# Patient Record
Sex: Male | Born: 1975 | Race: White | Hispanic: No | Marital: Married | Smoking: Never smoker
Health system: Southern US, Community
[De-identification: ages and names within clinical notes are randomized; demographics above are authoritative.]

## PROBLEM LIST (undated history)

## (undated) DIAGNOSIS — K219 Gastro-esophageal reflux disease without esophagitis: Secondary | ICD-10-CM

## (undated) DIAGNOSIS — E669 Obesity, unspecified: Secondary | ICD-10-CM

## (undated) DIAGNOSIS — J45909 Unspecified asthma, uncomplicated: Secondary | ICD-10-CM

## (undated) DIAGNOSIS — Z9109 Other allergy status, other than to drugs and biological substances: Secondary | ICD-10-CM

## (undated) DIAGNOSIS — K649 Unspecified hemorrhoids: Secondary | ICD-10-CM

## (undated) HISTORY — PX: WISDOM TOOTH EXTRACTION: SHX21

## (undated) HISTORY — DX: Unspecified hemorrhoids: K64.9

## (undated) HISTORY — DX: Obesity, unspecified: E66.9

## (undated) HISTORY — DX: Gastro-esophageal reflux disease without esophagitis: K21.9

## (undated) HISTORY — DX: Other allergy status, other than to drugs and biological substances: Z91.09

## (undated) HISTORY — DX: Unspecified asthma, uncomplicated: J45.909

---

## 2015-08-17 DIAGNOSIS — B351 Tinea unguium: Secondary | ICD-10-CM | POA: Diagnosis not present

## 2015-08-17 DIAGNOSIS — L308 Other specified dermatitis: Secondary | ICD-10-CM | POA: Diagnosis not present

## 2016-02-20 DIAGNOSIS — Z3189 Encounter for other procreative management: Secondary | ICD-10-CM | POA: Diagnosis not present

## 2016-05-21 DIAGNOSIS — J209 Acute bronchitis, unspecified: Secondary | ICD-10-CM | POA: Diagnosis not present

## 2016-05-25 DIAGNOSIS — Z20828 Contact with and (suspected) exposure to other viral communicable diseases: Secondary | ICD-10-CM | POA: Diagnosis not present

## 2016-05-25 DIAGNOSIS — R05 Cough: Secondary | ICD-10-CM | POA: Diagnosis not present

## 2016-05-25 DIAGNOSIS — J209 Acute bronchitis, unspecified: Secondary | ICD-10-CM | POA: Diagnosis not present

## 2016-08-15 DIAGNOSIS — R062 Wheezing: Secondary | ICD-10-CM | POA: Diagnosis not present

## 2016-08-15 DIAGNOSIS — J45990 Exercise induced bronchospasm: Secondary | ICD-10-CM | POA: Diagnosis not present

## 2016-08-15 DIAGNOSIS — J3089 Other allergic rhinitis: Secondary | ICD-10-CM | POA: Diagnosis not present

## 2016-08-15 DIAGNOSIS — J301 Allergic rhinitis due to pollen: Secondary | ICD-10-CM | POA: Diagnosis not present

## 2016-12-06 DIAGNOSIS — J3081 Allergic rhinitis due to animal (cat) (dog) hair and dander: Secondary | ICD-10-CM | POA: Diagnosis not present

## 2016-12-06 DIAGNOSIS — J45990 Exercise induced bronchospasm: Secondary | ICD-10-CM | POA: Diagnosis not present

## 2016-12-06 DIAGNOSIS — J3089 Other allergic rhinitis: Secondary | ICD-10-CM | POA: Diagnosis not present

## 2016-12-06 DIAGNOSIS — J301 Allergic rhinitis due to pollen: Secondary | ICD-10-CM | POA: Diagnosis not present

## 2017-01-10 DIAGNOSIS — Z3189 Encounter for other procreative management: Secondary | ICD-10-CM | POA: Diagnosis not present

## 2017-01-22 ENCOUNTER — Telehealth: Payer: Self-pay

## 2017-01-22 NOTE — Telephone Encounter (Signed)
Unable to reach patient at time of Pre Visit Call.   

## 2017-01-23 ENCOUNTER — Encounter: Payer: Self-pay | Admitting: Family Medicine

## 2017-01-23 ENCOUNTER — Ambulatory Visit (INDEPENDENT_AMBULATORY_CARE_PROVIDER_SITE_OTHER): Payer: BLUE CROSS/BLUE SHIELD | Admitting: Family Medicine

## 2017-01-23 VITALS — BP 204/126 | HR 95 | Temp 97.9°F | Resp 20 | Ht 69.75 in | Wt 396.2 lb

## 2017-01-23 DIAGNOSIS — R600 Localized edema: Secondary | ICD-10-CM | POA: Diagnosis not present

## 2017-01-23 DIAGNOSIS — R9431 Abnormal electrocardiogram [ECG] [EKG]: Secondary | ICD-10-CM

## 2017-01-23 DIAGNOSIS — Z6841 Body Mass Index (BMI) 40.0 and over, adult: Secondary | ICD-10-CM | POA: Insufficient documentation

## 2017-01-23 DIAGNOSIS — R0600 Dyspnea, unspecified: Secondary | ICD-10-CM | POA: Insufficient documentation

## 2017-01-23 DIAGNOSIS — Z7689 Persons encountering health services in other specified circumstances: Secondary | ICD-10-CM

## 2017-01-23 DIAGNOSIS — R0609 Other forms of dyspnea: Secondary | ICD-10-CM

## 2017-01-23 DIAGNOSIS — J45909 Unspecified asthma, uncomplicated: Secondary | ICD-10-CM | POA: Diagnosis not present

## 2017-01-23 DIAGNOSIS — I160 Hypertensive urgency: Secondary | ICD-10-CM

## 2017-01-23 DIAGNOSIS — I10 Essential (primary) hypertension: Secondary | ICD-10-CM | POA: Insufficient documentation

## 2017-01-23 DIAGNOSIS — Z800 Family history of malignant neoplasm of digestive organs: Secondary | ICD-10-CM | POA: Diagnosis not present

## 2017-01-23 HISTORY — DX: Family history of malignant neoplasm of digestive organs: Z80.0

## 2017-01-23 HISTORY — DX: Morbid (severe) obesity due to excess calories: E66.01

## 2017-01-23 HISTORY — DX: Essential (primary) hypertension: I10

## 2017-01-23 MED ORDER — ALBUTEROL SULFATE HFA 108 (90 BASE) MCG/ACT IN AERS: 2.0000 | INHALATION_SPRAY | Freq: Four times a day (QID) | RESPIRATORY_TRACT | 0 refills | Status: DC | PRN

## 2017-01-23 MED ORDER — IPRATROPIUM-ALBUTEROL 0.5-2.5 (3) MG/3ML IN SOLN
3.0000 mL | Freq: Once | RESPIRATORY_TRACT | Status: AC
Start: 2017-01-23 — End: 2017-01-23
  Administered 2017-01-23: 3 mL via RESPIRATORY_TRACT

## 2017-01-23 MED ORDER — AMLODIPINE BESYLATE 5 MG PO TABS: 5.0000 mg | ORAL_TABLET | Freq: Every day | ORAL | 0 refills | Status: DC

## 2017-01-23 MED ORDER — FUROSEMIDE 20 MG PO TABS: 20.0000 mg | ORAL_TABLET | Freq: Every day | ORAL | 0 refills | Status: DC

## 2017-01-23 NOTE — Patient Instructions (Addendum)
Baby aspirin (81 m) every day.  Start amlodipine 5 mg a day, this is not going to get you at goal... We need to decrease your pressure slowly (ideally in an ER). Please go to ER if any of the symptoms we discussed develop.  They will call you to schedule Echo of your heart.  Start lasix tomorrow morning.  [facility] x[facility].  Since you do not desire to go to emergency room, this will take multiple close visits and medication adjustments.     Hypertension Hypertension is another name for high blood pressure. High blood pressure forces your heart to work harder to pump blood. This can cause problems over time. There are two numbers in a blood pressure reading. There is a top number (systolic) over a bottom number (diastolic). It is best to have a blood pressure below 120/80. Healthy choices can help lower your blood pressure. You may need medicine to help lower your blood pressure if:  Your blood pressure cannot be lowered with healthy choices.  Your blood pressure is higher than 130/80.  Follow these instructions at home: Eating and drinking  If directed, follow the DASH eating plan. This diet includes: ? Filling half of your plate at each meal with fruits and vegetables. ? Filling one quarter of your plate at each meal with whole grains. Whole grains include whole wheat pasta, brown rice, and whole grain bread. ? Eating or drinking low-fat dairy products, such as skim milk or low-fat yogurt. ? Filling one quarter of your plate at each meal with low-fat (lean) proteins. Low-fat proteins include fish, skinless chicken, eggs, beans, and tofu. ? Avoiding fatty meat, cured and processed meat, or chicken with skin. ? Avoiding premade or processed food.  Eat less than 1,500 mg of salt (sodium) a day.  Limit alcohol use to no more than 1 drink a day for nonpregnant women and 2 drinks a day for men. One drink equals 12 oz of beer, 5 oz of wine, or 1 oz of hard  liquor. Lifestyle  Work with your doctor to stay at a healthy weight or to lose weight. Ask your doctor what the best weight is for you.  Get at least 30 minutes of exercise that causes your heart to beat faster (aerobic exercise) most days of the week. This may include walking, swimming, or biking.  Get at least 30 minutes of exercise that strengthens your muscles (resistance exercise) at least 3 days a week. This may include lifting weights or pilates.  Do not use any products that contain nicotine or tobacco. This includes cigarettes and e-cigarettes. If you need help quitting, ask your doctor.  Check your blood pressure at home as told by your doctor.  Keep all follow-up visits as told by your doctor. This is important. Medicines  Take over-the-counter and prescription medicines only as told by your doctor. Follow directions carefully.  Do not skip doses of blood pressure medicine. The medicine does not work as well if you skip doses. Skipping doses also puts you at risk for problems.  Ask your doctor about side effects or reactions to medicines that you should watch for. Contact a doctor if:  You think you are having a reaction to the medicine you are taking.  You have headaches that keep coming back (recurring).  You feel dizzy.  You have swelling in your ankles.  You have trouble with your vision. Get help right away if:  You get a very  bad headache.  You start to feel confused.  You feel weak or numb.  You feel faint.  You get very bad pain in your: ? Chest. ? Belly (abdomen).  You throw up (vomit) more than once.  You have trouble breathing. Summary  Hypertension is another name for high blood pressure.  Making healthy choices can help lower blood pressure. If your blood pressure cannot be controlled with healthy choices, you may need to take medicine. This information is not intended to replace advice given to you by your health care provider. Make sure  you discuss any questions you have with your health care provider. Document Released: 09/12/2007 Document Revised: 02/22/2016 Document Reviewed: 02/22/2016 Elsevier Interactive Patient Education  2018 [employer].   Ischemic Stroke An ischemic stroke (cerebrovascular accident, or CVA) is the sudden death of brain tissue that occurs when an area of the brain does not get enough oxygen. It is a medical emergency that must be treated right away. An ischemic stroke can cause permanent loss of brain function. This can cause problems with how different parts of your body function. What are the causes? This condition is caused by a decrease of oxygen supply to an area of the brain, which may be the result of:  A small blood clot (embolus) or a buildup of plaque in the blood vessels (atherosclerosis) that blocks blood flow in the brain.  An abnormal heart rhythm (atrial fibrillation).  A blocked or damaged artery in the head or neck.  What increases the risk? Certain factors may make you more likely to develop this condition. Some of these factors are things that you can change, such as:  Obesity.  Smoking cigarettes.  Taking oral birth control, especially if you also use tobacco.  Physical inactivity.  Excessive alcohol use.  Use of illegal drugs, especially cocaine and methamphetamine.  Other risk factors include:  High blood pressure (hypertension).  High cholesterol.  Diabetes mellitus.  Heart disease.  Being [place] American, Native [address], Hispanic, or [place] Native.  Being over age 34.  Family history of stroke.  Previous history of blood clots, stroke, or transient ischemic attack (TIA).  Sickle cell disease.  Being a woman with a history of preeclampsia.  Migraine headache.  Sleep apnea.  Irregular heartbeats, such as atrial fibrillation.  Chronic inflammatory diseases, such as rheumatoid arthritis or lupus.  Blood clotting disorders (hypercoagulable  state).  What are the signs or symptoms? Symptoms of this condition usually develop suddenly, or you may notice them after waking up from sleep. Symptoms may include sudden:  Weakness or numbness in your face, arm, or leg, especially on one side of your body.  Trouble walking or difficulty moving your arms or legs.  Loss of balance or coordination.  Confusion.  Slurred speech (dysarthria).  Trouble speaking, understanding speech, or both (aphasia).  Vision changes-such as double vision, blurred vision, or loss of vision-inone or both eyes.  Dizziness.  Nausea and vomiting.  Severe headache with no known cause. The headache is often described as the worst headache ever experienced.  If possible, make note of the exact time that you last felt like your normal self and what time your symptoms started. Tell your health care provider. If symptoms come and go, this could be a sign of a warning stroke, or TIA. Get help right away, even if you feel better. How is this diagnosed? This condition may be diagnosed based on:  Your symptoms, your medical history, and a physical exam.  CT scan of the brain.  MRI.  CT angiogram. This test uses a computer to take X-rays of your arteries. A dye may be injected into your blood to show the inside of your blood vessels more clearly.  MRI angiogram. This is a type of MRI that is used to evaluate the blood vessels.  Cerebral angiogram. This test uses X-rays and a dye to show the blood vessels in the brain and neck.  You may need to see a health care provider who specializes in stroke care. A stroke specialist can be seen in person or through communication using telephone or television technology (telemedicine). Other tests may also be done to find the cause of the stroke, such as:  Electrocardiogram (ECG).  Continuous heart monitoring.  Echocardiogram.  Carotid ultrasound.  A scan of the brain circulation.  Blood tests.  Sleep  study to check for sleep apnea.  How is this treated? Treatment for this condition will depend on the duration, severity, and cause of your symptoms and on the area of the brain affected. It is very important to get treatment at the first sign of stroke symptoms. Some treatments work better if they are done within 3-6 hours of the onset of stroke symptoms. These initial treatments may include:  Aspirin.  Medicines to control blood pressure.  Medicine given by injection to dissolve the blood clot (thrombolytic).  Treatments given directly to the affected artery to remove or dissolve the blood clot.  Other treatment options may include:  Oxygen.  IV fluids.  Medicines to thin the blood (anticoagulants or antiplatelets).  Procedures to increase blood flow.  Medicines and changes to your diet may be used to help treat and manage risk factors for stroke, such as diabetes, high cholesterol, and high blood pressure. After a stroke, you may work with physical, speech, mental health, or occupational therapists to help you recover. Follow these instructions at home: Medicines  Take over-the-counter and prescription medicines only as told by your health care provider.  If you were told to take a medicine to thin your blood, such as aspirin or an anticoagulant, take it exactly as told by your health care provider. ? Taking too much blood-thinning medicine can cause bleeding. ? If you do not take enough blood-thinning medicine, you will not have the protection that you need against another stroke and other problems.  Understand the side effects of taking anticoagulant medicine. When taking this type of medicine, make sure you: ? Hold pressure over any cuts for longer than usual. ? Tell your dentist and other health care providers that you are taking anticoagulants before you have any procedures that may cause bleeding. ? Avoid activities that may cause trauma or injury. Eating and  drinking  Follow instructions from your health care provider about diet.  Eat healthy foods.  If your ability to swallow was affected by the stroke, you may need to take steps to avoid choking, such as: ? Taking small bites when eating. ? Eating foods that are soft or pureed. Safety  Follow instructions from your health care team about physical activity.  Use a walker or cane as told by your health care provider.  Take steps to create a safe home environment in order to reduce the risk of falls. This may include: ? Having your home looked at by specialists. ? Installing grab bars in the bedroom and bathroom. ? Using safety equipment, such as raised toilets and a seat in the shower. General instructions  Do  not use any tobacco products, such as cigarettes, chewing tobacco, and e-cigarettes. If you need help quitting, ask your health care provider.  Limit alcohol intake to no more than 1 drink a day for nonpregnant women and 2 drinks a day for men. One drink equals 12 oz of beer, 5 oz of wine, or 1 oz of hard liquor.  If you need help to stop using drugs or alcohol, ask your health care provider about a referral to a program or specialist.  Maintain an active and healthy lifestyle. Get regular exercise as told by your health care provider.  Keep all follow-up visits as told by your health care provider, including visits with all specialists on your health care team. This is important. How is this prevented? Your risk of another stroke can be decreased by managing high blood pressure, high cholesterol, diabetes, heart disease, sleep apnea, and obesity. It can also be decreased by quitting smoking, limiting alcohol, and staying physically active. Your health care provider will continue to work with you on measures to prevent short-term and long-term complications of stroke. Get help right away if: You have:  Sudden weakness or numbness in your face, arm, or leg, especially on one  side of your body.  Sudden confusion.  Sudden trouble speaking, understanding, or both (aphasia).  Sudden trouble seeing with one or both eyes.  Sudden trouble walking or difficulty moving your arms or legs.  Sudden dizziness.  Sudden loss of balance or coordination.  Sudden, severe headache with no known cause.  A partial or total loss of consciousness.  A seizure. Any of these symptoms may represent a serious problem that is an emergency. Do not wait to see if the symptoms will go away. Get medical help right away. Call your local emergency services (911 in [place] Do not drive yourself to the hospital. This information is not intended to replace advice given to you by your health care provider. Make sure you discuss any questions you have with your health care provider. Document Released: 03/26/2005 Document Revised: 09/06/2015 Document Reviewed: 06/22/2015 Elsevier Interactive Patient Education  2017 [employer]    Acute Coronary Syndrome Acute coronary syndrome (ACS) is a serious problem in which there is suddenly not enough blood and oxygen supplied to the heart. ACS may mean that one or more of the blood vessels in your heart (coronary arteries) may be blocked. ACS can result in chest pain or a heart attack (myocardial infarction or MI). What are the causes? This condition is caused by atherosclerosis, which is the buildup of fat and cholesterol (plaque) on the inside of the arteries. Over time, the plaque may narrow or block the artery, and this will lessen blood flow to the heart. Plaque can also become weak and break off within a coronary artery to form a clot and cause a sudden blockage. What increases the risk? The risk factors of this condition include:  High cholesterol levels.  High blood pressure (hypertension).  Smoking.  Diabetes.  Age.  Family history of chest pain, heart disease, or stroke.  Lack of exercise.  What are the signs or symptoms? The  most common signs of this condition include:  Chest pain, which can be: ? A crushing or squeezing in the chest. ? A tightness, pressure, fullness, or heaviness in the chest. ? Present for more than a few minutes, or it can stop and recur.  Pain in the arms, neck, jaw, or back.  Unexplained heartburn or indigestion.  Shortness of  breath.  Nausea.  Sudden cold sweats.  Feeling light-headed or dizzy.  Sometimes, this condition has no symptoms. How is this diagnosed? ACS may be diagnosed through the following tests:  Electrocardiogram (ECG).  Blood tests.  Coronary angiogram. This is a procedure to look at the coronary arteries to see if there is any blockage.  How is this treated? Treatment for ACS may include:  Healthy behavioral changes to reduce or control risk factors.  Medicine.  Coronary stenting.A stent helps to keep an artery open.  Coronary angioplasty. This procedure widens a narrowed or blocked artery.  Coronary artery bypass surgery. This will allow your blood to pass the blockage (bypass) to reach your heart.  Follow these instructions at home: Eating and drinking  Follow a heart-healthy diet. A dietitian can you help to educate you about healthy food options and changes.  Use healthy cooking methods such as roasting, grilling, broiling, baking, poaching, steaming, or stir-frying. Talk to a dietitian to learn more about healthy cooking methods. Medicines  Take medicines only as directed by your health care provider.  Do not take the following medicines unless your health care provider approves: ? Nonsteroidal anti-inflammatory drugs (NSAIDs), such as ibuprofen, naproxen, or celecoxib. ? Vitamin supplements that contain vitamin A, vitamin E, or both. ? Hormone replacement therapy that contains estrogen with or without progestin.  Stop illegal drug use. Activity  Follow an exercise program that is approved by your health care provider.  Plan rest  periods when you are fatigued. Lifestyle  Do not use any tobacco products, including cigarettes, chewing tobacco, or electronic cigarettes. If you need help quitting, ask your health care provider.  If you drink alcohol, and your health care provider approves, limit your alcohol intake to no more than 1 drink per day. One drink equals 12 ounces of beer, 5 ounces of wine, or 1 ounces of hard liquor.  Learn to manage stress.  Maintain a healthy weight. Lose weight as approved by your health care provider. General instructions  Manage other health conditions, such as hypertension and diabetes, as directed by your health care provider.  Keep all follow-up visits as directed by your health care provider. This is important.  Your health care provider may ask you to monitor your blood pressure. A blood pressure reading consists of a higher number over a lower number, such as 110 over 72, written as 110/72. Ideally, your blood pressure should be: ? Below 140/90 if you have no other medical conditions. ? Below 130/80 if you have diabetes or kidney disease. Get help right away if:  You have pain in your chest, neck, arm, jaw, stomach, or back that lasts more than a few minutes, is recurring, or is not relieved by taking medicine under your tongue (sublingual nitroglycerin).  You have profuse sweating without cause.  You have unexplained: ? Heartburn or indigestion. ? Shortness of breath or difficulty breathing. ? Nausea or vomiting. ? Fatigue. ? Feelings of nervousness or anxiety. ? Weakness. ? Diarrhea.  You have sudden light-headedness or dizziness.  You faint. These symptoms may represent a serious problem that is an emergency. Do not wait to see if the symptoms will go away. Get medical help right away. Call your local emergency services (911 in the [place] Do not drive yourself to the clinic or hospital. This information is not intended to replace advice given to you by your health  care provider. Make sure you discuss any questions you have with your health care provider. Document  Released: 03/26/2005 Document Revised: 09/07/2015 Document Reviewed: 07/28/2013 Elsevier Interactive Patient Education  2017 [employer].

## 2017-01-23 NOTE — Progress Notes (Signed)
Patient ID: Darren Carlson, male  DOB: Aug 30, 1975, 41 y.o.   MRN: [id number] Patient Care Team    Relationship Specialty Notifications Start End  Ma Hillock, DO PCP - General Family Medicine  01/23/17     Chief Complaint  Patient presents with  . Establish Care  . Asthma    Subjective:  Maico Mulvehill is a 41 y.o.  male present for new patient establishment. All past medical history, surgical history, allergies, family history, immunizations, medications and social history were obtained and entered in the electronic medical record today. All recent labs, ED visits and hospitalizations within the last year were reviewed. Patient moved from [place] over the summer with his family.  Patient presents today with new patient establishment with reports of dyspnea on exertion that has increased since summer. He and his family have moved into a new home in the area, and they'll started to become ill. They had to have their duct work cleaned, carpets removed and house inspected. After these were completed he felt that he was improving, but still with symptoms. He was also treated for severe bronchitis at that time. No chest x-ray obtained. He has never had blood pressure issues in the past. He states it has been borderline at times but not consistently high. He has been exercising pretty routinely without chest pain, dizziness, syncope or headache. He admits to lower extremity edema and dyspnea on exertion. He contributed to his allergies and asthma. He has been seeing an asthma specialist and started on Qvar 80 1 puff twice a day. He was also started on Prilosec, which he states he is not taking. He was investigated chest x-ray, but admits he has not done so yet. He is using dymista and Flonase, he is not taking a oral allergy regimen. He is aware he is extremely overweight, and he has been working out attempting to lose weight. He reports he has steadily lost inches over the last few months. He  brings no records with him today, and his had no recent work to report.  Family history colon cancer : Patient has a rather significant family history of colon cancer in his grandmother in her early 35s, and his aunt in her 44s.   Depression screen PHQ 2/9 01/23/2017  Decreased Interest 0  Down, Depressed, Hopeless 0  PHQ - 2 Score 0   No flowsheet data found.    Current Exercise Habits: Structured exercise class, Type of exercise: strength training/weights, Time (Minutes): 40, Frequency (Times/Week): 2, Weekly Exercise (Minutes/Week): 80, Intensity: Mild   No flowsheet data found.    There is no immunization history on file for this patient.  No exam data present  Past Medical History:  Diagnosis Date  . Asthma   . Environmental allergies   . GERD (gastroesophageal reflux disease)   . Obesity    Allergies  Allergen Reactions  . Amoxicillin Rash  . Penicillins Rash   Past Surgical History:  Procedure Laterality Date  . WISDOM TOOTH EXTRACTION     Family History  Problem Relation Age of Onset  . Colon cancer Maternal Grandmother 37       early death  . Diabetes Paternal Grandmother   . Kidney disease Paternal Grandmother   . Diabetes Paternal Grandfather   . Kidney disease Paternal Grandfather   . Colon cancer Maternal Aunt 61   Social History   Social History  . Marital status: Married    Spouse name: [name]  . Number of  children: 2  . Years of education: 37   Occupational History  . restaurant owner    Social History Main Topics  . Smoking status: Never Smoker  . Smokeless tobacco: Never Used  . Alcohol use Yes     Comment: occasional  . Drug use: No  . Sexual activity: Yes    Partners: Female    Birth control/ protection: None   Other Topics Concern  . Not on file   Social History Narrative   Married to Hughson, 2 children.    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 Drinks caffeine.    Smoke alarm in  the home. Wears seatbelt.    Feels safe in his relationships.       Allergies as of 01/23/2017      Reactions   Amoxicillin Rash   Penicillins Rash      Medication List       Accurate as of 01/23/17  5:49 PM. Always use your most recent med list.          albuterol 108 (90 Base) MCG/ACT inhaler Commonly known as:  PROVENTIL HFA;VENTOLIN HFA Inhale 2 puffs into the lungs every 6 (six) hours as needed for wheezing or shortness of breath.   amLODipine 5 MG tablet Commonly known as:  NORVASC Take 1 tablet (5 mg total) by mouth daily.   DYMISTA 137-50 MCG/ACT Susp Generic drug:  Azelastine-Fluticasone USE 1 SPRAY INTO EACH NOSTRIL TWICE A DAY   furosemide 20 MG tablet Commonly known as:  LASIX Take 1 tablet (20 mg total) by mouth daily.   omeprazole 20 MG capsule Commonly known as:  PRILOSEC TAKE 1 CAPSULE ONCE A DAY 1/2 HOUR PRIOR TO BREAKFAST ORALLY 30 DAY(S)   QVAR REDIHALER 80 MCG/ACT inhaler Generic drug:  beclomethasone TAKE 1 PUFF BY MOUTH TWICE A DAY       All past medical history, surgical history, allergies, family history, immunizations andmedications were updated in the EMR today and reviewed under the history and medication portions of their EMR.    No results found for this or any previous visit (from the past 2160 hour(s)).  Patient was never admitted.   ROS: 14 pt review of systems performed and negative (unless mentioned in an HPI)  Objective: BP (!) 204/126 (BP Location: Right Arm, Patient Position: Sitting, Cuff Size: Large)   Pulse 95   Temp 97.9 F (36.6 C)   Resp 20   Ht 5' 9.75" (1.772 m)   Wt (!) 396 lb 4 oz (179.7 kg)   SpO2 96%   BMI 57.26 kg/m  Gen: Afebrile. No acute distress. Nontoxic in appearance, well-developed, well-nourished,  Morbidly obese, pleasant Caucasian male. HENT: AT. [place]. MMM, no oral lesions, adequate dentition. Bilateral nares within normal limits. Throat without erythema, ulcerations or exudates. Mild Cough on  exam, no hoarseness on exam. Eyes:Pupils Equal Round Reactive to light, Extraocular movements intact,  Conjunctiva without redness, discharge or icterus. Neck/lymp/endocrine: Supple, no lymphadenopathy, no thyromegaly CV: RRR, No murmur, +2 edema. no carotid bruits, JVD. Chest: CTAB, no wheeze, rhonchi or crackles. Normal Respiratory effort. Diminished Air movement. Abd: Soft. Obese. NTND. BS present.  Skin: Warm and well-perfused. Skin intact. Hyperpigmentation bilateral ankles/lower extremity. Neuro/Msk:  Normal gait. PERLA. EOMi. Alert. Oriented x3.   Psych: Normal affect, dress and demeanor. Normal speech. Normal thought content and judgment. EKG: SR.  Heart rate 100. PR 154, QT 386. Nonspecific T-wave out normality. No ST elevations. Atrial enlargement. Right-sided  conduction defect, right axis, possible right ventricular hypertrophy or posterior fascicular block  Assessment/plan: Orlando Devereux is a 41 y.o. male present for establishment visit with  Dyspnea on exertion/Hypertensive urgency/Abnormal EKG/Morbid obesity with BMI of 50.0-59.9, adult [facility]) Patient presents with complaints of dyspnea on exertion with an new onset elevated blood pressure ranging in hypertensive urgency criteria. He was asymptomatic, with the exception of his shortness of breath with exertion. He actually has been exercising a few times a week and has tolerated. Patient is fluid overloaded on exam today. Strongly encouraged him to be treated in the emergency room for hypertension urgency and dyspnea on exertion, and he declined. Patient was educated blood pressure needs to be slowly decreased and this is difficult to do in outpatient setting, with someone who is never been on blood pressure medications. He will need very close follow-up, labs, echocardiogram and chest x-ray. Will refer to cardiology for abnormal EKG after echocardiogram is completed. - low sodium diet, avoid caffeine.  - exercise routinely, after BP  better controlled. Consider Contrave if interested in weight loss assistance. - Start amlodipine 5 mg daily today. Patient to be seen in 2 days provider appointment and will increase at that time. - Start Lasix 20 mg daily tomorrow morning. - Will eventually need sleep study after blood pressure and fluid overload better controlled. - CBC w/Diff - HgB A1c - Comp Met (CMET) - TSH - DG Chest 2 View; Future - ECHOCARDIOGRAM COMPLETE; Future - Direct LDL - EKG 12-Lead--> abnormal. Echo ordered. Referral to cardio for full DOE evaluation and likely stress test. Given he has declined to be seen in ED, will attempt to obtain studies outpt quickly and have BP better controlled before cardio eval.  - Patient was educated on his abnormalities of his EKG, blood pressure and fluid retention with dyspnea on exertion can cause a strain to the heart causing heart attack or stroke. Patient is aware of the signs and symptoms and will report to the emergency room if needed. Strongly encouraged him to take his condition seriously, as it can be life threatening.  Moderate asthma without complication, unspecified whether persistent - Patient's dyspnea on exertion is felt to be due to uncontrolled hypertension, fluid overload and likely heart disease. However he does have some component of asthma which responded to an albuterol treatment in the office today. - Continue Flonase - Continue Qvar 1 puff twice a day, may need to increase to 2 puffs twice a day. Patient's blood pressure and fluid status needs to be corrected, then can revisit his response to Qvar. - May need to consider Xyzal and Singulair addition. - Albuterol inhaler prescribed patient is to use before exercise and when necessary for wheezing or shortness of breath. - ipratropium-albuterol (DUONEB) 0.5-2.5 (3) MG/3ML nebulizer solution 3 mL; Take 3 mLs by nebulization once.  Family history colon cancer: Patient will need early screening should be  completed as soon as possible. Discussed referral with him today and we will place after cardiac condition is better controlled.   Return in about 2 days (around 01/25/2017) for HTN'.  Greater than 60 minutes was spent with patient, greater than 50% of that time was spent face-to-face with patient counseling and/or coordinating care.    Note is dictated utilizing voice recognition software. Although note has been proof read prior to signing, occasional typographical errors still can be missed. If any questions arise, please do not hesitate to call for verification.  Electronically signed by: Felix Pacini, DO [place]  [facility] [place]

## 2017-01-24 ENCOUNTER — Telehealth: Payer: Self-pay | Admitting: Family Medicine

## 2017-01-24 ENCOUNTER — Ambulatory Visit (HOSPITAL_BASED_OUTPATIENT_CLINIC_OR_DEPARTMENT_OTHER)
Admission: RE | Admit: 2017-01-24 | Discharge: 2017-01-24 | Disposition: A | Discharge status: Home / Self Care | Payer: BLUE CROSS/BLUE SHIELD | Source: Ambulatory Visit | Attending: Family Medicine | Admitting: Family Medicine

## 2017-01-24 DIAGNOSIS — I517 Cardiomegaly: Secondary | ICD-10-CM | POA: Insufficient documentation

## 2017-01-24 DIAGNOSIS — R600 Localized edema: Secondary | ICD-10-CM | POA: Insufficient documentation

## 2017-01-24 DIAGNOSIS — I160 Hypertensive urgency: Secondary | ICD-10-CM | POA: Insufficient documentation

## 2017-01-24 DIAGNOSIS — R0609 Other forms of dyspnea: Secondary | ICD-10-CM | POA: Insufficient documentation

## 2017-01-24 DIAGNOSIS — R0602 Shortness of breath: Secondary | ICD-10-CM | POA: Diagnosis not present

## 2017-01-24 DIAGNOSIS — R0600 Dyspnea, unspecified: Secondary | ICD-10-CM

## 2017-01-24 LAB — HEMOGLOBIN A1C
HEMOGLOBIN A1C: 6.4 %{Hb} — AB (ref <5.7)
Mean Plasma Glucose: 137 (calc)
eAG (mmol/L): 7.6 (calc)

## 2017-01-24 LAB — COMPREHENSIVE METABOLIC PANEL
AG RATIO: 1.7 (calc) (ref 1.0–2.5)
ALT: 20 U/L (ref 9–46)
AST: 18 U/L (ref 10–40)
Albumin: 4.4 g/dL (ref 3.6–5.1)
Alkaline phosphatase (APISO): 45 U/L (ref 40–115)
BUN: 19 mg/dL (ref 7–25)
CALCIUM: 9.6 mg/dL (ref 8.6–10.3)
CHLORIDE: 99 mmol/L (ref 98–110)
CO2: 32 mmol/L (ref 20–32)
Creat: 1.34 mg/dL (ref 0.60–1.35)
GLUCOSE: 127 mg/dL — AB (ref 65–99)
Globulin: 2.6 g/dL (calc) (ref 1.9–3.7)
Potassium: 3.3 mmol/L — ABNORMAL LOW (ref 3.5–5.3)
Sodium: 139 mmol/L (ref 135–146)
TOTAL PROTEIN: 7 g/dL (ref 6.1–8.1)
Total Bilirubin: 1.3 mg/dL — ABNORMAL HIGH (ref 0.2–1.2)

## 2017-01-24 LAB — CBC WITH DIFFERENTIAL/PLATELET
BASOS PCT: 0.4 %
Basophils Absolute: 45 cells/uL (ref 0–200)
EOS ABS: 147 {cells}/uL (ref 15–500)
Eosinophils Relative: 1.3 %
HCT: 37 % — ABNORMAL LOW (ref 38.5–50.0)
HEMOGLOBIN: 12.4 g/dL — AB (ref 13.2–17.1)
LYMPHS ABS: 1458 {cells}/uL (ref 850–3900)
MCH: 26.8 pg — AB (ref 27.0–33.0)
MCHC: 33.5 g/dL (ref 32.0–36.0)
MCV: 80.1 fL (ref 80.0–100.0)
MONOS PCT: 5 %
MPV: 12.2 fL (ref 7.5–12.5)
NEUTROS ABS: 9085 {cells}/uL — AB (ref 1500–7800)
Neutrophils Relative %: 80.4 %
Platelets: 199 10*3/uL (ref 140–400)
RBC: 4.62 10*6/uL (ref 4.20–5.80)
RDW: 14.9 % (ref 11.0–15.0)
Total Lymphocyte: 12.9 %
WBC: 11.3 10*3/uL — ABNORMAL HIGH (ref 3.8–10.8)
WBCMIX: 565 {cells}/uL (ref 200–950)

## 2017-01-24 LAB — TSH: TSH: 2.13 m[IU]/L (ref 0.40–4.50)

## 2017-01-24 LAB — LDL CHOLESTEROL, DIRECT: LDL DIRECT: 157 mg/dL — AB (ref <100)

## 2017-01-24 MED ORDER — POTASSIUM CHLORIDE ER 20 MEQ PO TBCR: EXTENDED_RELEASE_TABLET | ORAL | 0 refills | Status: DC

## 2017-01-24 NOTE — Telephone Encounter (Signed)
Please call pt: - I have started to receive his labs and cxr results. We will discucss everything in detail tomorrow. However his potassium is low, and the lasix will make it lower, so we need to start a potassium supplement today. I have called this medication in for him.

## 2017-01-24 NOTE — Telephone Encounter (Signed)
Left message with information and instructions on patient voice mail. 

## 2017-01-25 ENCOUNTER — Encounter: Payer: Self-pay | Admitting: Family Medicine

## 2017-01-25 ENCOUNTER — Ambulatory Visit (INDEPENDENT_AMBULATORY_CARE_PROVIDER_SITE_OTHER): Payer: BLUE CROSS/BLUE SHIELD | Admitting: Family Medicine

## 2017-01-25 ENCOUNTER — Emergency Department (HOSPITAL_BASED_OUTPATIENT_CLINIC_OR_DEPARTMENT_OTHER)
Admission: EM | Admit: 2017-01-25 | Discharge: 2017-01-25 | Disposition: A | Discharge status: Home / Self Care | Payer: BLUE CROSS/BLUE SHIELD | Attending: Emergency Medicine | Admitting: Emergency Medicine

## 2017-01-25 VITALS — BP 209/122 | HR 93 | Temp 98.3°F | Resp 20 | Ht 70.0 in | Wt 392.0 lb

## 2017-01-25 DIAGNOSIS — R0600 Dyspnea, unspecified: Secondary | ICD-10-CM

## 2017-01-25 DIAGNOSIS — I517 Cardiomegaly: Secondary | ICD-10-CM

## 2017-01-25 DIAGNOSIS — E7800 Pure hypercholesterolemia, unspecified: Secondary | ICD-10-CM | POA: Diagnosis not present

## 2017-01-25 DIAGNOSIS — I160 Hypertensive urgency: Secondary | ICD-10-CM | POA: Diagnosis not present

## 2017-01-25 DIAGNOSIS — R9431 Abnormal electrocardiogram [ECG] [EKG]: Secondary | ICD-10-CM

## 2017-01-25 DIAGNOSIS — J45909 Unspecified asthma, uncomplicated: Secondary | ICD-10-CM | POA: Insufficient documentation

## 2017-01-25 DIAGNOSIS — I10 Essential (primary) hypertension: Secondary | ICD-10-CM

## 2017-01-25 DIAGNOSIS — E876 Hypokalemia: Secondary | ICD-10-CM | POA: Insufficient documentation

## 2017-01-25 DIAGNOSIS — Z7984 Long term (current) use of oral hypoglycemic drugs: Secondary | ICD-10-CM | POA: Insufficient documentation

## 2017-01-25 DIAGNOSIS — Z7982 Long term (current) use of aspirin: Secondary | ICD-10-CM | POA: Insufficient documentation

## 2017-01-25 DIAGNOSIS — Z79899 Other long term (current) drug therapy: Secondary | ICD-10-CM | POA: Insufficient documentation

## 2017-01-25 DIAGNOSIS — R7303 Prediabetes: Secondary | ICD-10-CM

## 2017-01-25 DIAGNOSIS — Z6841 Body Mass Index (BMI) 40.0 and over, adult: Secondary | ICD-10-CM

## 2017-01-25 DIAGNOSIS — R0609 Other forms of dyspnea: Secondary | ICD-10-CM

## 2017-01-25 DIAGNOSIS — R600 Localized edema: Secondary | ICD-10-CM | POA: Diagnosis not present

## 2017-01-25 LAB — MICROALBUMIN / CREATININE URINE RATIO
Creatinine,U: 247.1 mg/dL
MICROALB/CREAT RATIO: 43.3 mg/g — AB (ref 0.0–30.0)
Microalb, Ur: 106.9 mg/dL — ABNORMAL HIGH (ref 0.0–1.9)

## 2017-01-25 LAB — BRAIN NATRIURETIC PEPTIDE: Pro B Natriuretic peptide (BNP): 302 pg/mL — ABNORMAL HIGH (ref 0.0–100.0)

## 2017-01-25 MED ORDER — METFORMIN HCL 500 MG PO TABS: 500.0000 mg | ORAL_TABLET | Freq: Two times a day (BID) | ORAL | 1 refills | Status: DC

## 2017-01-25 MED ORDER — AZITHROMYCIN 250 MG PO TABS: ORAL_TABLET | ORAL | 0 refills | Status: DC

## 2017-01-25 MED ORDER — CLONIDINE HCL 0.1 MG PO TABS
0.2000 mg | ORAL_TABLET | Freq: Once | ORAL | Status: AC
  Administered 2017-01-25: 0.2 mg via ORAL
  Filled 2017-01-25: qty 2

## 2017-01-25 MED ORDER — AMLODIPINE BESYLATE 10 MG PO TABS: 10.0000 mg | ORAL_TABLET | Freq: Every day | ORAL | 1 refills | Status: DC

## 2017-01-25 MED ORDER — LISINOPRIL 20 MG PO TABS: 20.0000 mg | ORAL_TABLET | Freq: Every day | ORAL | 3 refills | Status: DC

## 2017-01-25 NOTE — ED Provider Notes (Signed)
[facility] EMERGENCY DEPARTMENT Provider Note   CSN: [id number] date & time: 01/25/17  1613     History   Chief Complaint Chief Complaint  Patient presents with  . Hypertension    HPI Darren Carlson is a 41 y.o. male.  HPI 41 year old male who presents with elevated blood pressure. He has a history of asthma, GERD and obesity. He has been receiving outpatient management for his high blood pressure this week through his primary care doctor's office. They started him on Lasix 40 mg by mouth and 5 mg amlodipine 2 days ago for elevated blood pressures in the 180s to 200s systolic.He returned to the office today and his blood pressure was 200 systolic over 120s and he was sent to the ED for evaluation. He was also is increased on the amlodipine to 10 mg and started on lisinopril 10 mg. States that he has had a mild headache but denies any nausea vomiting, focal numbness weakness, difficulty breathing, chest pain, difficulty with urination, lower extremity edema.  Had normal CXR and blood work 2 days ago per patient.   Past Medical History:  Diagnosis Date  . Asthma   . Environmental allergies   . GERD (gastroesophageal reflux disease)   . Obesity     Patient Active Problem List   Diagnosis Date Noted  . Cardiomegaly 01/25/2017  . Prediabetes 01/25/2017  . Hypokalemia 01/25/2017  . Morbid obesity with BMI of 50.0-59.9, adult (HCC) 01/23/2017  . Dyspnea on exertion 01/23/2017  . Hypertensive urgency 01/23/2017  . Abnormal EKG 01/23/2017  . Bilateral lower extremity edema 01/23/2017  . Family history of colon cancer 01/23/2017  . Moderate asthma without complication     Past Surgical History:  Procedure Laterality Date  . WISDOM TOOTH EXTRACTION         Home Medications    Prior to Admission medications   Medication Sig Start Date End Date Taking? Authorizing Provider  albuterol (PROVENTIL HFA;VENTOLIN HFA) 108 (90 Base) MCG/ACT inhaler Inhale 2  puffs into the lungs every 6 (six) hours as needed for wheezing or shortness of breath. 01/23/17   Kuneff, Renee A, DO  amLODipine (NORVASC) 10 MG tablet Take 1 tablet (10 mg total) by mouth daily. 01/25/17   Kuneff, Renee A, DO  aspirin EC 81 MG tablet Take 81 mg by mouth daily.    [provider]  azithromycin (ZITHROMAX) 250 MG tablet 500 mg , 250 mg QD 01/25/17   Kuneff, Renee A, DO  DYMISTA 137-50 MCG/ACT SUSP USE 1 SPRAY INTO EACH NOSTRIL TWICE A DAY 12/07/16   [provider]  furosemide (LASIX) 20 MG tablet Take 1 tablet (20 mg total) by mouth daily. 01/23/17   Kuneff, Renee A, DO  lisinopril (PRINIVIL,ZESTRIL) 20 MG tablet Take 1 tablet (20 mg total) by mouth daily. 01/25/17   Kuneff, Renee A, DO  metFORMIN (GLUCOPHAGE) 500 MG tablet Take 1 tablet (500 mg total) by mouth 2 (two) times daily with a meal. 01/25/17   Kuneff, Renee A, DO  omeprazole (PRILOSEC) 20 MG capsule TAKE 1 CAPSULE ONCE A DAY 1/2 HOUR PRIOR TO BREAKFAST ORALLY 30 DAY(S) 12/06/16   [provider]  potassium chloride 20 MEQ TBCR 2 tabs daily for 4 days, then one tab daily. 01/24/17   Kuneff, Renee A, DO  QVAR REDIHALER 80 MCG/ACT inhaler TAKE 1 PUFF BY MOUTH TWICE A DAY 12/18/16   [provider]    Family History Family History  Problem Relation Age  of Onset  . Colon cancer Maternal Grandmother 60       early death  . Diabetes Paternal Grandmother   . Kidney disease Paternal Grandmother   . Diabetes Paternal Grandfather   . Kidney disease Paternal Grandfather   . Colon cancer Maternal Aunt 83    Social History Social History  Substance Use Topics  . Smoking status: Never Smoker  . Smokeless tobacco: Never Used  . Alcohol use Yes     Comment: occasional     Allergies   Amoxicillin and Penicillins   Review of Systems Review of Systems  Constitutional: Negative for fever.  Respiratory: Negative for shortness of breath.   Cardiovascular: Negative for chest pain.    Gastrointestinal: Negative for abdominal pain.  All other systems reviewed and are negative.    Physical Exam Updated Vital Signs BP (!) 155/87   Pulse 79   Temp 98.4 F (36.9 C) (Oral)   Resp (!) 23   Ht 5\' 10"  (1.778 m)   Wt (!) 177.8 kg (392 lb)   SpO2 97%   BMI 56.25 kg/m   Physical Exam Physical Exam  Nursing note and vitals reviewed. Constitutional: Well developed, well nourished, non-toxic, and in no acute distress Head: Normocephalic and atraumatic.  Mouth/Throat: Oropharynx is clear and moist.  Neck: Normal range of motion. Neck supple.  Cardiovascular: Normal rate and regular rhythm.   Pulmonary/Chest: Effort normal and breath sounds normal.  Abdominal: Soft. Obese There is no tenderness. There is no rebound and no guarding.  Musculoskeletal: Normal range of motion.  Neurological: Alert, no facial droop, fluent speech, moves all extremities symmetrically Skin: Skin is warm and dry.  Psychiatric: Cooperative   ED Treatments / Results  Labs (all labs ordered are listed, but only abnormal results are displayed) Labs Reviewed - No data to display  EKG  EKG Interpretation  Date/Time:  Friday January 25 2017 16:26:36 EDT Ventricular Rate:  85 PR Interval:  152 QRS Duration: 88 QT Interval:  412 QTC Calculation: 490 R Axis:   -61 Text Interpretation:  Normal sinus rhythm Possible Left atrial enlargement Left axis deviation Abnormal ECG Confirmed by Crista Curb [phone]) on 01/25/2017 7:45:49 PM       Radiology Dg Chest 2 View  Result Date: 01/24/2017 CLINICAL DATA:  Shortness of breath. EXAM: CHEST  2 VIEW COMPARISON:  None. FINDINGS: Mild cardiomegaly is noted. No pneumothorax or pleural effusion is noted. Both lungs are clear. The visualized skeletal structures are unremarkable. IMPRESSION: Mild cardiomegaly.  No acute cardiopulmonary abnormality seen. Electronically Signed   By: Lupita Raider, M.D.   On: 01/24/2017 10:21    Procedures Procedures  (including critical care time)  Medications Ordered in ED Medications  cloNIDine (CATAPRES) tablet 0.2 mg (0.2 mg Oral Given 01/25/17 1828)     Initial Impression / Assessment and Plan / ED Course  I have reviewed the triage vital signs and the nursing notes.  Pertinent labs & imaging results that were available during my care of the patient were reviewed by me and considered in my medical decision making (see chart for details).     Presenting with elevated blood pressures. He has been getting management by the PCP just increased on his blood pressure medications earlier today. I suspect that he needs longer period of time until his blood pressure medications kick in. This time he is overall asymptomatic. He has a follow-up appointment on Tuesday with the cardiologist and primary care doctor for recheck. They can  continue to manage his blood pressure. Given single dose clonidine on arrival, improved BP. Will discharge home Strict return and follow-up instructions reviewed. He expressed understanding of all discharge instructions and felt comfortable with the plan of care.   Final Clinical Impressions(s) / ED Diagnoses   Final diagnoses:  Essential hypertension    New Prescriptions New Prescriptions   No medications on file     Lavera Guise, MD 01/25/17 2011

## 2017-01-25 NOTE — ED Triage Notes (Signed)
Saw his MD today for new onset HTN diagnosed last week. His BP was elevated in the office again today and he was told to come to the ED for further evaluation. More new medications were added today. He is very anxious on arrival to triage. Slight headache is his only symptom. 1/10.

## 2017-01-25 NOTE — Telephone Encounter (Signed)
Note opened in error.

## 2017-01-25 NOTE — Patient Instructions (Addendum)
Increase amlodipine to a total of 10 mg (together) a day today. Start lisinopril 1 tab daily tomorrow.  Lasix every 12 hours for 3 days, then once a day only.  Potassium supplement every 12 hours for 4 days then once a day. Start metformin every 12 hours.   Fasting labs and followup recommended Monday or Tuesday next week.    Of course any worsening symptoms, shortness of breath, chest pain, headache dizziness etc go straight to ED.   Recommendations are you go to ED now to be treated appropriately for your hypertensive urgency.    Acute Coronary Syndrome Acute coronary syndrome (ACS) is a serious problem in which there is suddenly not enough blood and oxygen supplied to the heart. ACS may mean that one or more of the blood vessels in your heart (coronary arteries) may be blocked. ACS can result in chest pain or a heart attack (myocardial infarction or MI). What are the causes? This condition is caused by atherosclerosis, which is the buildup of fat and cholesterol (plaque) on the inside of the arteries. Over time, the plaque may narrow or block the artery, and this will lessen blood flow to the heart. Plaque can also become weak and break off within a coronary artery to form a clot and cause a sudden blockage. What increases the risk? The risk factors of this condition include:  High cholesterol levels.  High blood pressure (hypertension).  Smoking.  Diabetes.  Age.  Family history of chest pain, heart disease, or stroke.  Lack of exercise.  What are the signs or symptoms? The most common signs of this condition include:  Chest pain, which can be: ? A crushing or squeezing in the chest. ? A tightness, pressure, fullness, or heaviness in the chest. ? Present for more than a few minutes, or it can stop and recur.  Pain in the arms, neck, jaw, or back.  Unexplained heartburn or indigestion.  Shortness of breath.  Nausea.  Sudden cold sweats.  Feeling light-headed or  dizzy.  Sometimes, this condition has no symptoms. How is this diagnosed? ACS may be diagnosed through the following tests:  Electrocardiogram (ECG).  Blood tests.  Coronary angiogram. This is a procedure to look at the coronary arteries to see if there is any blockage.  How is this treated? Treatment for ACS may include:  Healthy behavioral changes to reduce or control risk factors.  Medicine.  Coronary stenting.A stent helps to keep an artery open.  Coronary angioplasty. This procedure widens a narrowed or blocked artery.  Coronary artery bypass surgery. This will allow your blood to pass the blockage (bypass) to reach your heart.  Follow these instructions at home: Eating and drinking  Follow a heart-healthy diet. A dietitian can you help to educate you about healthy food options and changes.  Use healthy cooking methods such as roasting, grilling, broiling, baking, poaching, steaming, or stir-frying. Talk to a dietitian to learn more about healthy cooking methods. Medicines  Take medicines only as directed by your health care provider.  Do not take the following medicines unless your health care provider approves: ? Nonsteroidal anti-inflammatory drugs (NSAIDs), such as ibuprofen, naproxen, or celecoxib. ? Vitamin supplements that contain vitamin A, vitamin E, or both. ? Hormone replacement therapy that contains estrogen with or without progestin.  Stop illegal drug use. Activity  Follow an exercise program that is approved by your health care provider.  Plan rest periods when you are fatigued. Lifestyle  Do not use any tobacco  products, including cigarettes, chewing tobacco, or electronic cigarettes. If you need help quitting, ask your health care provider.  If you drink alcohol, and your health care provider approves, limit your alcohol intake to no more than 1 drink per day. One drink equals 12 ounces of beer, 5 ounces of wine, or 1 ounces of hard  liquor.  Learn to manage stress.  Maintain a healthy weight. Lose weight as approved by your health care provider. General instructions  Manage other health conditions, such as hypertension and diabetes, as directed by your health care provider.  Keep all follow-up visits as directed by your health care provider. This is important.  Your health care provider may ask you to monitor your blood pressure. A blood pressure reading consists of a higher number over a lower number, such as 110 over 72, written as 110/72. Ideally, your blood pressure should be: ? Below 140/90 if you have no other medical conditions. ? Below 130/80 if you have diabetes or kidney disease. Get help right away if:  You have pain in your chest, neck, arm, jaw, stomach, or back that lasts more than a few minutes, is recurring, or is not relieved by taking medicine under your tongue (sublingual nitroglycerin).  You have profuse sweating without cause.  You have unexplained: ? Heartburn or indigestion. ? Shortness of breath or difficulty breathing. ? Nausea or vomiting. ? Fatigue. ? Feelings of nervousness or anxiety. ? Weakness. ? Diarrhea.  You have sudden light-headedness or dizziness.  You faint. These symptoms may represent a serious problem that is an emergency. Do not wait to see if the symptoms will go away. Get medical help right away. Call your local emergency services (911 in the [place] Do not drive yourself to the clinic or hospital. This information is not intended to replace advice given to you by your health care provider. Make sure you discuss any questions you have with your health care provider. Document Released: 03/26/2005 Document Revised: 09/07/2015 Document Reviewed: 07/28/2013 Elsevier Interactive Patient Education  2017 [employer].   Ischemic Stroke An ischemic stroke (cerebrovascular accident, or CVA) is the sudden death of brain tissue that occurs when an area of the brain does not  get enough oxygen. It is a medical emergency that must be treated right away. An ischemic stroke can cause permanent loss of brain function. This can cause problems with how different parts of your body function. What are the causes? This condition is caused by a decrease of oxygen supply to an area of the brain, which may be the result of:  A small blood clot (embolus) or a buildup of plaque in the blood vessels (atherosclerosis) that blocks blood flow in the brain.  An abnormal heart rhythm (atrial fibrillation).  A blocked or damaged artery in the head or neck.  What increases the risk? Certain factors may make you more likely to develop this condition. Some of these factors are things that you can change, such as:  Obesity.  Smoking cigarettes.  Taking oral birth control, especially if you also use tobacco.  Physical inactivity.  Excessive alcohol use.  Use of illegal drugs, especially cocaine and methamphetamine.  Other risk factors include:  High blood pressure (hypertension).  High cholesterol.  Diabetes mellitus.  Heart disease.  Being [place] American, Native [address], Hispanic, or [place] Native.  Being over age 60.  Family history of stroke.  Previous history of blood clots, stroke, or transient ischemic attack (TIA).  Sickle cell disease.  Being a  woman with a history of preeclampsia.  Migraine headache.  Sleep apnea.  Irregular heartbeats, such as atrial fibrillation.  Chronic inflammatory diseases, such as rheumatoid arthritis or lupus.  Blood clotting disorders (hypercoagulable state).  What are the signs or symptoms? Symptoms of this condition usually develop suddenly, or you may notice them after waking up from sleep. Symptoms may include sudden:  Weakness or numbness in your face, arm, or leg, especially on one side of your body.  Trouble walking or difficulty moving your arms or legs.  Loss of balance or  coordination.  Confusion.  Slurred speech (dysarthria).  Trouble speaking, understanding speech, or both (aphasia).  Vision changes-such as double vision, blurred vision, or loss of vision-inone or both eyes.  Dizziness.  Nausea and vomiting.  Severe headache with no known cause. The headache is often described as the worst headache ever experienced.  If possible, make note of the exact time that you last felt like your normal self and what time your symptoms started. Tell your health care provider. If symptoms come and go, this could be a sign of a warning stroke, or TIA. Get help right away, even if you feel better. How is this diagnosed? This condition may be diagnosed based on:  Your symptoms, your medical history, and a physical exam.  CT scan of the brain.  MRI.  CT angiogram. This test uses a computer to take X-rays of your arteries. A dye may be injected into your blood to show the inside of your blood vessels more clearly.  MRI angiogram. This is a type of MRI that is used to evaluate the blood vessels.  Cerebral angiogram. This test uses X-rays and a dye to show the blood vessels in the brain and neck.  You may need to see a health care provider who specializes in stroke care. A stroke specialist can be seen in person or through communication using telephone or television technology (telemedicine). Other tests may also be done to find the cause of the stroke, such as:  Electrocardiogram (ECG).  Continuous heart monitoring.  Echocardiogram.  Carotid ultrasound.  A scan of the brain circulation.  Blood tests.  Sleep study to check for sleep apnea.  How is this treated? Treatment for this condition will depend on the duration, severity, and cause of your symptoms and on the area of the brain affected. It is very important to get treatment at the first sign of stroke symptoms. Some treatments work better if they are done within 3-6 hours of the onset of  stroke symptoms. These initial treatments may include:  Aspirin.  Medicines to control blood pressure.  Medicine given by injection to dissolve the blood clot (thrombolytic).  Treatments given directly to the affected artery to remove or dissolve the blood clot.  Other treatment options may include:  Oxygen.  IV fluids.  Medicines to thin the blood (anticoagulants or antiplatelets).  Procedures to increase blood flow.  Medicines and changes to your diet may be used to help treat and manage risk factors for stroke, such as diabetes, high cholesterol, and high blood pressure. After a stroke, you may work with physical, speech, mental health, or occupational therapists to help you recover. Follow these instructions at home: Medicines  Take over-the-counter and prescription medicines only as told by your health care provider.  If you were told to take a medicine to thin your blood, such as aspirin or an anticoagulant, take it exactly as told by your health care provider. ? Taking  too much blood-thinning medicine can cause bleeding. ? If you do not take enough blood-thinning medicine, you will not have the protection that you need against another stroke and other problems.  Understand the side effects of taking anticoagulant medicine. When taking this type of medicine, make sure you: ? Hold pressure over any cuts for longer than usual. ? Tell your dentist and other health care providers that you are taking anticoagulants before you have any procedures that may cause bleeding. ? Avoid activities that may cause trauma or injury. Eating and drinking  Follow instructions from your health care provider about diet.  Eat healthy foods.  If your ability to swallow was affected by the stroke, you may need to take steps to avoid choking, such as: ? Taking small bites when eating. ? Eating foods that are soft or pureed. Safety  Follow instructions from your health care team about  physical activity.  Use a walker or cane as told by your health care provider.  Take steps to create a safe home environment in order to reduce the risk of falls. This may include: ? Having your home looked at by specialists. ? Installing grab bars in the bedroom and bathroom. ? Using safety equipment, such as raised toilets and a seat in the shower. General instructions  Do not use any tobacco products, such as cigarettes, chewing tobacco, and e-cigarettes. If you need help quitting, ask your health care provider.  Limit alcohol intake to no more than 1 drink a day for nonpregnant women and 2 drinks a day for men. One drink equals 12 oz of beer, 5 oz of wine, or 1 oz of hard liquor.  If you need help to stop using drugs or alcohol, ask your health care provider about a referral to a program or specialist.  Maintain an active and healthy lifestyle. Get regular exercise as told by your health care provider.  Keep all follow-up visits as told by your health care provider, including visits with all specialists on your health care team. This is important. How is this prevented? Your risk of another stroke can be decreased by managing high blood pressure, high cholesterol, diabetes, heart disease, sleep apnea, and obesity. It can also be decreased by quitting smoking, limiting alcohol, and staying physically active. Your health care provider will continue to work with you on measures to prevent short-term and long-term complications of stroke. Get help right away if: You have:  Sudden weakness or numbness in your face, arm, or leg, especially on one side of your body.  Sudden confusion.  Sudden trouble speaking, understanding, or both (aphasia).  Sudden trouble seeing with one or both eyes.  Sudden trouble walking or difficulty moving your arms or legs.  Sudden dizziness.  Sudden loss of balance or coordination.  Sudden, severe headache with no known cause.  A partial or total  loss of consciousness.  A seizure. Any of these symptoms may represent a serious problem that is an emergency. Do not wait to see if the symptoms will go away. Get medical help right away. Call your local emergency services (911 in [place] Do not drive yourself to the hospital. This information is not intended to replace advice given to you by your health care provider. Make sure you discuss any questions you have with your health care provider. Document Released: 03/26/2005 Document Revised: 09/06/2015 Document Reviewed: 06/22/2015 Elsevier Interactive Patient Education  2017 [employer].

## 2017-01-25 NOTE — Progress Notes (Addendum)
Patient ID: Darren Carlson, male  DOB: [birth year], 41 y.o.   MRN: [id number] Patient Care Team    Relationship Specialty Notifications Start End  Ma Hillock, DO PCP - General Family Medicine  01/23/17     Chief Complaint  Patient presents with  . Hypertension    follow up    Subjective:  Darren Carlson is a 41 y.o.  male present for new patient establishment 2 days ago with HTN urgency, fluid overload and DOE, refusing ED transfer. He did not go to the ED. He did start the lasix and amlodipine. He did have the CXR completed (the next day) which showed evidence of cardiomegaly. He has noticed an improvement in his edema and 4 lbs of fluid loss. He denies reaction to either medication.  He had the echo scheduled by our office, however it was schedule for this week. Wanted sooner, but he reports he is going to [place] this week.   Prior note 01/23/2017: Patient presents today with new patient establishment with reports of dyspnea on exertion that has increased since summer. He and his family have moved into a new home in the area, and they'll started to become ill. They had to have their duct work cleaned, carpets removed and house inspected. After these were completed he felt that he was improving, but still with symptoms. He was also treated for severe bronchitis at that time. No chest x-ray obtained. He has never had blood pressure issues in the past. He states it has been borderline at times but not consistently high. He has been exercising pretty routinely without chest pain, dizziness, syncope or headache. He admits to lower extremity edema and dyspnea on exertion. He contributed to his allergies and asthma. He has been seeing an asthma specialist and started on Qvar 80 1 puff twice a day. He was also started on Prilosec, which he states he is not taking. He was investigated chest x-ray, but admits he has not done so yet. He is using dymista and Flonase, he is not taking a oral allergy  regimen. He is aware he is extremely overweight, and he has been working out attempting to lose weight. He reports he has steadily lost inches over the last few months. He brings no records with him today, and his had no recent work to report.   Depression screen PHQ 2/9 01/23/2017  Decreased Interest 0  Down, Depressed, Hopeless 0  PHQ - 2 Score 0   No flowsheet data found.        No flowsheet data found.    There is no immunization history on file for this patient.  No exam data present  Past Medical History:  Diagnosis Date  . Asthma   . Environmental allergies   . GERD (gastroesophageal reflux disease)   . Obesity    Allergies  Allergen Reactions  . Amoxicillin Rash  . Penicillins Rash   Past Surgical History:  Procedure Laterality Date  . WISDOM TOOTH EXTRACTION     Family History  Problem Relation Age of Onset  . Colon cancer Maternal Grandmother 62       early death  . Diabetes Paternal Grandmother   . Kidney disease Paternal Grandmother   . Diabetes Paternal Grandfather   . Kidney disease Paternal Grandfather   . Colon cancer Maternal Aunt 6   Social History   Social History  . Marital status: Married    Spouse name: [name]  . Number of children: 2  .  Years of education: 61   Occupational History  . restaurant owner    Social History Main Topics  . Smoking status: Never Smoker  . Smokeless tobacco: Never Used  . Alcohol use Yes     Comment: occasional  . Drug use: No  . Sexual activity: Yes    Partners: Female    Birth control/ protection: None   Other Topics Concern  . Not on file   Social History Narrative   Married to Buras, 2 children.    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 Drinks caffeine.    Smoke alarm in the home. Wears seatbelt.    Feels safe in his relationships.       Allergies as of 01/25/2017      Reactions   Amoxicillin Rash   Penicillins Rash      Medication List         Accurate as of 01/25/17 12:47 PM. Always use your most recent med list.          albuterol 108 (90 Base) MCG/ACT inhaler Commonly known as:  PROVENTIL HFA;VENTOLIN HFA Inhale 2 puffs into the lungs every 6 (six) hours as needed for wheezing or shortness of breath.   amLODipine 10 MG tablet Commonly known as:  NORVASC Take 1 tablet (10 mg total) by mouth daily.   azithromycin 250 MG tablet Commonly known as:  ZITHROMAX 500 mg , 250 mg QD   DYMISTA 137-50 MCG/ACT Susp Generic drug:  Azelastine-Fluticasone USE 1 SPRAY INTO EACH NOSTRIL TWICE A DAY   furosemide 20 MG tablet Commonly known as:  LASIX Take 1 tablet (20 mg total) by mouth daily.   lisinopril 20 MG tablet Commonly known as:  PRINIVIL,ZESTRIL Take 1 tablet (20 mg total) by mouth daily.   metFORMIN 500 MG tablet Commonly known as:  GLUCOPHAGE Take 1 tablet (500 mg total) by mouth 2 (two) times daily with a meal.   omeprazole 20 MG capsule Commonly known as:  PRILOSEC TAKE 1 CAPSULE ONCE A DAY 1/2 HOUR PRIOR TO BREAKFAST ORALLY 30 DAY(S)   Potassium Chloride ER 20 MEQ Tbcr 2 tabs daily for 4 days, then one tab daily.   QVAR REDIHALER 80 MCG/ACT inhaler Generic drug:  beclomethasone TAKE 1 PUFF BY MOUTH TWICE A DAY       All past medical history, surgical history, allergies, family history, immunizations andmedications were updated in the EMR today and reviewed under the history and medication portions of their EMR.    Recent Results (from the past 2160 hour(s))  CBC w/Diff     Status: Abnormal   Collection Time: 01/23/17  4:28 PM  Result Value Ref Range   WBC 11.3 (H) 3.8 - 10.8 Thousand/uL   RBC 4.62 4.20 - 5.80 Million/uL   Hemoglobin 12.4 (L) 13.2 - 17.1 g/dL   HCT 37.0 (L) 38.5 - 50.0 %   MCV 80.1 80.0 - 100.0 fL   MCH 26.8 (L) 27.0 - 33.0 pg   MCHC 33.5 32.0 - 36.0 g/dL   RDW 14.9 11.0 - 15.0 %   Platelets 199 140 - 400 Thousand/uL   MPV 12.2 7.5 - 12.5 fL   Neutro Abs 9,085 (H) 1,500 -  7,800 cells/uL   Lymphs Abs 1,458 850 - 3,900 cells/uL   WBC mixed population 565 200 - 950 cells/uL   Eosinophils Absolute 147 15 - 500 cells/uL   Basophils Absolute 45 0 - 200 cells/uL   Neutrophils Relative %  80.4 %   Total Lymphocyte 12.9 %   Monocytes Relative 5.0 %   Eosinophils Relative 1.3 %   Basophils Relative 0.4 %  HgB A1c     Status: Abnormal   Collection Time: 01/23/17  4:28 PM  Result Value Ref Range   Hgb A1c MFr Bld 6.4 (H) <5.7 % of total Hgb    Comment: For someone without known diabetes, a hemoglobin  A1c value between 5.7% and 6.4% is consistent with prediabetes and should be confirmed with a  follow-up test. . For someone with known diabetes, a value <7% indicates that their diabetes is well controlled. A1c targets should be individualized based on duration of diabetes, age, comorbid conditions, and other considerations. . This assay result is consistent with an increased risk of diabetes. . Currently, no consensus exists regarding use of hemoglobin A1c for diagnosis of diabetes for children. .    Mean Plasma Glucose 137 (calc)   eAG (mmol/L) 7.6 (calc)  Comp Met (CMET)     Status: Abnormal   Collection Time: 01/23/17  4:28 PM  Result Value Ref Range   Glucose, Bld 127 (H) 65 - 99 mg/dL    Comment: .            Fasting reference interval . For someone without known diabetes, a glucose value >125 mg/dL indicates that they may have diabetes and this should be confirmed with a follow-up test. .    BUN 19 7 - 25 mg/dL   Creat 1.34 0.60 - 1.35 mg/dL   BUN/Creatinine Ratio NOT APPLICABLE 6 - 22 (calc)   Sodium 139 135 - 146 mmol/L   Potassium 3.3 (L) 3.5 - 5.3 mmol/L   Chloride 99 98 - 110 mmol/L   CO2 32 20 - 32 mmol/L   Calcium 9.6 8.6 - 10.3 mg/dL   Total Protein 7.0 6.1 - 8.1 g/dL   Albumin 4.4 3.6 - 5.1 g/dL   Globulin 2.6 1.9 - 3.7 g/dL (calc)   AG Ratio 1.7 1.0 - 2.5 (calc)   Total Bilirubin 1.3 (H) 0.2 - 1.2 mg/dL   Alkaline  phosphatase (APISO) 45 40 - 115 U/L   AST 18 10 - 40 U/L   ALT 20 9 - 46 U/L  TSH     Status: None   Collection Time: 01/23/17  4:28 PM  Result Value Ref Range   TSH 2.13 0.40 - 4.50 mIU/L  Direct LDL     Status: Abnormal   Collection Time: 01/23/17  4:28 PM  Result Value Ref Range   Direct LDL 157 (H) <100 mg/dL    Comment: Greatly elevated Triglycerides values (>1200 mg/dL) interfere with the dLDL assay. As no Triglycerides  testing was ordered, interpret results with caution. . Desirable range <100 mg/dL for primary prevention;   <70 mg/dL for patients with CHD or diabetic patients  with > or = 2 CHD risk factors. .     Patient was never admitted.   ROS: 14 pt review of systems performed and negative (unless mentioned in an HPI)  Objective: BP (!) 209/122   Pulse 93   Temp 98.3 F (36.8 C)   Resp 20   Ht '5\' 10"'  (1.778 m)   Wt (!) 392 lb (177.8 kg)   SpO2 94%   BMI 56.25 kg/m   Gen: Afebrile. No acute distress. Nontoxic in appearance, morbidly obese. pleasant caucasian male.  HENT: AT. [place].  MMM.  Eyes:Pupils Equal Round Reactive to light, Extraocular movements intact,  Conjunctiva without redness, discharge or icterus. CV: RRR no murmur, +1 edema, Chest: CTAB, no wheeze or crackles Skin: hyperpigmentation bilateral LE, no ulcerations. Neuro: Normal gait. PERLA. EOMi. Alert. Oriented x3  Psych: Normal affect, dress and demeanor. Normal speech. Normal thought content and judgment. EKG 01/23/2017: SR.  Heart rate 100. PR 154, QT 386. Nonspecific T-wave out normality. No ST elevations. Atrial enlargement. Right-sided conduction defect, right axis, possible right ventricular hypertrophy or posterior fascicular block  Assessment/plan: Dheeraj Hail is a 41 y.o. male present for establishment visit with  Dyspnea on exertion/Hypertensive urgency/Abnormal EKG/Morbid obesity with BMI of 50.0-59.9, adult (HCC)/cardiomegaly/hypokalemia/prediabetes Patient presented yesterday  for establishment with complaints of DOE last 3-4 months, in hypertensive urgency, abnl EKG and fluid overloaded -->refusing to go to ED.  He has never been prescribed medications for BP in the past.  Patient was educated about blood pressure needs to be slowly decreased and this is difficult to do in outpatient setting, with someone who is never been on blood pressure medications.educated on morbidity/mortality with potential complications including but limited to MI, Stroke or death. He still declined to go to ED. AVS has been provided on both for continued education.  He was started on Amlodipine and lasix. Today on follow up no improvement with BP, 4 lbs weight/fluid loss and edema has improved some. Pt was again very strongly encouraged to go to ED and declined. He states he has a trip to [place] planned this week. He was strongly urged not to go on that trip. Again discussed MI and stroke risk.  - all labs and xray results discussed in detail with pt today.  - Xray with evidence of significant cardiomegaly.  - Echocardiogram has been ordered and scheduled.  - Discussed the need for urgent cardiology referral, and he does agree to this today. Referral placed.  - Will eventually need sleep study after blood pressure and fluid overload better controlled. - CBC w/Diff--> mild anemic--> iron panel collected today. Mild WBC increase--> Z- pack (mild wheezing/bronchitis/asthma symptoms) - HgB A1c--> 6.4--> started metformin BID today - Comp Met (CMET)--> WNL with the exception of mild hypokalemia --> KDUR 20 BID for 4 days, then QD with lasix use. --> rpt BMP 4 days.  - TSH--> WNL - DG Chest 2 View; Future--> cardiomegaly--> echo and cardio referral - Direct LDL--> elevated 157--> fasting  Labs ordered 4 days.  - EKG 12-Lead--> abnormal. Echo ordered. Referral urgently to cardio for full cardiac evaluation  - iron panel, BNP and microalbumin collected today.  - Increase Amlodipine 10 mg QD, start  lisinopril 20 mg QD, increase lasix 20 BID for 3 days then QD. Start Kdur 40 daily for 4 days then QD. Continue ASA 81 mg QD.  - f/u 4 days  Moderate asthma without complication, unspecified whether persistent - Continue Flonase - Continue Qvar 1 puff twice a day, may need to increase to 2 puffs twice a day. Patient's blood pressure and fluid status needs to be corrected, then can revisit his response to Qvar. - continue Claritin (plain) - May need to consider Xyzal and Singulair addition. - Albuterol inhaler prescribed yesterday patient is to use before exercise and when necessary for wheezing or shortness of breath.    Return in about 4 days (around 01/29/2017) for HTN'.  Note is dictated utilizing voice recognition software. Although note has been proof read prior to signing, occasional typographical errors still can be missed. If any questions arise, please do not hesitate to call for  verification.  Electronically signed by: Howard Pouch, DO [place]

## 2017-01-25 NOTE — Discharge Instructions (Signed)
Please continue used to take your blood pressure medications as prescribed. Follow-up with the cardiologist in your primary care doctor on Tuesday as scheduled. Return without fail for worsening symptoms, including confusion, intractable vomiting, severe chest pain or difficulty breathing, or any other symptoms concerning to you.

## 2017-01-26 LAB — IRON,TIBC AND FERRITIN PANEL
%SAT: 17 % (calc) (ref 15–60)
FERRITIN: 125 ng/mL (ref 20–380)
IRON: 65 ug/dL (ref 50–180)
TIBC: 390 ug/dL (ref 250–425)

## 2017-01-29 ENCOUNTER — Other Ambulatory Visit (HOSPITAL_COMMUNITY): Payer: Self-pay

## 2017-01-29 ENCOUNTER — Ambulatory Visit (INDEPENDENT_AMBULATORY_CARE_PROVIDER_SITE_OTHER): Payer: BLUE CROSS/BLUE SHIELD | Admitting: Family Medicine

## 2017-01-29 ENCOUNTER — Ambulatory Visit: Payer: BLUE CROSS/BLUE SHIELD | Admitting: Family Medicine

## 2017-01-29 ENCOUNTER — Ambulatory Visit (HOSPITAL_COMMUNITY)
Admission: RE | Admit: 2017-01-29 | Discharge: 2017-01-29 | Disposition: A | Discharge status: Home / Self Care | Payer: BLUE CROSS/BLUE SHIELD | Source: Ambulatory Visit | Attending: Family Medicine | Admitting: Family Medicine

## 2017-01-29 ENCOUNTER — Encounter: Payer: Self-pay | Admitting: Family Medicine

## 2017-01-29 VITALS — BP 156/97 | HR 86 | Resp 16 | Wt 382.0 lb

## 2017-01-29 DIAGNOSIS — R0600 Dyspnea, unspecified: Secondary | ICD-10-CM

## 2017-01-29 DIAGNOSIS — I160 Hypertensive urgency: Secondary | ICD-10-CM

## 2017-01-29 DIAGNOSIS — I5041 Acute combined systolic (congestive) and diastolic (congestive) heart failure: Secondary | ICD-10-CM | POA: Diagnosis not present

## 2017-01-29 DIAGNOSIS — I517 Cardiomegaly: Secondary | ICD-10-CM | POA: Diagnosis not present

## 2017-01-29 DIAGNOSIS — I420 Dilated cardiomyopathy: Secondary | ICD-10-CM | POA: Diagnosis not present

## 2017-01-29 DIAGNOSIS — R0609 Other forms of dyspnea: Secondary | ICD-10-CM | POA: Diagnosis not present

## 2017-01-29 DIAGNOSIS — E876 Hypokalemia: Secondary | ICD-10-CM | POA: Diagnosis not present

## 2017-01-29 DIAGNOSIS — R600 Localized edema: Secondary | ICD-10-CM

## 2017-01-29 DIAGNOSIS — Z6841 Body Mass Index (BMI) 40.0 and over, adult: Secondary | ICD-10-CM | POA: Diagnosis not present

## 2017-01-29 DIAGNOSIS — I10 Essential (primary) hypertension: Secondary | ICD-10-CM

## 2017-01-29 DIAGNOSIS — I5030 Unspecified diastolic (congestive) heart failure: Secondary | ICD-10-CM | POA: Insufficient documentation

## 2017-01-29 DIAGNOSIS — I5032 Chronic diastolic (congestive) heart failure: Secondary | ICD-10-CM

## 2017-01-29 HISTORY — DX: Chronic diastolic (congestive) heart failure: I50.32

## 2017-01-29 LAB — CBC WITH DIFFERENTIAL/PLATELET
BASOS PCT: 0.6 % (ref 0.0–3.0)
Basophils Absolute: 0.1 10*3/uL (ref 0.0–0.1)
EOS ABS: 0.1 10*3/uL (ref 0.0–0.7)
Eosinophils Relative: 0.8 % (ref 0.0–5.0)
HEMATOCRIT: 43.2 % (ref 39.0–52.0)
Hemoglobin: 14.1 g/dL (ref 13.0–17.0)
LYMPHS ABS: 0.9 10*3/uL (ref 0.7–4.0)
Lymphocytes Relative: 9.3 % — ABNORMAL LOW (ref 12.0–46.0)
MCHC: 32.6 g/dL (ref 30.0–36.0)
MCV: 83.3 fl (ref 78.0–100.0)
Monocytes Absolute: 0.7 10*3/uL (ref 0.1–1.0)
Monocytes Relative: 7.8 % (ref 3.0–12.0)
NEUTROS ABS: 7.7 10*3/uL (ref 1.4–7.7)
NEUTROS PCT: 81.5 % — AB (ref 43.0–77.0)
PLATELETS: 245 10*3/uL (ref 150.0–400.0)
RBC: 5.19 Mil/uL (ref 4.22–5.81)
RDW: 16.5 % — AB (ref 11.5–15.5)
WBC: 9.5 10*3/uL (ref 4.0–10.5)

## 2017-01-29 LAB — COMPREHENSIVE METABOLIC PANEL
ALT: 29 U/L (ref 0–53)
AST: 25 U/L (ref 0–37)
Albumin: 4.3 g/dL (ref 3.5–5.2)
Alkaline Phosphatase: 36 U/L — ABNORMAL LOW (ref 39–117)
BILIRUBIN TOTAL: 1 mg/dL (ref 0.2–1.2)
BUN: 18 mg/dL (ref 6–23)
CALCIUM: 9.5 mg/dL (ref 8.4–10.5)
CHLORIDE: 102 meq/L (ref 96–112)
CO2: 29 meq/L (ref 19–32)
CREATININE: 1.17 mg/dL (ref 0.40–1.50)
GFR: 73.32 mL/min (ref >60.00)
GLUCOSE: 148 mg/dL — AB (ref 70–99)
Potassium: 4.1 mEq/L (ref 3.5–5.1)
SODIUM: 138 meq/L (ref 135–145)
Total Protein: 6.8 g/dL (ref 6.0–8.3)

## 2017-01-29 MED ORDER — PERFLUTREN LIPID MICROSPHERE
1.0000 mL | INTRAVENOUS | Status: AC | PRN
  Administered 2017-01-29: 2 mL via INTRAVENOUS
  Filled 2017-01-29: qty 10

## 2017-01-29 MED ORDER — LISINOPRIL 40 MG PO TABS: 40.0000 mg | ORAL_TABLET | Freq: Every day | ORAL | 0 refills | Status: DC

## 2017-01-29 NOTE — Patient Instructions (Signed)
Lasix decrease to 20 mg QD starting tomorrow.  Continue potassium supplement daily.  Start lisinopril 40 mg today (take total of 2- 20 mg tab until run out-new script will ne 40 mg tab--> then take 1) Continue amlodipine 10 mg a day.  Follow up here in 2 weeks.   Make appt for fasting labs this week (just labs- no provider appt)  Check your cuff size, you should have an XL cuff. Continue to monitor BP and record.   Low sodium diet. Heart healthy diet.   Follow with cardiology tomorrow at 8:40, make sure to be at least 15 minutes early.      Heart Failure Heart failure means your heart has trouble pumping blood. This makes it hard for your body to work well. Heart failure is usually a long-term (chronic) condition. You must take good care of yourself and follow your doctor's treatment plan. Follow these instructions at home:  Take your heart medicine as told by your doctor. ? Do not stop taking medicine unless your doctor tells you to. ? Do not skip any dose of medicine. ? Refill your medicines before they run out. ? Take other medicines only as told by your doctor or pharmacist.  Stay active if told by your doctor. The elderly and people with severe heart failure should talk with a doctor about physical activity.  Eat heart-healthy foods. Choose foods that are without trans fat and are low in saturated fat, cholesterol, and salt (sodium). This includes fresh or frozen fruits and vegetables, fish, lean meats, fat-free or low-fat dairy foods, whole grains, and high-fiber foods. Lentils and dried peas and beans (legumes) are also good choices.  Limit salt if told by your doctor.  Cook in a healthy way. Roast, grill, broil, bake, poach, steam, or stir-fry foods.  Limit fluids as told by your doctor.  Weigh yourself every morning. Do this after you pee (urinate) and before you eat breakfast. Write down your weight to give to your doctor.  Take your blood pressure and write it down  if your doctor tells you to.  Ask your doctor how to check your pulse. Check your pulse as told.  Lose weight if told by your doctor.  Stop smoking or chewing tobacco. Do not use gum or patches that help you quit without your doctor's approval.  Schedule and go to doctor visits as told.  Nonpregnant women should have no more than 1 drink a day. Men should have no more than 2 drinks a day. Talk to your doctor about drinking alcohol.  Stop illegal drug use.  Stay current with shots (immunizations).  Manage your health conditions as told by your doctor.  Learn to manage your stress.  Rest when you are tired.  If it is really hot outside: ? Avoid intense activities. ? Use air conditioning or fans, or get in a cooler place. ? Avoid caffeine and alcohol. ? Wear loose-fitting, lightweight, and light-colored clothing.  If it is really cold outside: ? Avoid intense activities. ? Layer your clothing. ? Wear mittens or gloves, a hat, and a scarf when going outside. ? Avoid alcohol.  Learn about heart failure and get support as needed.  Get help to maintain or improve your quality of life and your ability to care for yourself as needed. Contact a doctor if:  You gain weight quickly.  You are more short of breath than usual.  You cannot do your normal activities.  You tire easily.  You cough more  than normal, especially with activity.  You have any or more puffiness (swelling) in areas such as your hands, feet, ankles, or belly (abdomen).  You cannot sleep because it is hard to breathe.  You feel like your heart is beating fast (palpitations).  You get dizzy or light-headed when you stand up. Get help right away if:  You have trouble breathing.  There is a change in mental status, such as becoming less alert or not being able to focus.  You have chest pain or discomfort.  You faint. This information is not intended to replace advice given to you by your health care  provider. Make sure you discuss any questions you have with your health care provider. Document Released: 01/03/2008 Document Revised: 09/01/2015 Document Reviewed: 05/12/2012 Elsevier Interactive Patient Education  2017 [employer]  DASH Eating Plan DASH stands for "Dietary Approaches to Stop Hypertension." The DASH eating plan is a healthy eating plan that has been shown to reduce high blood pressure (hypertension). It may also reduce your risk for type 2 diabetes, heart disease, and stroke. The DASH eating plan may also help with weight loss. What are tips for following this plan? General guidelines  Avoid eating more than 2,300 mg (milligrams) of salt (sodium) a day. If you have hypertension, you may need to reduce your sodium intake to 1,500 mg a day.  Limit alcohol intake to no more than 1 drink a day for nonpregnant women and 2 drinks a day for men. One drink equals 12 oz of beer, 5 oz of wine, or 1 oz of hard liquor.  Work with your health care provider to maintain a healthy body weight or to lose weight. Ask what an ideal weight is for you.  Get at least 30 minutes of exercise that causes your heart to beat faster (aerobic exercise) most days of the week. Activities may include walking, swimming, or biking.  Work with your health care provider or diet and nutrition specialist (dietitian) to adjust your eating plan to your individual calorie needs. Reading food labels  Check food labels for the amount of sodium per serving. Choose foods with less than 5 percent of the Daily Value of sodium. Generally, foods with less than 300 mg of sodium per serving fit into this eating plan.  To find whole grains, look for the word "whole" as the first word in the ingredient list. Shopping  Buy products labeled as "low-sodium" or "no salt added."  Buy fresh foods. Avoid canned foods and premade or frozen meals. Cooking  Avoid adding salt when cooking. Use salt-free seasonings or herbs  instead of table salt or sea salt. Check with your health care provider or pharmacist before using salt substitutes.  Do not fry foods. Cook foods using healthy methods such as baking, boiling, grilling, and broiling instead.  Cook with heart-healthy oils, such as olive, canola, soybean, or sunflower oil. Meal planning   Eat a balanced diet that includes: ? 5 or more servings of fruits and vegetables each day. At each meal, try to fill half of your plate with fruits and vegetables. ? Up to 6-8 servings of whole grains each day. ? Less than 6 oz of lean meat, poultry, or fish each day. A 3-oz serving of meat is about the same size as a deck of cards. One egg equals 1 oz. ? 2 servings of low-fat dairy each day. ? A serving of nuts, seeds, or beans 5 times each week. ? Heart-healthy fats. Healthy fats  called Omega-3 fatty acids are found in foods such as flaxseeds and coldwater fish, like sardines, salmon, and mackerel.  Limit how much you eat of the following: ? Canned or prepackaged foods. ? Food that is high in trans fat, such as fried foods. ? Food that is high in saturated fat, such as fatty meat. ? Sweets, desserts, sugary drinks, and other foods with added sugar. ? Full-fat dairy products.  Do not salt foods before eating.  Try to eat at least 2 vegetarian meals each week.  Eat more home-cooked food and less restaurant, buffet, and fast food.  When eating at a restaurant, ask that your food be prepared with less salt or no salt, if possible. What foods are recommended? The items listed may not be a complete list. Talk with your dietitian about what dietary choices are best for you. Grains Whole-grain or whole-wheat bread. Whole-grain or whole-wheat pasta. Brown rice. Orpah Cobb. Bulgur. Whole-grain and low-sodium cereals. Pita bread. Low-fat, low-sodium crackers. Whole-wheat flour tortillas. Vegetables Fresh or frozen vegetables (raw, steamed, roasted, or grilled).  Low-sodium or reduced-sodium tomato and vegetable juice. Low-sodium or reduced-sodium tomato sauce and tomato paste. Low-sodium or reduced-sodium canned vegetables. Fruits All fresh, dried, or frozen fruit. Canned fruit in natural juice (without added sugar). Meat and other protein foods Skinless chicken or [place]. Ground chicken or [place]. Pork with fat trimmed off. Fish and seafood. Egg whites. Dried beans, peas, or lentils. Unsalted nuts, nut butters, and seeds. Unsalted canned beans. Lean cuts of beef with fat trimmed off. Low-sodium, lean deli meat. Dairy Low-fat (1%) or fat-free (skim) milk. Fat-free, low-fat, or reduced-fat cheeses. Nonfat, low-sodium ricotta or cottage cheese. Low-fat or nonfat yogurt. Low-fat, low-sodium cheese. Fats and oils Soft margarine without trans fats. Vegetable oil. Low-fat, reduced-fat, or light mayonnaise and salad dressings (reduced-sodium). Canola, safflower, olive, soybean, and sunflower oils. Avocado. Seasoning and other foods Herbs. Spices. Seasoning mixes without salt. Unsalted popcorn and pretzels. Fat-free sweets. What foods are not recommended? The items listed may not be a complete list. Talk with your dietitian about what dietary choices are best for you. Grains Baked goods made with fat, such as croissants, muffins, or some breads. Dry pasta or rice meal packs. Vegetables Creamed or fried vegetables. Vegetables in a cheese sauce. Regular canned vegetables (not low-sodium or reduced-sodium). Regular canned tomato sauce and paste (not low-sodium or reduced-sodium). Regular tomato and vegetable juice (not low-sodium or reduced-sodium). Rosita Fire. Olives. Fruits Canned fruit in a light or heavy syrup. Fried fruit. Fruit in cream or butter sauce. Meat and other protein foods Fatty cuts of meat. Ribs. Fried meat. Tomasa Blase. Sausage. Bologna and other processed lunch meats. Salami. Fatback. Hotdogs. Bratwurst. Salted nuts and seeds. Canned beans with added  salt. Canned or smoked fish. Whole eggs or egg yolks. Chicken or [place] with skin. Dairy Whole or 2% milk, cream, and half-and-half. Whole or full-fat cream cheese. Whole-fat or sweetened yogurt. Full-fat cheese. Nondairy creamers. Whipped toppings. Processed cheese and cheese spreads. Fats and oils Butter. Stick margarine. Lard. Shortening. Ghee. Bacon fat. Tropical oils, such as coconut, palm kernel, or palm oil. Seasoning and other foods Salted popcorn and pretzels. Onion salt, garlic salt, seasoned salt, table salt, and sea salt. Worcestershire sauce. Tartar sauce. Barbecue sauce. Teriyaki sauce. Soy sauce, including reduced-sodium. Steak sauce. Canned and packaged gravies. Fish sauce. Oyster sauce. Cocktail sauce. Horseradish that you find on the shelf. Ketchup. Mustard. Meat flavorings and tenderizers. Bouillon cubes. Hot sauce and Tabasco sauce. Premade or packaged marinades.  Premade or packaged taco seasonings. Relishes. Regular salad dressings. Where to find more information:  National Heart, Lung, and Blood Institute: PopSteam.is  American Heart Association: www.heart.org Summary  The DASH eating plan is a healthy eating plan that has been shown to reduce high blood pressure (hypertension). It may also reduce your risk for type 2 diabetes, heart disease, and stroke.  With the DASH eating plan, you should limit salt (sodium) intake to 2,300 mg a day. If you have hypertension, you may need to reduce your sodium intake to 1,500 mg a day.  When on the DASH eating plan, aim to eat more fresh fruits and vegetables, whole grains, lean proteins, low-fat dairy, and heart-healthy fats.  Work with your health care provider or diet and nutrition specialist (dietitian) to adjust your eating plan to your individual calorie needs. This information is not intended to replace advice given to you by your health care provider. Make sure you discuss any questions you have with your health care  provider. Document Released: 03/15/2011 Document Revised: 03/19/2016 Document Reviewed: 03/19/2016 Elsevier Interactive Patient Education  2017 [employer].

## 2017-01-29 NOTE — Progress Notes (Signed)
Patient ID: Darren Carlson, male  DOB: [birth year], 41 y.o.   MRN: [id number] Patient Care Team    Relationship Specialty Notifications Start End  Ma Hillock, DO PCP - General Family Medicine  01/23/17     Chief Complaint  Patient presents with  . Hypertension    follow up    Subjective:  Darren Carlson is a 41 y.o.  male present for follow-up on hypertension and results of echocardiogram. Patient did go to the emergency room after our last visit and received clonidine, which brought down his blood pressure. He then followed instructions to increase his blood pressure regimen improving then blood pressure readings today he's collected at home. Average home blood pressure readings 160s/90s. He is tolerating the amlodipine 10 mg daily, lisinopril 20 mg daily. He is finishing the twice a day dosing of Lasix, and we'll start with Lasix 20 mg tomorrow.Patient has his appointment with cardiology tomorrow morning. His echocardiogram resulted with significant mild LVH pattern,  Moderately decreased systolic  Function 75-83 percent ejection fracture. Grade 2 diastolic dysfunction.Left atrium moderately to severely  Dilated.  Labs collected last visit BNP 302, this was after 2 doses of Lasix. elevated microalbumin 43.3. He has lost 14 pounds in 5 days. He endorses improvement in his breathing. He denies chest pain, dizziness or syncope. He reports the edema in his legs has greatly improved. He and his family have all started eating a low sodium healthier  diet.  Prior note: new patient establishment 2 days ago with HTN urgency, fluid overload and DOE, refusing ED transfer. He did not go to the ED. He did start the lasix and amlodipine. He did have the CXR completed (the next day) which showed evidence of cardiomegaly. He has noticed an improvement in his edema and 4 lbs of fluid loss. He denies reaction to either medication.  He had the echo scheduled by our office, however it was schedule for this week.  Wanted sooner, but he reports he is going to [place] this week.   Prior note 01/23/2017: Patient presents today with new patient establishment with reports of dyspnea on exertion that has increased since summer. He and his family have moved into a new home in the area, and they'll started to become ill. They had to have their duct work cleaned, carpets removed and house inspected. After these were completed he felt that he was improving, but still with symptoms. He was also treated for severe bronchitis at that time. No chest x-ray obtained. He has never had blood pressure issues in the past. He states it has been borderline at times but not consistently high. He has been exercising pretty routinely without chest pain, dizziness, syncope or headache. He admits to lower extremity edema and dyspnea on exertion. He contributed to his allergies and asthma. He has been seeing an asthma specialist and started on Qvar 80 1 puff twice a day. He was also started on Prilosec, which he states he is not taking. He was investigated chest x-ray, but admits he has not done so yet. He is using dymista and Flonase, he is not taking a oral allergy regimen. He is aware he is extremely overweight, and he has been working out attempting to lose weight. He reports he has steadily lost inches over the last few months. He brings no records with him today, and his had no recent work to report.  Echocardiogram 01/29/2017: cc:  ------------------------------------------------------------------- LV EF: 35% -   40%  -------------------------------------------------------------------  Indications:      Dyspnea 786.09.  ------------------------------------------------------------------- History:   Risk factors:  Abnormal EKG Prediabetes Cardiomegaly Hypertension.  ------------------------------------------------------------------- Study Conclusions  - Left ventricle: The cavity size was normal. Wall thickness was    increased in a pattern of mild LVH. There was mild concentric   hypertrophy. Systolic function was moderately reduced. The   estimated ejection fraction was in the range of 35% to 40%.   Diffuse hypokinesis. Features are consistent with a pseudonormal   left ventricular filling pattern, with concomitant abnormal   relaxation and increased filling pressure (grade 2 diastolic   dysfunction). Doppler parameters are consistent with high   ventricular filling pressure. - Left atrium: The atrium was moderately to severely dilated. - Cardiac MRI is recommended.  ------------------------------------------------------------------- Study data:  No prior study was available for comparison.  Study status:  Routine.  Procedure:  The patient reported no pain pre or post test. Transthoracic echocardiography. Image quality was suboptimal. The study was technically difficult, as a result of poor acoustic windows and body habitus. Intravenous contrast (Definity) was administered.  Study completion:  There were no complications.          Transthoracic echocardiography.  M-mode, complete 2D, spectral Doppler, and color Doppler.  Birthdate: Patient birthdate: 19-Jun-1975.  Age:  Patient is 41 yr old.  Sex: Gender: male.    BMI: 56.2 kg/m^2.  Blood pressure:     155/87 Patient status:  Outpatient.  Study date:  Study date: 01/29/2017. Study time: 08:10 AM.  Location:  Echo laboratory.  -------------------------------------------------------------------  ------------------------------------------------------------------- Left ventricle:  The cavity size was normal. Wall thickness was increased in a pattern of mild LVH. There was mild concentric hypertrophy. Systolic function was moderately reduced. The estimated ejection fraction was in the range of 35% to 40%. Diffuse hypokinesis. Features are consistent with a pseudonormal left ventricular filling pattern, with concomitant abnormal relaxation and  increased filling pressure (grade 2 diastolic dysfunction). Doppler parameters are consistent with high ventricular filling pressure.  ------------------------------------------------------------------- Aortic valve:   Structurally normal valve. Trileaflet; normal thickness leaflets. Cusp separation was normal. Mobility was not restricted.  Doppler:  Transvalvular velocity was within the normal range. There was no stenosis. There was no regurgitation.  ------------------------------------------------------------------- Aorta:  The aorta was normal, not dilated, and non-diseased. Aortic root: The aortic root was normal in size.  ------------------------------------------------------------------- Mitral valve:   Structurally normal valve.   Leaflet separation was normal. Mobility was not restricted.  Doppler:  Transvalvular velocity was within the normal range. There was no evidence for stenosis. There was no regurgitation.    Peak gradient (D): 6 mm Hg.  ------------------------------------------------------------------- Left atrium:  The atrium was moderately to severely dilated.  ------------------------------------------------------------------- Atrial septum:  The septum was normal.  ------------------------------------------------------------------- Pulmonary veins: Common pulmonary vein:  The Doppler velocity and flow profile were normal.  ------------------------------------------------------------------- Right ventricle:  The cavity size was normal. Wall thickness was normal. Systolic function was normal.  ------------------------------------------------------------------- Pulmonic valve:    Structurally normal valve.   Cusp separation was normal.  Doppler:  Transvalvular velocity was within the normal range. There was no evidence for stenosis. There was no regurgitation.  ------------------------------------------------------------------- Tricuspid valve:    Structurally normal valve.   Leaflet separation was normal.  Doppler:  Transvalvular velocity was within the normal range. There was no regurgitation.  ------------------------------------------------------------------- Pulmonary artery:   Poorly visualized. The main pulmonary artery was normal-sized. Systolic pressure was within the normal range.   ------------------------------------------------------------------- Right atrium:  The  atrium was normal in size.  ------------------------------------------------------------------- Pericardium:  The pericardium was normal in appearance. There was no pericardial effusion.  ------------------------------------------------------------------- Systemic veins: Inferior vena cava: The vessel was normal in size. The respirophasic diameter changes were in the normal range (= 50%), consistent with normal central venous pressure.  ------------------------------------------------------------------- Post procedure conclusions Ascending Aorta:  - The aorta was normal, not dilated, and non-diseased.  ------------------------------------------------------------------- Measurements   Left ventricle                         Value        Reference  LV ID, ED, PLAX chordal                47.4  mm     43 - 52  LV ID, ES, PLAX chordal                34.4  mm     23 - 38  LV fx shortening, PLAX chordal (L)     27    %      >=29  LV PW thickness, ED                    12.9  mm     ----------  IVS/LV PW ratio, ED                    1.06         <=1.3  Stroke volume, 2D                      95    ml     ----------  Stroke volume/bsa, 2D                  31    ml/m^2 ----------  LV e&', medial                          9.14  cm/s   ----------  LV E/e&', medial                        13.13        ----------    Ventricular septum                     Value        Reference  IVS thickness, ED                      13.7  mm     ----------    LVOT                                    Value        Reference  LVOT ID, S                             23    mm     ----------  LVOT area                              4.15  cm^2   ----------  LVOT peak velocity, S  132   cm/s   ----------  LVOT mean velocity, S                  95    cm/s   ----------  LVOT VTI, S                            22.9  cm     ----------  LVOT peak gradient, S                  7     mm Hg  ----------    Aorta                                  Value        Reference  Aortic root ID, ED                     35    mm     ----------    Left atrium                            Value        Reference  LA ID, A-P, ES                         54    mm     ----------  LA ID/bsa, A-P                         1.76  cm/m^2 <=2.2  LA volume, S                           129   ml     ----------  LA volume/bsa, S                       42    ml/m^2 ----------  LA volume, ES, 1-p A4C                 110   ml     ----------  LA volume/bsa, ES, 1-p A4C             35.9  ml/m^2 ----------  LA volume, ES, 1-p A2C                 138   ml     ----------  LA volume/bsa, ES, 1-p A2C             45    ml/m^2 ----------    Mitral valve                           Value        Reference  Mitral E-wave peak velocity            120   cm/s   ----------  Mitral A-wave peak velocity            68.7  cm/s   ----------  Mitral deceleration time               164   ms     150 - 230  Mitral peak gradient, D  6     mm Hg  ----------  Mitral E/A ratio, peak                 1.7          ----------    Right atrium                           Value        Reference  RA ID, S-I, ES, A4C            (H)     68.4  mm     34 - 49  RA area, ES, A4C               (H)     29.8  cm^2   8.3 - 19.5  RA volume, ES, A/L                     106   ml     ----------  RA volume/bsa, ES, A/L                 34.5  ml/m^2 ----------    Systemic veins                         Value        Reference  Estimated  CVP                          3     mm Hg  ----------    Right ventricle                        Value        Reference  TAPSE                                  18.9  mm     ----------  Legend: (L)  and  (H)  mark values outside specified reference range.  ------------------------------------------------------------------- Prepared and Electronically Authenticated by  Shirlee More, MD 2018-10-23T10:31:15  Depression screen [facility] 2/9 01/23/2017  Decreased Interest 0  Down, Depressed, Hopeless 0  PHQ - 2 Score 0    Past Medical History:  Diagnosis Date  . Asthma   . Environmental allergies   . GERD (gastroesophageal reflux disease)   . Obesity    Allergies  Allergen Reactions  . Amoxicillin Rash  . Penicillins Rash   Past Surgical History:  Procedure Laterality Date  . WISDOM TOOTH EXTRACTION     Family History  Problem Relation Age of Onset  . Colon cancer Maternal Grandmother 37       early death  . Diabetes Paternal Grandmother   . Kidney disease Paternal Grandmother   . Diabetes Paternal Grandfather   . Kidney disease Paternal Grandfather   . Colon cancer Maternal Aunt 71   Social History   Social History  . Marital status: Married    Spouse name: [name]  . Number of children: 2  . Years of education: 16   Occupational History  . restaurant owner    Social History Main Topics  . Smoking status: Never Smoker  . Smokeless tobacco: Never Used  . Alcohol use Yes     Comment: occasional  . Drug use: No  .  Sexual activity: Yes    Partners: Female    Birth control/ protection: None   Other Topics Concern  . Not on file   Social History Narrative   Married to [place], 2 children.    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 Drinks caffeine.    Smoke alarm in the home. Wears seatbelt.    Feels safe in his relationships.       Allergies as of 01/29/2017      Reactions   Amoxicillin Rash   Penicillins Rash        Medication List       Accurate as of 01/29/17 11:59 PM. Always use your most recent med list.          albuterol 108 (90 Base) MCG/ACT inhaler Commonly known as:  PROVENTIL HFA;VENTOLIN HFA Inhale 2 puffs into the lungs every 6 (six) hours as needed for wheezing or shortness of breath.   amLODipine 10 MG tablet Commonly known as:  NORVASC Take 1 tablet (10 mg total) by mouth daily.   aspirin EC 81 MG tablet Take 81 mg by mouth daily.   azithromycin 250 MG tablet Commonly known as:  ZITHROMAX 500 mg , 250 mg QD   DYMISTA 137-50 MCG/ACT Susp Generic drug:  Azelastine-Fluticasone USE 1 SPRAY INTO EACH NOSTRIL TWICE A DAY   furosemide 20 MG tablet Commonly known as:  LASIX Take 1 tablet (20 mg total) by mouth daily.   lisinopril 40 MG tablet Commonly known as:  PRINIVIL,ZESTRIL Take 1 tablet (40 mg total) by mouth daily.   metFORMIN 500 MG tablet Commonly known as:  GLUCOPHAGE Take 1 tablet (500 mg total) by mouth 2 (two) times daily with a meal.   omeprazole 20 MG capsule Commonly known as:  PRILOSEC TAKE 1 CAPSULE ONCE A DAY 1/2 HOUR PRIOR TO BREAKFAST ORALLY 30 DAY(S)   Potassium Chloride ER 20 MEQ Tbcr 2 tabs daily for 4 days, then one tab daily.   QVAR REDIHALER 80 MCG/ACT inhaler Generic drug:  beclomethasone TAKE 1 PUFF BY MOUTH TWICE A DAY       All past medical history, surgical history, allergies, family history, immunizations andmedications were updated in the EMR today and reviewed under the history and medication portions of their EMR.    Recent Results (from the past 2160 hour(s))  CBC w/Diff     Status: Abnormal   Collection Time: 01/23/17  4:28 PM  Result Value Ref Range   WBC 11.3 (H) 3.8 - 10.8 Thousand/uL   RBC 4.62 4.20 - 5.80 Million/uL   Hemoglobin 12.4 (L) 13.2 - 17.1 g/dL   HCT 37.0 (L) 38.5 - 50.0 %   MCV 80.1 80.0 - 100.0 fL   MCH 26.8 (L) 27.0 - 33.0 pg   MCHC 33.5 32.0 - 36.0 g/dL   RDW 14.9 11.0 - 15.0 %   Platelets 199 140  - 400 Thousand/uL   MPV 12.2 7.5 - 12.5 fL   Neutro Abs 9,085 (H) 1,500 - 7,800 cells/uL   Lymphs Abs 1,458 850 - 3,900 cells/uL   WBC mixed population 565 200 - 950 cells/uL   Eosinophils Absolute 147 15 - 500 cells/uL   Basophils Absolute 45 0 - 200 cells/uL   Neutrophils Relative % 80.4 %   Total Lymphocyte 12.9 %   Monocytes Relative 5.0 %   Eosinophils Relative 1.3 %   Basophils Relative 0.4 %  HgB A1c     Status: Abnormal  Collection Time: 01/23/17  4:28 PM  Result Value Ref Range   Hgb A1c MFr Bld 6.4 (H) <5.7 % of total Hgb    Comment: For someone without known diabetes, a hemoglobin  A1c value between 5.7% and 6.4% is consistent with prediabetes and should be confirmed with a  follow-up test. . For someone with known diabetes, a value <7% indicates that their diabetes is well controlled. A1c targets should be individualized based on duration of diabetes, age, comorbid conditions, and other considerations. . This assay result is consistent with an increased risk of diabetes. . Currently, no consensus exists regarding use of hemoglobin A1c for diagnosis of diabetes for children. .    Mean Plasma Glucose 137 (calc)   eAG (mmol/L) 7.6 (calc)  Comp Met (CMET)     Status: Abnormal   Collection Time: 01/23/17  4:28 PM  Result Value Ref Range   Glucose, Bld 127 (H) 65 - 99 mg/dL    Comment: .            Fasting reference interval . For someone without known diabetes, a glucose value >125 mg/dL indicates that they may have diabetes and this should be confirmed with a follow-up test. .    BUN 19 7 - 25 mg/dL   Creat 1.34 0.60 - 1.35 mg/dL   BUN/Creatinine Ratio NOT APPLICABLE 6 - 22 (calc)   Sodium 139 135 - 146 mmol/L   Potassium 3.3 (L) 3.5 - 5.3 mmol/L   Chloride 99 98 - 110 mmol/L   CO2 32 20 - 32 mmol/L   Calcium 9.6 8.6 - 10.3 mg/dL   Total Protein 7.0 6.1 - 8.1 g/dL   Albumin 4.4 3.6 - 5.1 g/dL   Globulin 2.6 1.9 - 3.7 g/dL (calc)   AG Ratio 1.7 1.0  - 2.5 (calc)   Total Bilirubin 1.3 (H) 0.2 - 1.2 mg/dL   Alkaline phosphatase (APISO) 45 40 - 115 U/L   AST 18 10 - 40 U/L   ALT 20 9 - 46 U/L  TSH     Status: None   Collection Time: 01/23/17  4:28 PM  Result Value Ref Range   TSH 2.13 0.40 - 4.50 mIU/L  Direct LDL     Status: Abnormal   Collection Time: 01/23/17  4:28 PM  Result Value Ref Range   Direct LDL 157 (H) <100 mg/dL    Comment: Greatly elevated Triglycerides values (>1200 mg/dL) interfere with the dLDL assay. As no Triglycerides  testing was ordered, interpret results with caution. . Desirable range <100 mg/dL for primary prevention;   <70 mg/dL for patients with CHD or diabetic patients  with > or = 2 CHD risk factors. .   Iron, TIBC and Ferritin Panel     Status: None   Collection Time: 01/25/17 12:10 PM  Result Value Ref Range   Iron 65 50 - 180 mcg/dL   TIBC 390 250 - 425 mcg/dL (calc)   %SAT 17 15 - 60 % (calc)   Ferritin 125 20 - 380 ng/mL  B Nat Peptide     Status: Abnormal   Collection Time: 01/25/17 12:10 PM  Result Value Ref Range   Pro B Natriuretic peptide (BNP) 302.0 (H) 0.0 - 100.0 pg/mL  Urine Microalbumin w/creat. ratio     Status: Abnormal   Collection Time: 01/25/17 12:10 PM  Result Value Ref Range   Microalb, Ur 106.9 (H) 0.0 - 1.9 mg/dL   Creatinine,U 247.1 mg/dL   Microalb Creat Ratio  43.3 (H) 0.0 - 30.0 mg/g  Comp Met (CMET)     Status: Abnormal   Collection Time: 01/29/17  1:36 PM  Result Value Ref Range   Sodium 138 135 - 145 mEq/L   Potassium 4.1 3.5 - 5.1 mEq/L   Chloride 102 96 - 112 mEq/L   CO2 29 19 - 32 mEq/L   Glucose, Bld 148 (H) 70 - 99 mg/dL   BUN 18 6 - 23 mg/dL   Creatinine, Ser 1.17 0.40 - 1.50 mg/dL   Total Bilirubin 1.0 0.2 - 1.2 mg/dL   Alkaline Phosphatase 36 (L) 39 - 117 U/L   AST 25 0 - 37 U/L   ALT 29 0 - 53 U/L   Total Protein 6.8 6.0 - 8.3 g/dL   Albumin 4.3 3.5 - 5.2 g/dL   Calcium 9.5 8.4 - 10.5 mg/dL   GFR 73.32 >60.00 mL/min  CBC w/Diff     Status:  Abnormal   Collection Time: 01/29/17  1:36 PM  Result Value Ref Range   WBC 9.5 4.0 - 10.5 K/uL   RBC 5.19 4.22 - 5.81 Mil/uL   Hemoglobin 14.1 13.0 - 17.0 g/dL   HCT 43.2 39.0 - 52.0 %   MCV 83.3 78.0 - 100.0 fl   MCHC 32.6 30.0 - 36.0 g/dL   RDW 16.5 (H) 11.5 - 15.5 %   Platelets 245.0 150.0 - 400.0 K/uL   Neutrophils Relative % 81.5 (H) 43.0 - 77.0 %   Lymphocytes Relative 9.3 (L) 12.0 - 46.0 %   Monocytes Relative 7.8 3.0 - 12.0 %   Eosinophils Relative 0.8 0.0 - 5.0 %   Basophils Relative 0.6 0.0 - 3.0 %   Neutro Abs 7.7 1.4 - 7.7 K/uL   Lymphs Abs 0.9 0.7 - 4.0 K/uL   Monocytes Absolute 0.7 0.1 - 1.0 K/uL   Eosinophils Absolute 0.1 0.0 - 0.7 K/uL   Basophils Absolute 0.1 0.0 - 0.1 K/uL    Patient was never admitted.   ROS: 14 pt review of systems performed and negative (unless mentioned in an HPI)  Objective: BP (!) 156/97 (BP Location: Right Arm, Patient Position: Sitting, Cuff Size: Large)   Pulse 86   Resp 16   Wt (!) 382 lb (173.3 kg)   SpO2 99%   BMI 54.81 kg/m   Gen: Afebrile. No acute distress. Nontoxic in appearance, well developed, pleasant morbidly obese, Caucasian male HENT: AT. [place]. MMM.  Eyes:Pupils Equal Round Reactive to light, Extraocular movements intact,  Conjunctiva without redness, discharge or icterus. CV: RRR no murmur, noedema, +2/4 P posterior tibialis pulses Chest: CTAB, no wheeze or crackles Skin: no rashes, purpura or petechiae. Hyperpigmentation bilateral lower extremities Skin intact. Neuro:  Normal gait. PERLA. EOMi. Alert. Oriented.   EKG 01/23/2017: SR.  Heart rate 100. PR 154, QT 386. Nonspecific T-wave out normality. No ST elevations. Atrial enlargement. Right-sided conduction defect, right axis, possible right ventricular hypertrophy or posterior fascicular block  Assessment/plan: Darren Carlson is a 41 y.o. male present for establishment visit with  Essential hypertension/cardiomegaly/dyspnea on exertion/morbid obesity Acute  combined systolic and diastolic heart failure ([place]) - reviewed all the labs and echocardiogram with patient today. Blood pressure is improving, but not at goal. Goal blood pressure below 130/80. - fasting labs to be collected this week by lab appointment only to check cholesterol. Most likely would benefit from a statin. However attempting not to overwhelm patient with the amount of medications and changes that have been necessary for him  this past week. - Low-sodium diet. Exercise greater than 150 minutes a week. - continue amlodipine 10 mg daily. Increase lisinopril to 40 mg daily. Continue Lasix 20 mg daily. Continue to monitor blood pressures once daily, and record. - Continue baby aspirin daily. - eventually would like to refer to pulmonology for sleep study. - follow with cardiology tomorrow. Cardiology appointment discussed with patient today. Discussed the likelihood of starting a beta blocker, which is another medication given the results of his echocardiogram and likely they will schedule a stress test.  - follow-up here in 2 weeks.  Hypokalemia - continue potassium supplement 20 mEq daily WITH Lasix use. - Comp Met (CMET)  Follow-up 2 weeks  Note is dictated utilizing voice recognition software. Although note has been proof read prior to signing, occasional typographical errors still can be missed. If any questions arise, please do not hesitate to call for verification.  Electronically signed by: Howard Pouch, DO [place]

## 2017-01-29 NOTE — Progress Notes (Signed)
  Echocardiogram 2D Echocardiogram with definity has been performed.   Darren Carlson L Androw 01/29/2017, 9:58 AM

## 2017-01-29 NOTE — Progress Notes (Signed)
Called to echo room for IV start for definity echo.  Patient very clammy, pale, diaphoretic after 2 unsuccessful IV attempted by echo dept. Patient VSS, given crackers and gingerale on ice. After 20 minute rest, patient ok to resume iv start. PIV 24 g x 1 attempt inserted to patient's RPH. Patient presentation much better, not diaphoretic, color WNL, and appreciative of time. Echo tech to remove PIV after definity echo completed.  Ave FilterBradley, Megan Genevea, RN

## 2017-01-29 NOTE — Addendum Note (Signed)
Addended by: Chyrl CivatteBRADLEY, Delcie Ruppert G on: 01/29/2017 09:45 AM   Modules accepted: Orders

## 2017-01-30 ENCOUNTER — Encounter: Payer: Self-pay | Admitting: Cardiology

## 2017-01-30 ENCOUNTER — Ambulatory Visit (INDEPENDENT_AMBULATORY_CARE_PROVIDER_SITE_OTHER): Payer: BLUE CROSS/BLUE SHIELD | Admitting: Cardiology

## 2017-01-30 VITALS — BP 144/76 | HR 76 | Resp 16 | Ht 70.0 in | Wt 382.0 lb

## 2017-01-30 DIAGNOSIS — R7303 Prediabetes: Secondary | ICD-10-CM

## 2017-01-30 DIAGNOSIS — I420 Dilated cardiomyopathy: Secondary | ICD-10-CM | POA: Diagnosis not present

## 2017-01-30 DIAGNOSIS — R0600 Dyspnea, unspecified: Secondary | ICD-10-CM

## 2017-01-30 DIAGNOSIS — R0609 Other forms of dyspnea: Secondary | ICD-10-CM | POA: Diagnosis not present

## 2017-01-30 DIAGNOSIS — I429 Cardiomyopathy, unspecified: Secondary | ICD-10-CM | POA: Insufficient documentation

## 2017-01-30 DIAGNOSIS — I5041 Acute combined systolic (congestive) and diastolic (congestive) heart failure: Secondary | ICD-10-CM | POA: Diagnosis not present

## 2017-01-30 DIAGNOSIS — I10 Essential (primary) hypertension: Secondary | ICD-10-CM | POA: Diagnosis not present

## 2017-01-30 HISTORY — DX: Cardiomyopathy, unspecified: I42.9

## 2017-01-30 MED ORDER — CARVEDILOL 3.125 MG PO TABS: 3.1250 mg | ORAL_TABLET | Freq: Two times a day (BID) | ORAL | 6 refills | Status: DC

## 2017-01-30 NOTE — Progress Notes (Signed)
Lab order canceled as requested.

## 2017-01-30 NOTE — Progress Notes (Signed)
Cardiology Consultation:    Date:  01/30/2017   ID:  Darren PandaSean Thivierge, DOB 1976-02-19, MRN [id number]  PCP:  Natalia LeatherwoodKuneff, Renee A, DO  Cardiologist:  Gypsy Balsamobert Cowen Pesqueira, MD   Referring MD: Natalia LeatherwoodKuneff, Renee A, DO   Chief Complaint  Patient presents with  . Hypertension  . Shortness of Breath  . Edema  And having shortness of breath  History of Present Illness:    Darren Carlson is a 41 y.o. male who is being seen today for the evaluation of shortness of breath and cardiomyopathy at the request of Kuneff, Renee A, DO. He's been experiencing shortness of breath swelling of lower extremities as well as paroxysmal nocturnal dyspnea for the last few weeks. Initial thinking was that it's related to asthma however eventually chest x-ray was done which showed cardiomegaly and appropriate therapy was initiated. He is already on ACE inhibitor as well as diuretic. Improved significantly. He said swelling is improved shortness of breath improved to normal, he does not have any proximal nocturnal dyspnea. He described to have some wheezes but this was happening only at night as well as during the day when she tried to exert himself. Denies having chest pain tightness squeezing pressure burning chest. Recently he was diagnosed with borderline diabetes. He does have morbid obesity with history of multiple trials to weight loss. He snores a lot during the night and I suspect he does have sleep apnea  Past Medical History:  Diagnosis Date  . Asthma   . Environmental allergies   . GERD (gastroesophageal reflux disease)   . Obesity     Past Surgical History:  Procedure Laterality Date  . WISDOM TOOTH EXTRACTION      Current Medications: Current Meds  Medication Sig  . albuterol (PROVENTIL HFA;VENTOLIN HFA) 108 (90 Base) MCG/ACT inhaler Inhale 2 puffs into the lungs every 6 (six) hours as needed for wheezing or shortness of breath.  Marland Kitchen. amLODipine (NORVASC) 10 MG tablet Take 1 tablet (10 mg total) by mouth daily.    Marland Kitchen. aspirin EC 81 MG tablet Take 81 mg by mouth daily.  Marland Kitchen. azithromycin (ZITHROMAX) 250 MG tablet 500 mg , 250 mg QD  . DYMISTA 137-50 MCG/ACT SUSP USE 1 SPRAY INTO EACH NOSTRIL TWICE A DAY  . furosemide (LASIX) 20 MG tablet Take 1 tablet (20 mg total) by mouth daily.  Marland Kitchen. lisinopril (PRINIVIL,ZESTRIL) 40 MG tablet Take 1 tablet (40 mg total) by mouth daily.  . potassium chloride 20 MEQ TBCR 2 tabs daily for 4 days, then one tab daily.  Marland Kitchen. QVAR REDIHALER 80 MCG/ACT inhaler TAKE 1 PUFF BY MOUTH TWICE A DAY     Allergies:   Amoxicillin and Penicillins   Social History   Social History  . Marital status: Married    Spouse name: [name]  . Number of children: 2  . Years of education: 16   Occupational History  . restaurant owner    Social History Main Topics  . Smoking status: Never Smoker  . Smokeless tobacco: Never Used  . Alcohol use Yes     Comment: occasional  . Drug use: No  . Sexual activity: Yes    Partners: Female    Birth control/ protection: None   Other Topics Concern  . None   Social History Narrative   Married to [place], 2 children.    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 Drinks caffeine.    Smoke alarm in the home. Wears seatbelt.  Feels safe in his relationships.         Family History: The patient's family history includes Colon cancer (age of onset: 55) in his maternal aunt; Colon cancer (age of onset: 22) in his maternal grandmother; Diabetes in his paternal grandfather and paternal grandmother; Kidney disease in his paternal grandfather and paternal grandmother. ROS:   Please see the history of present illness.    All 14 point review of systems negative except as described per history of present illness.  EKGs/Labs/Other Studies Reviewed:    The following studies were reviewed today: Echocardiogram showing diminished ejection fraction 35%   Recent Labs: 01/23/2017: TSH 2.13 01/25/2017: Pro B Natriuretic  peptide (BNP) 302.0 01/29/2017: ALT 29; BUN 18; Creatinine, Ser 1.17; Hemoglobin 14.1; Platelets 245.0; Potassium 4.1; Sodium 138  Recent Lipid Panel    Component Value Date/Time   LDLDIRECT 157 (H) 01/23/2017 1628    Physical Exam:    VS:  BP (!) 144/76   Pulse 76   Resp 16   Ht 5\' 10"  (1.778 m)   Wt (!) 382 lb (173.3 kg)   BMI 54.81 kg/m     Wt Readings from Last 3 Encounters:  01/30/17 (!) 382 lb (173.3 kg)  01/29/17 (!) 382 lb (173.3 kg)  01/25/17 (!) 392 lb (177.8 kg)     GEN:  Well nourished, well developed in no acute distress HEENT: Normal NECK: No JVD; No carotid bruits LYMPHATICS: No lymphadenopathy CARDIAC: RRR, no murmurs, no rubs, no gallops RESPIRATORY:  Clear to auscultation without rales, wheezing or rhonchi  ABDOMEN: Soft, non-tender, non-distended MUSCULOSKELETAL:  1+ swelling; No deformity  SKIN: Warm and dry NEUROLOGIC:  Alert and oriented x 3 PSYCHIATRIC:  Normal affect   ASSESSMENT:    1. Acute combined systolic and diastolic heart failure (HCC)   2. Essential hypertension   3. Dyspnea on exertion   4. Prediabetes   5. Dilated cardiomyopathy (HCC)    PLAN:    In order of problems listed above:  1. Cardiomyopathy: The etiology of this phenomenon is unclear at the moment. He is overweight and very good dose of ACE inhibitor, also diuretic and potassium which I will continue for now I will check his Chem-7 as well as proBNP today. I will initiate therapy with beta blocker with carvedilol 3.125 mg twice daily. I told him if his wheezes will get worse he needs to stop that medication. I suspect however that significant portion of his wheezes were related to decompensated CHF. The future will add Aldactone. Will also consider workup for potential etiology of his cardio myopathy which will include ischemia workup. 2. Essential hypertension: His blood pressure is better but not perfectly controlled yet hopefully additional beta blocker will help with  that. 3. Dyspnea on exertion: Most likely related to his cardiomyopathy and congestive heart therapy. 4. Prediabetes: He is on metformin and. I will check his Chem-7 today. 5. Potentially sleep apnea: Will do a sleep study in the future. 6. Morbid obesity: Obviously a problem he understand he started to work on losing weight. I told him with weight loss his diabetes will improve, he sleep apnea if he truly have 1 with improved as well   Medication Adjustments/Labs and Tests Ordered: Current medicines are reviewed at length with the patient today.  Concerns regarding medicines are outlined above.  No orders of the defined types were placed in this encounter.  No orders of the defined types were placed in this encounter.   Signed, Marveen Reeks.  Bing Matter, MD, [facility]. 01/30/2017 9:28 AM    [facility]

## 2017-01-30 NOTE — Patient Instructions (Signed)
Medication Instructions:  Your physician has recommended you make the following change in your medication:  1.) START Carvedilol 3.152 mg twice daily.   1. Avoid all over-the-counter antihistamines except Claritin/Loratadine and Zyrtec/Cetrizine. 2. Avoid all combination including cold sinus allergies flu decongestant and sleep medications 3. You can use Robitussin DM Mucinex and Mucinex DM for cough. 4. can use Tylenol aspirin ibuprofen and naproxen but no combinations such as sleep or sinus.  Labwork: Your physician recommends that you have lab work today to check your fluid congestion level and Kidney function as well as sodium level and potassium.   Testing/Procedures: None   Follow-Up: Your physician recommends that you schedule a follow-up appointment in: 1 week   Any Other Special Instructions Will Be Listed Below (If Applicable).  Please note that any paperwork needing to be filled out by the provider will need to be addressed at the front desk prior to seeing the provider. Please note that any paperwork FMLA, Disability or other documents regarding health condition is subject to a $25.00 charge that must be received prior to completion of paperwork in the form of a money order or check.     If you need a refill on your cardiac medications before your next appointment, please call your pharmacy.

## 2017-01-31 ENCOUNTER — Other Ambulatory Visit (INDEPENDENT_AMBULATORY_CARE_PROVIDER_SITE_OTHER): Payer: BLUE CROSS/BLUE SHIELD

## 2017-01-31 DIAGNOSIS — E876 Hypokalemia: Secondary | ICD-10-CM | POA: Diagnosis not present

## 2017-01-31 DIAGNOSIS — I160 Hypertensive urgency: Secondary | ICD-10-CM

## 2017-01-31 DIAGNOSIS — E7800 Pure hypercholesterolemia, unspecified: Secondary | ICD-10-CM | POA: Diagnosis not present

## 2017-01-31 LAB — LIPID PANEL
CHOL/HDL RATIO: 5
Cholesterol: 194 mg/dL (ref 0–200)
HDL: 37.8 mg/dL — AB (ref >39.00)
LDL Cholesterol: 140 mg/dL — ABNORMAL HIGH (ref 0–99)
NonHDL: 156.12
Triglycerides: 80 mg/dL (ref 0.0–149.0)
VLDL: 16 mg/dL (ref 0.0–40.0)

## 2017-01-31 LAB — BASIC METABOLIC PANEL
BUN / CREAT RATIO: 14 (ref 9–20)
BUN: 15 mg/dL (ref 6–24)
CALCIUM: 9.1 mg/dL (ref 8.7–10.2)
CHLORIDE: 99 mmol/L (ref 96–106)
CO2: 27 mmol/L (ref 20–29)
Creatinine, Ser: 1.07 mg/dL (ref 0.76–1.27)
GFR, EST AFRICAN AMERICAN: 100 mL/min/{1.73_m2} (ref >59)
GFR, EST NON AFRICAN AMERICAN: 86 mL/min/{1.73_m2} (ref >59)
Glucose: 170 mg/dL — ABNORMAL HIGH (ref 65–99)
POTASSIUM: 4 mmol/L (ref 3.5–5.2)
SODIUM: 141 mmol/L (ref 134–144)

## 2017-01-31 LAB — PRO B NATRIURETIC PEPTIDE: NT-PRO BNP: 258 pg/mL — AB (ref 0–86)

## 2017-02-01 ENCOUNTER — Other Ambulatory Visit (HOSPITAL_COMMUNITY): Payer: BLUE CROSS/BLUE SHIELD

## 2017-02-07 ENCOUNTER — Ambulatory Visit (INDEPENDENT_AMBULATORY_CARE_PROVIDER_SITE_OTHER): Payer: BLUE CROSS/BLUE SHIELD | Admitting: Cardiology

## 2017-02-07 ENCOUNTER — Encounter: Payer: Self-pay | Admitting: Cardiology

## 2017-02-07 VITALS — BP 160/102 | HR 78 | Resp 19 | Ht 70.0 in | Wt 383.0 lb

## 2017-02-07 DIAGNOSIS — I420 Dilated cardiomyopathy: Secondary | ICD-10-CM | POA: Diagnosis not present

## 2017-02-07 DIAGNOSIS — Z6841 Body Mass Index (BMI) 40.0 and over, adult: Secondary | ICD-10-CM | POA: Diagnosis not present

## 2017-02-07 DIAGNOSIS — I5041 Acute combined systolic (congestive) and diastolic (congestive) heart failure: Secondary | ICD-10-CM | POA: Diagnosis not present

## 2017-02-07 DIAGNOSIS — R7303 Prediabetes: Secondary | ICD-10-CM | POA: Diagnosis not present

## 2017-02-07 MED ORDER — CARVEDILOL 6.25 MG PO TABS: 6.2500 mg | ORAL_TABLET | Freq: Two times a day (BID) | ORAL | 11 refills | Status: DC

## 2017-02-07 NOTE — Progress Notes (Signed)
Cardiology Office Note:    Date:  02/07/2017   ID:  Darren Carlson, DOB 1975/11/23, MRN [id number]  PCP:  Natalia Leatherwood, DO  Cardiologist:  Gypsy Balsam, MD    Referring MD: Natalia Leatherwood, DO   Chief Complaint  Patient presents with  . Follow-up    1 week follow up; no concerns   Doing better but disappointed about weight not decreasing  History of Present Illness:    Darren Carlson is a 41 y.o. male with cardiomyopathy of unclear origin.  We gradually slowly putting on the right medications.  He is feeling great he said he got more energy he can do more he goes to gym and try to exercise.  He disappointed because his weight actually went up by 1 pound.  Have any chest pain tightness squeezing pressure burning chest no palpitations no dizziness no passing out.  Swelling of lower extremities improved dramatically.  Past Medical History:  Diagnosis Date  . Asthma   . Environmental allergies   . GERD (gastroesophageal reflux disease)   . Obesity     Past Surgical History:  Procedure Laterality Date  . WISDOM TOOTH EXTRACTION      Current Medications: Current Meds  Medication Sig  . albuterol (PROVENTIL HFA;VENTOLIN HFA) 108 (90 Base) MCG/ACT inhaler Inhale 2 puffs into the lungs every 6 (six) hours as needed for wheezing or shortness of breath.  Marland Kitchen amLODipine (NORVASC) 10 MG tablet Take 1 tablet (10 mg total) by mouth daily.  Marland Kitchen aspirin EC 81 MG tablet Take 81 mg by mouth daily.  . carvedilol (COREG) 3.125 MG tablet Take 1 tablet (3.125 mg total) by mouth 2 (two) times daily with a meal.  . furosemide (LASIX) 20 MG tablet Take 1 tablet (20 mg total) by mouth daily.  Marland Kitchen lisinopril (PRINIVIL,ZESTRIL) 40 MG tablet Take 1 tablet (40 mg total) by mouth daily.  . potassium chloride 20 MEQ TBCR 2 tabs daily for 4 days, then one tab daily.  Marland Kitchen QVAR REDIHALER 80 MCG/ACT inhaler TAKE 1 PUFF BY MOUTH TWICE A DAY     Allergies:   Amoxicillin and Penicillins   Social History   Social  History  . Marital status: Married    Spouse name: Darren Carlson  . Number of children: 2  . Years of education: 16   Occupational History  . restaurant owner    Social History Main Topics  . Smoking status: Never Smoker  . Smokeless tobacco: Never Used  . Alcohol use Yes     Comment: occasional  . Drug use: No  . Sexual activity: Yes    Partners: Female    Birth control/ protection: None   Other Topics Concern  . None   Social History Narrative   Married to [place], 2 children.    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 Drinks caffeine.    Smoke alarm in the home. Wears seatbelt.    Feels safe in his relationships.         Family History: The patient's family history includes Colon cancer (age of onset: 19) in his maternal aunt; Colon cancer (age of onset: 37) in his maternal grandmother; Diabetes in his paternal grandfather and paternal grandmother; Kidney disease in his paternal grandfather and paternal grandmother. ROS:   Please see the history of present illness.    All 14 point review of systems negative except as described per history of present illness  EKGs/Labs/Other Studies Reviewed:  Recent Labs: 01/23/2017: TSH 2.13 01/29/2017: ALT 29; Hemoglobin 14.1; Platelets 245.0 01/30/2017: BUN 15; Creatinine, Ser 1.07; NT-Pro BNP 258; Potassium 4.0; Sodium 141  Recent Lipid Panel    Component Value Date/Time   CHOL 194 01/31/2017 0851   TRIG 80.0 01/31/2017 0851   HDL 37.80 (L) 01/31/2017 0851   CHOLHDL 5 01/31/2017 0851   VLDL 16.0 01/31/2017 0851   LDLCALC 140 (H) 01/31/2017 0851   LDLDIRECT 157 (H) 01/23/2017 1628    Physical Exam:    VS:  BP (!) 162/102 (BP Location: Left Arm, Patient Position: Sitting, Cuff Size: Large)   Pulse 78   Resp 19   Ht 5\' 10"  (1.778 m)   Wt (!) 383 lb 0.6 oz (173.7 kg)   BMI 54.96 kg/m     Wt Readings from Last 3 Encounters:  02/07/17 (!) 383 lb 0.6 oz (173.7 kg)  01/30/17 (!) 382  lb (173.3 kg)  01/29/17 (!) 382 lb (173.3 kg)     GEN:  Well nourished, well developed in no acute distress HEENT: Normal NECK: No JVD; No carotid bruits LYMPHATICS: No lymphadenopathy CARDIAC: RRR, no murmurs, no rubs, no gallops RESPIRATORY:  Clear to auscultation without rales, wheezing or rhonchi  ABDOMEN: Soft, non-tender, non-distended MUSCULOSKELETAL:  No edema; No deformity  SKIN: Warm and dry LOWER EXTREMITIES: no swelling NEUROLOGIC:  Alert and oriented x 3 PSYCHIATRIC:  Normal affect   ASSESSMENT:    1. Acute combined systolic and diastolic heart failure (HCC)   2. Dilated cardiomyopathy (HCC)   3. Morbid obesity with BMI of 50.0-59.9, adult (HCC)   4. Prediabetes    PLAN:    In order of problems listed above:  1. Congestive heart failure: Improving.  Will do EKG today to see if he can increase dose of carvedilol.  He is already on very good dose of ACE inhibitor which I will continue.  I will check his Chem-7 today as well as proBNP he gained 1 pound on the physical exam I do not see any worsening of the retention of fluid but that something that can be checked more objectively.  In the near future will start to investigate what the reason for his cardiomyopathy is. 2. Prediabetes: Followed by internal medicine team. 3. Morbid obesity: He work harder on reducing his weight.  He goes to gym on a regular basis as well as watch what he eats.  Again he is very disappointed about his weight not going down we will check proBNP make sure that there is no subclinical worsening of CHF.  See him back in my office in about 3-4 weeks or sooner if he get a problem  Medication Adjustments/Labs and Tests Ordered: Current medicines are reviewed at length with the patient today.  Concerns regarding medicines are outlined above.  No orders of the defined types were placed in this encounter.  Medication changes: No orders of the defined types were placed in this  encounter.   Signed, Georgeanna Leaobert J. Lanier Felty, MD, [id number] 02/07/2017 9:37 AM    [facility]

## 2017-02-07 NOTE — Patient Instructions (Addendum)
Medication Instructions:  Your physician has recommended you make the following change in your medication:  1.) INCREASE Carvedilol to 6.25 mg twice daily.   Labwork: Your physician recommends that you have lab work today to check your fluid congestion level and Kidney function as well as sodium level and potassium.   Testing/Procedures: EKG today in office.   Follow-Up: Your physician recommends that you schedule a follow-up appointment in: 3-4 weeks   Any Other Special Instructions Will Be Listed Below (If Applicable).  Please note that any paperwork needing to be filled out by the provider will need to be addressed at the front desk prior to seeing the provider. Please note that any paperwork FMLA, Disability or other documents regarding health condition is subject to a $25.00 charge that must be received prior to completion of paperwork in the form of a money order or check.     If you need a refill on your cardiac medications before your next appointment, please call your pharmacy.

## 2017-02-08 LAB — BASIC METABOLIC PANEL
BUN/Creatinine Ratio: 12 (ref 9–20)
BUN: 13 mg/dL (ref 6–24)
CHLORIDE: 101 mmol/L (ref 96–106)
CO2: 25 mmol/L (ref 20–29)
Calcium: 9.2 mg/dL (ref 8.7–10.2)
Creatinine, Ser: 1.13 mg/dL (ref 0.76–1.27)
GFR, EST AFRICAN AMERICAN: 93 mL/min/{1.73_m2} (ref >59)
GFR, EST NON AFRICAN AMERICAN: 81 mL/min/{1.73_m2} (ref >59)
Glucose: 146 mg/dL — ABNORMAL HIGH (ref 65–99)
POTASSIUM: 4.2 mmol/L (ref 3.5–5.2)
SODIUM: 141 mmol/L (ref 134–144)

## 2017-02-08 LAB — PRO B NATRIURETIC PEPTIDE: NT-Pro BNP: 218 pg/mL — ABNORMAL HIGH (ref 0–86)

## 2017-02-13 ENCOUNTER — Encounter: Payer: Self-pay | Admitting: Family Medicine

## 2017-02-13 ENCOUNTER — Ambulatory Visit: Payer: BLUE CROSS/BLUE SHIELD | Admitting: Family Medicine

## 2017-02-13 VITALS — BP 158/98 | HR 78 | Temp 98.0°F | Resp 20 | Ht 70.0 in | Wt 384.0 lb

## 2017-02-13 DIAGNOSIS — I10 Essential (primary) hypertension: Secondary | ICD-10-CM

## 2017-02-13 DIAGNOSIS — I5041 Acute combined systolic (congestive) and diastolic (congestive) heart failure: Secondary | ICD-10-CM | POA: Diagnosis not present

## 2017-02-13 DIAGNOSIS — Z6841 Body Mass Index (BMI) 40.0 and over, adult: Secondary | ICD-10-CM

## 2017-02-13 DIAGNOSIS — I517 Cardiomegaly: Secondary | ICD-10-CM

## 2017-02-13 DIAGNOSIS — R7303 Prediabetes: Secondary | ICD-10-CM

## 2017-02-13 DIAGNOSIS — R600 Localized edema: Secondary | ICD-10-CM | POA: Diagnosis not present

## 2017-02-13 DIAGNOSIS — I420 Dilated cardiomyopathy: Secondary | ICD-10-CM

## 2017-02-13 MED ORDER — ATORVASTATIN CALCIUM 40 MG PO TABS: 40.0000 mg | ORAL_TABLET | Freq: Every day | ORAL | 3 refills | Status: DC

## 2017-02-13 MED ORDER — FUROSEMIDE 20 MG PO TABS: 40.0000 mg | ORAL_TABLET | Freq: Every day | ORAL | 1 refills | Status: DC

## 2017-02-13 NOTE — Progress Notes (Signed)
Patient ID: Darren Carlson, male  DOB: 1975/06/05, 41 y.o.   MRN: [id number] Patient Care Team    Relationship Specialty Notifications Start End  Ma Hillock, DO PCP - General Family Medicine  01/23/17     Chief Complaint  Patient presents with  . Follow-up    hypokalemia    Subjective:  Darren Carlson is a 41 y.o.  male present for follow-up on hypertension/hyperlipidemia/cardiomegaly/acute combined systolic and diastolic heart failure/cardiomyopathy. He has seen cardiology and started workup. He reports compliance with amlodipine 10 mg daily, lisinopril 40 mg daily, Lasix 40 mg daily, Coreg 6.25 mg twice a day. Along with daily baby aspirin. Most recent labs with elevated cholesterol findings. Patient reports his blood pressures at home her mildly elevated between 140-150/90-100. However cardiology has just recently increased his Lasix to 40 mg today. He denies current chest pain, shortness of breath, orthopnea or dizziness. He reports his extremities are much smaller, but still holding some fluid.  Prior note: new patient establishment 2 days ago with HTN urgency, fluid overload and DOE, refusing ED transfer. He did not go to the ED. He did start the lasix and amlodipine. He did have the CXR completed (the next day) which showed evidence of cardiomegaly. He has noticed an improvement in his edema and 4 lbs of fluid loss. He denies reaction to either medication.  He had the echo scheduled by our office, however it was schedule for this week. Wanted sooner, but he reports he is going to [place] this week.   Prior note 01/23/2017: Patient presents today with new patient establishment with reports of dyspnea on exertion that has increased since summer. He and his family have moved into a new home in the area, and they'll started to become ill. They had to have their duct work cleaned, carpets removed and house inspected. After these were completed he felt that he was improving, but still with  symptoms. He was also treated for severe bronchitis at that time. No chest x-ray obtained. He has never had blood pressure issues in the past. He states it has been borderline at times but not consistently high. He has been exercising pretty routinely without chest pain, dizziness, syncope or headache. He admits to lower extremity edema and dyspnea on exertion. He contributed to his allergies and asthma. He has been seeing an asthma specialist and started on Qvar 80 1 puff twice a day. He was also started on Prilosec, which he states he is not taking. He was investigated chest x-ray, but admits he has not done so yet. He is using dymista and Flonase, he is not taking a oral allergy regimen. He is aware he is extremely overweight, and he has been working out attempting to lose weight. He reports he has steadily lost inches over the last few months. He brings no records with him today, and his had no recent work to report.  Echocardiogram 01/29/2017: cc:  ------------------------------------------------------------------- LV EF: 35% -   40%  ------------------------------------------------------------------- Indications:      Dyspnea 786.09.  ------------------------------------------------------------------- History:   Risk factors:  Abnormal EKG Prediabetes Cardiomegaly Hypertension.  ------------------------------------------------------------------- Study Conclusions  - Left ventricle: The cavity size was normal. Wall thickness was   increased in a pattern of mild LVH. There was mild concentric   hypertrophy. Systolic function was moderately reduced. The   estimated ejection fraction was in the range of 35% to 40%.   Diffuse hypokinesis. Features are consistent with a pseudonormal  left ventricular filling pattern, with concomitant abnormal   relaxation and increased filling pressure (grade 2 diastolic   dysfunction). Doppler parameters are consistent with high   ventricular  filling pressure. - Left atrium: The atrium was moderately to severely dilated. - Cardiac MRI is recommended.  ------------------------------------------------------------------- Study data:  No prior study was available for comparison.  Study status:  Routine.  Procedure:  The patient reported no pain pre or post test. Transthoracic echocardiography. Image quality was suboptimal. The study was technically difficult, as a result of poor acoustic windows and body habitus. Intravenous contrast (Definity) was administered.  Study completion:  There were no complications.          Transthoracic echocardiography.  M-mode, complete 2D, spectral Doppler, and color Doppler.  Birthdate: Patient birthdate: Jun 15, 1975.  Age:  Patient is 41 yr old.  Sex: Gender: male.    BMI: 56.2 kg/m^2.  Blood pressure:     155/87 Patient status:  Outpatient.  Study date:  Study date: 01/29/2017. Study time: 08:10 AM.  Location:  Echo laboratory.  -------------------------------------------------------------------  ------------------------------------------------------------------- Left ventricle:  The cavity size was normal. Wall thickness was increased in a pattern of mild LVH. There was mild concentric hypertrophy. Systolic function was moderately reduced. The estimated ejection fraction was in the range of 35% to 40%. Diffuse hypokinesis. Features are consistent with a pseudonormal left ventricular filling pattern, with concomitant abnormal relaxation and increased filling pressure (grade 2 diastolic dysfunction). Doppler parameters are consistent with high ventricular filling pressure.  ------------------------------------------------------------------- Aortic valve:   Structurally normal valve. Trileaflet; normal thickness leaflets. Cusp separation was normal. Mobility was not restricted.  Doppler:  Transvalvular velocity was within the normal range. There was no stenosis. There was no  regurgitation.  ------------------------------------------------------------------- Aorta:  The aorta was normal, not dilated, and non-diseased. Aortic root: The aortic root was normal in size.  ------------------------------------------------------------------- Mitral valve:   Structurally normal valve.   Leaflet separation was normal. Mobility was not restricted.  Doppler:  Transvalvular velocity was within the normal range. There was no evidence for stenosis. There was no regurgitation.    Peak gradient (D): 6 mm Hg.  ------------------------------------------------------------------- Left atrium:  The atrium was moderately to severely dilated.  ------------------------------------------------------------------- Atrial septum:  The septum was normal.  ------------------------------------------------------------------- Pulmonary veins: Common pulmonary vein:  The Doppler velocity and flow profile were normal.  ------------------------------------------------------------------- Right ventricle:  The cavity size was normal. Wall thickness was normal. Systolic function was normal.  ------------------------------------------------------------------- Pulmonic valve:    Structurally normal valve.   Cusp separation was normal.  Doppler:  Transvalvular velocity was within the normal range. There was no evidence for stenosis. There was no regurgitation.  ------------------------------------------------------------------- Tricuspid valve:   Structurally normal valve.   Leaflet separation was normal.  Doppler:  Transvalvular velocity was within the normal range. There was no regurgitation.  ------------------------------------------------------------------- Pulmonary artery:   Poorly visualized. The main pulmonary artery was normal-sized. Systolic pressure was within the normal range.   ------------------------------------------------------------------- Right atrium:  The  atrium was normal in size.  ------------------------------------------------------------------- Pericardium:  The pericardium was normal in appearance. There was no pericardial effusion.  ------------------------------------------------------------------- Systemic veins: Inferior vena cava: The vessel was normal in size. The respirophasic diameter changes were in the normal range (= 50%), consistent with normal central venous pressure.  ------------------------------------------------------------------- Post procedure conclusions Ascending Aorta:  - The aorta was normal, not dilated, and non-diseased.  ------------------------------------------------------------------- Measurements   Left ventricle  Value        Reference  LV ID, ED, PLAX chordal                47.4  mm     43 - 52  LV ID, ES, PLAX chordal                34.4  mm     23 - 38  LV fx shortening, PLAX chordal (L)     27    %      >=29  LV PW thickness, ED                    12.9  mm     ----------  IVS/LV PW ratio, ED                    1.06         <=1.3  Stroke volume, 2D                      95    ml     ----------  Stroke volume/bsa, 2D                  31    ml/m^2 ----------  LV e&', medial                          9.14  cm/s   ----------  LV E/e&', medial                        13.13        ----------    Ventricular septum                     Value        Reference  IVS thickness, ED                      13.7  mm     ----------    LVOT                                   Value        Reference  LVOT ID, S                             23    mm     ----------  LVOT area                              4.15  cm^2   ----------  LVOT peak velocity, S                  132   cm/s   ----------  LVOT mean velocity, S                  95    cm/s   ----------  LVOT VTI, S                            22.9  cm     ----------  LVOT peak gradient, S  7     mm Hg  ----------     Aorta                                  Value        Reference  Aortic root ID, ED                     35    mm     ----------    Left atrium                            Value        Reference  LA ID, A-P, ES                         54    mm     ----------  LA ID/bsa, A-P                         1.76  cm/m^2 <=2.2  LA volume, S                           129   ml     ----------  LA volume/bsa, S                       42    ml/m^2 ----------  LA volume, ES, 1-p A4C                 110   ml     ----------  LA volume/bsa, ES, 1-p A4C             35.9  ml/m^2 ----------  LA volume, ES, 1-p A2C                 138   ml     ----------  LA volume/bsa, ES, 1-p A2C             45    ml/m^2 ----------    Mitral valve                           Value        Reference  Mitral E-wave peak velocity            120   cm/s   ----------  Mitral A-wave peak velocity            68.7  cm/s   ----------  Mitral deceleration time               164   ms     150 - 230  Mitral peak gradient, D                6     mm Hg  ----------  Mitral E/A ratio, peak                 1.7          ----------    Right atrium                           Value        Reference  RA ID,  S-I, ES, A4C            (H)     68.4  mm     34 - 49  RA area, ES, A4C               (H)     29.8  cm^2   8.3 - 19.5  RA volume, ES, A/L                     106   ml     ----------  RA volume/bsa, ES, A/L                 34.5  ml/m^2 ----------    Systemic veins                         Value        Reference  Estimated CVP                          3     mm Hg  ----------    Right ventricle                        Value        Reference  TAPSE                                  18.9  mm     ----------  Legend: (L)  and  (H)  mark values outside specified reference range.  ------------------------------------------------------------------- Prepared and Electronically Authenticated by  Shirlee More, MD 2018-10-23T10:31:15  Depression screen [facility]  2/9 01/23/2017  Decreased Interest 0  Down, Depressed, Hopeless 0  PHQ - 2 Score 0    Past Medical History:  Diagnosis Date  . Asthma   . Environmental allergies   . GERD (gastroesophageal reflux disease)   . Obesity    Allergies  Allergen Reactions  . Amoxicillin Rash  . Penicillins Rash   Past Surgical History:  Procedure Laterality Date  . WISDOM TOOTH EXTRACTION     Family History  Problem Relation Age of Onset  . Colon cancer Maternal Grandmother 27       early death  . Diabetes Paternal Grandmother   . Kidney disease Paternal Grandmother   . Diabetes Paternal Grandfather   . Kidney disease Paternal Grandfather   . Colon cancer Maternal Aunt 57   Social History   Socioeconomic History  . Marital status: Married    Spouse name: [name]  . Number of children: 2  . Years of education: 74  . Highest education level: Not on file  Social Needs  . Financial resource strain: Not on file  . Food insecurity - worry: Not on file  . Food insecurity - inability: Not on file  . Transportation needs - medical: Not on file  . Transportation needs - non-medical: Not on file  Occupational History  . Occupation: Regulatory affairs officer  Tobacco Use  . Smoking status: Never Smoker  . Smokeless tobacco: Never Used  Substance and Sexual Activity  . Alcohol use: Yes    Comment: occasional  . Drug use: No  . Sexual activity: Yes    Partners: Female    Birth control/protection: None  Other Topics Concern  . Not on file  Social History Narrative  Married to [place], 2 children.    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 Drinks caffeine.    Smoke alarm in the home. Wears seatbelt.    Feels safe in his relationships.    Allergies as of 02/13/2017      Reactions   Amoxicillin Rash   Penicillins Rash      Medication List        Accurate as of 02/13/17 10:12 AM. Always use your most recent med list.          albuterol 108 (90 Base)  MCG/ACT inhaler Commonly known as:  PROVENTIL HFA;VENTOLIN HFA Inhale 2 puffs into the lungs every 6 (six) hours as needed for wheezing or shortness of breath.   amLODipine 10 MG tablet Commonly known as:  NORVASC Take 1 tablet (10 mg total) by mouth daily.   aspirin EC 81 MG tablet Take 81 mg by mouth daily.   carvedilol 6.25 MG tablet Commonly known as:  COREG Take 1 tablet (6.25 mg total) by mouth 2 (two) times daily with a meal.   DYMISTA 137-50 MCG/ACT Susp Generic drug:  Azelastine-Fluticasone USE 1 SPRAY INTO EACH NOSTRIL TWICE A DAY   furosemide 20 MG tablet Commonly known as:  LASIX Take 1 tablet (20 mg total) by mouth daily.   lisinopril 40 MG tablet Commonly known as:  PRINIVIL,ZESTRIL Take 1 tablet (40 mg total) by mouth daily.   metFORMIN 500 MG tablet Commonly known as:  GLUCOPHAGE TAKE 1 TABLET (500 MG TOTAL) BY MOUTH 2 (TWO) TIMES DAILY WITH A MEAL.   Potassium Chloride ER 20 MEQ Tbcr 2 tabs daily for 4 days, then one tab daily.   QVAR REDIHALER 80 MCG/ACT inhaler Generic drug:  beclomethasone TAKE 1 PUFF BY MOUTH TWICE A DAY       All past medical history, surgical history, allergies, family history, immunizations andmedications were updated in the EMR today and reviewed under the history and medication portions of their EMR.    Recent Results (from the past 2160 hour(s))  CBC w/Diff     Status: Abnormal   Collection Time: 01/23/17  4:28 PM  Result Value Ref Range   WBC 11.3 (H) 3.8 - 10.8 Thousand/uL   RBC 4.62 4.20 - 5.80 Million/uL   Hemoglobin 12.4 (L) 13.2 - 17.1 g/dL   HCT 37.0 (L) 38.5 - 50.0 %   MCV 80.1 80.0 - 100.0 fL   MCH 26.8 (L) 27.0 - 33.0 pg   MCHC 33.5 32.0 - 36.0 g/dL   RDW 14.9 11.0 - 15.0 %   Platelets 199 140 - 400 Thousand/uL   MPV 12.2 7.5 - 12.5 fL   Neutro Abs 9,085 (H) 1,500 - 7,800 cells/uL   Lymphs Abs 1,458 850 - 3,900 cells/uL   WBC mixed population 565 200 - 950 cells/uL   Eosinophils Absolute 147 15 - 500  cells/uL   Basophils Absolute 45 0 - 200 cells/uL   Neutrophils Relative % 80.4 %   Total Lymphocyte 12.9 %   Monocytes Relative 5.0 %   Eosinophils Relative 1.3 %   Basophils Relative 0.4 %  HgB A1c     Status: Abnormal   Collection Time: 01/23/17  4:28 PM  Result Value Ref Range   Hgb A1c MFr Bld 6.4 (H) <5.7 % of total Hgb    Comment: For someone without known diabetes, a hemoglobin  A1c value between 5.7% and 6.4% is consistent with prediabetes and should be confirmed  with a  follow-up test. . For someone with known diabetes, a value <7% indicates that their diabetes is well controlled. A1c targets should be individualized based on duration of diabetes, age, comorbid conditions, and other considerations. . This assay result is consistent with an increased risk of diabetes. . Currently, no consensus exists regarding use of hemoglobin A1c for diagnosis of diabetes for children. .    Mean Plasma Glucose 137 (calc)   eAG (mmol/L) 7.6 (calc)  Comp Met (CMET)     Status: Abnormal   Collection Time: 01/23/17  4:28 PM  Result Value Ref Range   Glucose, Bld 127 (H) 65 - 99 mg/dL    Comment: .            Fasting reference interval . For someone without known diabetes, a glucose value >125 mg/dL indicates that they may have diabetes and this should be confirmed with a follow-up test. .    BUN 19 7 - 25 mg/dL   Creat 1.34 0.60 - 1.35 mg/dL   BUN/Creatinine Ratio NOT APPLICABLE 6 - 22 (calc)   Sodium 139 135 - 146 mmol/L   Potassium 3.3 (L) 3.5 - 5.3 mmol/L   Chloride 99 98 - 110 mmol/L   CO2 32 20 - 32 mmol/L   Calcium 9.6 8.6 - 10.3 mg/dL   Total Protein 7.0 6.1 - 8.1 g/dL   Albumin 4.4 3.6 - 5.1 g/dL   Globulin 2.6 1.9 - 3.7 g/dL (calc)   AG Ratio 1.7 1.0 - 2.5 (calc)   Total Bilirubin 1.3 (H) 0.2 - 1.2 mg/dL   Alkaline phosphatase (APISO) 45 40 - 115 U/L   AST 18 10 - 40 U/L   ALT 20 9 - 46 U/L  TSH     Status: None   Collection Time: 01/23/17  4:28 PM  Result  Value Ref Range   TSH 2.13 0.40 - 4.50 mIU/L  Direct LDL     Status: Abnormal   Collection Time: 01/23/17  4:28 PM  Result Value Ref Range   Direct LDL 157 (H) <100 mg/dL    Comment: Greatly elevated Triglycerides values (>1200 mg/dL) interfere with the dLDL assay. As no Triglycerides  testing was ordered, interpret results with caution. . Desirable range <100 mg/dL for primary prevention;   <70 mg/dL for patients with CHD or diabetic patients  with > or = 2 CHD risk factors. .   Iron, TIBC and Ferritin Panel     Status: None   Collection Time: 01/25/17 12:10 PM  Result Value Ref Range   Iron 65 50 - 180 mcg/dL   TIBC 390 250 - 425 mcg/dL (calc)   %SAT 17 15 - 60 % (calc)   Ferritin 125 20 - 380 ng/mL  B Nat Peptide     Status: Abnormal   Collection Time: 01/25/17 12:10 PM  Result Value Ref Range   Pro B Natriuretic peptide (BNP) 302.0 (H) 0.0 - 100.0 pg/mL  Urine Microalbumin w/creat. ratio     Status: Abnormal   Collection Time: 01/25/17 12:10 PM  Result Value Ref Range   Microalb, Ur 106.9 (H) 0.0 - 1.9 mg/dL   Creatinine,U 247.1 mg/dL   Microalb Creat Ratio 43.3 (H) 0.0 - 30.0 mg/g  Comp Met (CMET)     Status: Abnormal   Collection Time: 01/29/17  1:36 PM  Result Value Ref Range   Sodium 138 135 - 145 mEq/L   Potassium 4.1 3.5 - 5.1 mEq/L   Chloride 102 96 -  112 mEq/L   CO2 29 19 - 32 mEq/L   Glucose, Bld 148 (H) 70 - 99 mg/dL   BUN 18 6 - 23 mg/dL   Creatinine, Ser 1.17 0.40 - 1.50 mg/dL   Total Bilirubin 1.0 0.2 - 1.2 mg/dL   Alkaline Phosphatase 36 (L) 39 - 117 U/L   AST 25 0 - 37 U/L   ALT 29 0 - 53 U/L   Total Protein 6.8 6.0 - 8.3 g/dL   Albumin 4.3 3.5 - 5.2 g/dL   Calcium 9.5 8.4 - 10.5 mg/dL   GFR 73.32 >60.00 mL/min  CBC w/Diff     Status: Abnormal   Collection Time: 01/29/17  1:36 PM  Result Value Ref Range   WBC 9.5 4.0 - 10.5 K/uL   RBC 5.19 4.22 - 5.81 Mil/uL   Hemoglobin 14.1 13.0 - 17.0 g/dL   HCT 43.2 39.0 - 52.0 %   MCV 83.3 78.0 - 100.0  fl   MCHC 32.6 30.0 - 36.0 g/dL   RDW 16.5 (H) 11.5 - 15.5 %   Platelets 245.0 150.0 - 400.0 K/uL   Neutrophils Relative % 81.5 (H) 43.0 - 77.0 %   Lymphocytes Relative 9.3 (L) 12.0 - 46.0 %   Monocytes Relative 7.8 3.0 - 12.0 %   Eosinophils Relative 0.8 0.0 - 5.0 %   Basophils Relative 0.6 0.0 - 3.0 %   Neutro Abs 7.7 1.4 - 7.7 K/uL   Lymphs Abs 0.9 0.7 - 4.0 K/uL   Monocytes Absolute 0.7 0.1 - 1.0 K/uL   Eosinophils Absolute 0.1 0.0 - 0.7 K/uL   Basophils Absolute 0.1 0.0 - 0.1 K/uL  Pro b natriuretic peptide (BNP)     Status: Abnormal   Collection Time: 01/30/17 10:00 AM  Result Value Ref Range   NT-Pro BNP 258 (H) 0 - 86 pg/mL    Comment: The following cut-points have been suggested for the use of proBNP for the diagnostic evaluation of heart failure (HF) in patients with acute dyspnea: Modality                     Age           Optimal Cut                            (years)            Point ------------------------------------------------------ Diagnosis (rule in HF)        <50            450 pg/mL                           50 - 75            900 pg/mL                               >75           1800 pg/mL Exclusion (rule out HF)  Age independent     300 pg/mL   Basic metabolic panel     Status: Abnormal   Collection Time: 01/30/17 10:00 AM  Result Value Ref Range   Glucose 170 (H) 65 - 99 mg/dL   BUN 15 6 - 24 mg/dL   Creatinine, Ser 1.07 0.76 - 1.27 mg/dL   GFR calc non Af Wyvonnia Lora  86 >59 mL/min/1.73   GFR calc Af Amer 100 >59 mL/min/1.73   BUN/Creatinine Ratio 14 9 - 20   Sodium 141 134 - 144 mmol/L   Potassium 4.0 3.5 - 5.2 mmol/L   Chloride 99 96 - 106 mmol/L   CO2 27 20 - 29 mmol/L   Calcium 9.1 8.7 - 10.2 mg/dL  Lipid panel     Status: Abnormal   Collection Time: 01/31/17  8:51 AM  Result Value Ref Range   Cholesterol 194 0 - 200 mg/dL    Comment: ATP III Classification       Desirable:  < 200 mg/dL               Borderline High:  200 - 239 mg/dL          High:   > = 240 mg/dL   Triglycerides 80.0 0.0 - 149.0 mg/dL    Comment: Normal:  <150 mg/dLBorderline High:  150 - 199 mg/dL   HDL 37.80 (L) >39.00 mg/dL   VLDL 16.0 0.0 - 40.0 mg/dL   LDL Cholesterol 140 (H) 0 - 99 mg/dL   Total CHOL/HDL Ratio 5     Comment:                Men          Women1/2 Average Risk     3.4          3.3Average Risk          5.0          4.42X Average Risk          9.6          7.13X Average Risk          15.0          11.0                       NonHDL 156.12     Comment: NOTE:  Non-HDL goal should be 30 mg/dL higher than patient's LDL goal (i.e. LDL goal of < 70 mg/dL, would have non-HDL goal of < 100 mg/dL)  Pro b natriuretic peptide (BNP)     Status: Abnormal   Collection Time: 02/07/17 10:37 AM  Result Value Ref Range   NT-Pro BNP 218 (H) 0 - 86 pg/mL    Comment: The following cut-points have been suggested for the use of proBNP for the diagnostic evaluation of heart failure (HF) in patients with acute dyspnea: Modality                     Age           Optimal Cut                            (years)            Point ------------------------------------------------------ Diagnosis (rule in HF)        <50            450 pg/mL                           50 - 75            900 pg/mL                               >  75           1800 pg/mL Exclusion (rule out HF)  Age independent     300 pg/mL   Basic metabolic panel     Status: Abnormal   Collection Time: 02/07/17 10:37 AM  Result Value Ref Range   Glucose 146 (H) 65 - 99 mg/dL   BUN 13 6 - 24 mg/dL   Creatinine, Ser 1.13 0.76 - 1.27 mg/dL   GFR calc non Af Amer 81 >59 mL/min/1.73   GFR calc Af Amer 93 >59 mL/min/1.73   BUN/Creatinine Ratio 12 9 - 20   Sodium 141 134 - 144 mmol/L   Potassium 4.2 3.5 - 5.2 mmol/L   Chloride 101 96 - 106 mmol/L   CO2 25 20 - 29 mmol/L   Calcium 9.2 8.7 - 10.2 mg/dL    Patient was never admitted.   ROS: 14 pt review of systems performed and negative (unless mentioned in an  HPI)  Objective: BP (!) 158/98 (BP Location: Right Arm, Patient Position: Sitting, Cuff Size: Large)   Pulse 78   Temp 98 F (36.7 C)   Resp 20   Ht 5' 10" (1.778 m)   Wt (!) 384 lb (174.2 kg)   SpO2 97%   BMI 55.10 kg/m   Gen: Afebrile. No acute distress. Nontoxic in appearance, well-developed, well-nourished, morbidly obese Caucasian male. HENT: AT. [place].  MMM.  Eyes:Pupils Equal Round Reactive to light, Extraocular movements intact,  Conjunctiva without redness, discharge or icterus. CV: RRR no murmur, trace edema right Chest: CTAB, no wheeze or crackles Abd: Soft. Obese. NTND. BS present.  Neuro:  Normal gait. PERLA. EOMi. Alert. Oriented x3   Assessment/plan: Coltrane Tugwell is a 41 y.o. male present for establishment visit with  Essential hypertension/cardiomegaly/dyspnea on exertion/morbid obesity Acute combined systolic and diastolic heart failure (HCC)/ - Blood pressure still above goal, however he just started taking the increased dose of Lasix again. He has an appointment with cardiology in the next couple of weeks, therefore will hold on changing regimen at this time. If heart rate able to tolerate, could increase Coreg versus starting spirolactone.  - Continue amlodipine 10 mg daily, continue lisinopril 40 mg daily, continue Lasix 40 mg daily, continue Coreg 6.25 twice a day. - Continue daily baby aspirin. - Start atorvastatin 40 mg daily. Lengthy discussion on not only lowering cholesterol but also CV risk. Patient agreeable to starting atorvastatin. - eventually would like to refer to pulmonology for sleep study--> however his breathing has improved a great deal, and he is sleeping has also improved. - Patient to continue monitoring blood pressure. Follow-up in 3 months as long as following with cardiology in 2 weeks.   Note is dictated utilizing voice recognition software. Although note has been proof read prior to signing, occasional typographical errors still can be  missed. If any questions arise, please do not hesitate to call for verification.  Electronically signed by: Howard Pouch, DO [place]

## 2017-02-13 NOTE — Patient Instructions (Signed)
Add the Lipitor (atrovastatin) daily. This helps with cholesterol and decreasing the CV risk we discussed. You are doing great!!! Keep it up!    Heart Failure Heart failure means your heart has trouble pumping blood. This makes it hard for your body to work well. Heart failure is usually a long-term (chronic) condition. You must take good care of yourself and follow your doctor's treatment plan. Follow these instructions at home:  Take your heart medicine as told by your doctor. ? Do not stop taking medicine unless your doctor tells you to. ? Do not skip any dose of medicine. ? Refill your medicines before they run out. ? Take other medicines only as told by your doctor or pharmacist.  Stay active if told by your doctor. The elderly and people with severe heart failure should talk with a doctor about physical activity.  Eat heart-healthy foods. Choose foods that are without trans fat and are low in saturated fat, cholesterol, and salt (sodium). This includes fresh or frozen fruits and vegetables, fish, lean meats, fat-free or low-fat dairy foods, whole grains, and high-fiber foods. Lentils and dried peas and beans (legumes) are also good choices.  Limit salt if told by your doctor.  Cook in a healthy way. Roast, grill, broil, bake, poach, steam, or stir-fry foods.  Limit fluids as told by your doctor.  Weigh yourself every morning. Do this after you pee (urinate) and before you eat breakfast. Write down your weight to give to your doctor.  Take your blood pressure and write it down if your doctor tells you to.  Ask your doctor how to check your pulse. Check your pulse as told.  Lose weight if told by your doctor.  Stop smoking or chewing tobacco. Do not use gum or patches that help you quit without your doctor's approval.  Schedule and go to doctor visits as told.  Nonpregnant women should have no more than 1 drink a day. Men should have no more than 2 drinks a day. Talk to your  doctor about drinking alcohol.  Stop illegal drug use.  Stay current with shots (immunizations).  Manage your health conditions as told by your doctor.  Learn to manage your stress.  Rest when you are tired.  If it is really hot outside: ? Avoid intense activities. ? Use air conditioning or fans, or get in a cooler place. ? Avoid caffeine and alcohol. ? Wear loose-fitting, lightweight, and light-colored clothing.  If it is really cold outside: ? Avoid intense activities. ? Layer your clothing. ? Wear mittens or gloves, a hat, and a scarf when going outside. ? Avoid alcohol.  Learn about heart failure and get support as needed.  Get help to maintain or improve your quality of life and your ability to care for yourself as needed. Contact a doctor if:  You gain weight quickly.  You are more short of breath than usual.  You cannot do your normal activities.  You tire easily.  You cough more than normal, especially with activity.  You have any or more puffiness (swelling) in areas such as your hands, feet, ankles, or belly (abdomen).  You cannot sleep because it is hard to breathe.  You feel like your heart is beating fast (palpitations).  You get dizzy or light-headed when you stand up. Get help right away if:  You have trouble breathing.  There is a change in mental status, such as becoming less alert or not being able to focus.  You have  chest pain or discomfort.  You faint. This information is not intended to replace advice given to you by your health care provider. Make sure you discuss any questions you have with your health care provider. Document Released: 01/03/2008 Document Revised: 09/01/2015 Document Reviewed: 05/12/2012 Elsevier Interactive Patient Education  2017 [employer]

## 2017-02-15 ENCOUNTER — Encounter: Payer: Self-pay | Admitting: Family Medicine

## 2017-02-19 ENCOUNTER — Telehealth: Payer: Self-pay | Admitting: *Deleted

## 2017-02-19 MED ORDER — POTASSIUM CHLORIDE ER 20 MEQ PO TBCR: EXTENDED_RELEASE_TABLET | ORAL | 3 refills | Status: DC

## 2017-02-19 NOTE — Telephone Encounter (Signed)
Received request for refill on Klor-Con M20 . Is this something patient is going to continue daily? Looks like we just gave him 30 tabs and last K+ was 4.2 Please advise

## 2017-02-19 NOTE — Telephone Encounter (Signed)
refilled potassium, pt to continue while taking lasix.

## 2017-03-07 ENCOUNTER — Other Ambulatory Visit: Payer: Self-pay | Admitting: *Deleted

## 2017-03-07 ENCOUNTER — Encounter: Payer: Self-pay | Admitting: Cardiology

## 2017-03-07 ENCOUNTER — Ambulatory Visit: Payer: BLUE CROSS/BLUE SHIELD | Admitting: Cardiology

## 2017-03-07 VITALS — BP 170/118 | HR 56 | Ht 70.0 in | Wt 382.0 lb

## 2017-03-07 DIAGNOSIS — I5041 Acute combined systolic (congestive) and diastolic (congestive) heart failure: Secondary | ICD-10-CM

## 2017-03-07 DIAGNOSIS — I10 Essential (primary) hypertension: Secondary | ICD-10-CM

## 2017-03-07 DIAGNOSIS — I420 Dilated cardiomyopathy: Secondary | ICD-10-CM | POA: Diagnosis not present

## 2017-03-07 DIAGNOSIS — M549 Dorsalgia, unspecified: Secondary | ICD-10-CM

## 2017-03-07 NOTE — Addendum Note (Signed)
Addended by: Arville CareHUNT, Keviana Guida N on: 03/07/2017 10:59 AM   Modules accepted: Orders

## 2017-03-07 NOTE — Patient Instructions (Addendum)
Medication Instructions:  Your physician has recommended you make the following change in your medication:  A medication change will be pending your lab results. We will contact you about stopping your Potassium and starting your Spironolactone.  Labwork: Your physician recommends that you have lab work today: BMP to check your electrolytes  Testing/Procedures: Non-Cardiac CT Angiography (CTA), is a special type of CT scan that uses a computer to produce multi-dimensional views of major blood vessels throughout the body. In CT angiography, a contrast material is injected through an IV to help visualize the blood vessels  This test will be pending your lab work. If your labs come back normal we will let you know if in fact Dr. Bing MatterKrasowski plans to proceed with imaging.  Follow-Up: Your physician recommends that you schedule a follow-up appointment in: 1 month with Dr. Bing MatterKrasowski  Any Other Special Instructions Will Be Listed Below (If Applicable).     If you need a refill on your cardiac medications before your next appointment, please call your pharmacy.

## 2017-03-07 NOTE — Progress Notes (Signed)
Cardiology Office Note:    Date:  03/07/2017   ID:  Darren Carlson, DOB 1975-11-10, MRN [id number]  PCP:  Darren Carlson  Cardiologist:  Darren Balsamobert Krasowski, MD    Referring MD: Darren Carlson   Chief Complaint  Patient presents with  . Follow-up  Doing well but frustrated with the lack of weight loss  History of Present Illness:    Darren Carlson is a 41 y.o. male with nonischemic cardiomyopathy.  On appropriate medications comes today for follow-up denies having any shortness of breath tightness squeezing pressure burning chest presented with interesting complaint he complains of having pain in his back going towards the left scapula.  It is not related to exercise and has been going on and off for the last 2 weeks.  It significant enough to the point that he asked his wife to give him some massage over there but that did not help much.  He came today with a quite high blood pressure when he checked his blood pressure at home usually 140-150/70-80.  Again the biggest frustration for him is the fact that he goes to gym on the regular basis he lifts some weights and still did not see significant weight loss.  Past Medical History:  Diagnosis Date  . Asthma   . Environmental allergies   . GERD (gastroesophageal reflux disease)   . Obesity     Past Surgical History:  Procedure Laterality Date  . WISDOM TOOTH EXTRACTION      Current Medications: Current Meds  Medication Sig  . amLODipine (NORVASC) 10 MG tablet Take 1 tablet (10 mg total) by mouth daily.  Marland Kitchen. aspirin EC 81 MG tablet Take 81 mg by mouth daily.  Marland Kitchen. atorvastatin (LIPITOR) 40 MG tablet Take 1 tablet (40 mg total) daily by mouth.  . carvedilol (COREG) 6.25 MG tablet Take 1 tablet (6.25 mg total) by mouth 2 (two) times daily with a meal.  . furosemide (LASIX) 20 MG tablet Take 2 tablets (40 mg total) daily by mouth.  Marland Kitchen. KLOR-CON M20 20 MEQ tablet TAKE 2 TABS DAILY FOR 4 DAYS, THEN ONE TAB DAILY.  Marland Kitchen. lisinopril  (PRINIVIL,ZESTRIL) 40 MG tablet Take 1 tablet (40 mg total) by mouth daily.  . metFORMIN (GLUCOPHAGE) 500 MG tablet TAKE 1 TABLET (500 MG TOTAL) BY MOUTH 2 (TWO) TIMES DAILY WITH A MEAL.     Allergies:   Amoxicillin and Penicillins   Social History   Socioeconomic History  . Marital status: Married    Spouse name: [name]  . Number of children: 2  . Years of education: 2916  . Highest education level: None  Social Needs  . Financial resource strain: None  . Food insecurity - worry: None  . Food insecurity - inability: None  . Transportation needs - medical: None  . Transportation needs - non-medical: None  Occupational History  . Occupation: Sports administratorrestaurant owner  Tobacco Use  . Smoking status: Never Smoker  . Smokeless tobacco: Never Used  Substance and Sexual Activity  . Alcohol use: Yes    Comment: occasional  . Drug use: No  . Sexual activity: Yes    Partners: Female    Birth control/protection: None  Other Topics Concern  . None  Social History Narrative   Married to [place], 2 children.    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 Drinks caffeine.    Smoke alarm in the home. Wears seatbelt.    Feels safe in  his relationships.      Family History: The patient's family history includes Colon cancer (age of onset: 3735) in his maternal aunt; Colon cancer (age of onset: 1441) in his maternal grandmother; Diabetes in his paternal grandfather and paternal grandmother; Kidney disease in his paternal grandfather and paternal grandmother. ROS:   Please see the history of present illness.    All 14 point review of systems negative except as described per history of present illness  EKGs/Labs/Other Studies Reviewed:      Recent Labs: 01/23/2017: TSH 2.13 01/29/2017: ALT 29; Hemoglobin 14.1; Platelets 245.0 02/07/2017: BUN 13; Creatinine, Ser 1.13; NT-Pro BNP 218; Potassium 4.2; Sodium 141  Recent Lipid Panel    Component Value Date/Time   CHOL  194 01/31/2017 0851   TRIG 80.0 01/31/2017 0851   HDL 37.80 (L) 01/31/2017 0851   CHOLHDL 5 01/31/2017 0851   VLDL 16.0 01/31/2017 0851   LDLCALC 140 (H) 01/31/2017 0851   LDLDIRECT 157 (H) 01/23/2017 1628    Physical Exam:    VS:  BP (!) 170/118 (BP Location: Right Arm, Patient Position: Sitting, Cuff Size: Large)   Pulse (!) 56   Ht 5\' 10"  (1.778 m)   Wt (!) 382 lb (173.3 kg)   SpO2 98%   BMI 54.81 kg/m     Wt Readings from Last 3 Encounters:  03/07/17 (!) 382 lb (173.3 kg)  02/13/17 (!) 384 lb (174.2 kg)  02/07/17 (!) 383 lb 0.6 oz (173.7 kg)     GEN:  Well nourished, well developed in no acute distress HEENT: Normal NECK: No JVD; No carotid bruits LYMPHATICS: No lymphadenopathy CARDIAC: RRR, no murmurs, no rubs, no gallops RESPIRATORY:  Clear to auscultation without rales, wheezing or rhonchi  ABDOMEN: Soft, non-tender, non-distended MUSCULOSKELETAL:  No edema; No deformity  SKIN: Warm and dry LOWER EXTREMITIES: no swelling NEUROLOGIC:  Alert and oriented x 3 PSYCHIATRIC:  Normal affect   ASSESSMENT:    1. Acute combined systolic and diastolic heart failure (HCC)   2. Dilated cardiomyopathy (HCC)   3. Essential hypertension    PLAN:    In order of problems listed above:  1. Congestive heart failure: Appears to be compensated.  We will check his Chem-7 today if Chem-7 is fine we will switch him from potassium to Aldactone 12.5 or 25. 2. Essential hypertension: Hopefully will be better controlled with addition of Aldactone. 3. Pain in the chest going towards the back within last 2 weeks make me worry about a problem with the aorta.  If Chem-7 is okay I will schedule him to have a CT angiogram of his aorta. 4. We did talk about potentially having sleep study for sleep apnea he is not interested in this for now.  He said he sleeps well since the time we started therapy.  This is an issue that we will continue to talk about. 5. We also talked about potentially  evaluating him for CAD but he is able to go to gym and exercise quite aggressively with no symptoms therefore I doubt very much that this potential explanation for his cardiomyopathy.  I think the key will be to put him on right medication then which he can check his echocardiogram a few months later.  If there is no improvement then definitely CAD workup need to be pursued.   Medication Adjustments/Labs and Tests Ordered: Current medicines are reviewed at length with the patient today.  Concerns regarding medicines are outlined above.  No orders of the defined types  were placed in this encounter.  Medication changes: No orders of the defined types were placed in this encounter.   Signed, Georgeanna Lea, MD, [facility] 03/07/2017 10:43 AM    [facility]

## 2017-03-08 ENCOUNTER — Telehealth: Payer: Self-pay

## 2017-03-08 LAB — BASIC METABOLIC PANEL
BUN/Creatinine Ratio: 10 (ref 9–20)
BUN: 11 mg/dL (ref 6–24)
CALCIUM: 9.3 mg/dL (ref 8.7–10.2)
CO2: 28 mmol/L (ref 20–29)
CREATININE: 1.07 mg/dL (ref 0.76–1.27)
Chloride: 98 mmol/L (ref 96–106)
GFR calc Af Amer: 100 mL/min/{1.73_m2} (ref >59)
GFR, EST NON AFRICAN AMERICAN: 86 mL/min/{1.73_m2} (ref >59)
GLUCOSE: 137 mg/dL — AB (ref 65–99)
Potassium: 4.1 mmol/L (ref 3.5–5.2)
Sodium: 141 mmol/L (ref 134–144)

## 2017-03-08 NOTE — Telephone Encounter (Signed)
The patient called to share his concerns regarding his back pain; educated patient as to when to go the ED and seek further help. The patient was agreeable to the recommendations and is aware to expect a call on Monday regarding his appointment for his CT as well as any med changes.

## 2017-03-11 ENCOUNTER — Other Ambulatory Visit: Payer: Self-pay

## 2017-03-11 DIAGNOSIS — I517 Cardiomegaly: Secondary | ICD-10-CM

## 2017-03-11 DIAGNOSIS — R0789 Other chest pain: Secondary | ICD-10-CM | POA: Insufficient documentation

## 2017-03-11 MED ORDER — SPIRONOLACTONE 25 MG PO TABS: 25.0000 mg | ORAL_TABLET | Freq: Every day | ORAL | 1 refills | Status: DC

## 2017-03-11 NOTE — Addendum Note (Signed)
Addended by: Craige CottaANDERSON, Capria Cartaya S on: 03/11/2017 11:04 AM   Modules accepted: Orders

## 2017-03-11 NOTE — Addendum Note (Signed)
Addended by: Craige CottaANDERSON, Tanija Germani S on: 03/11/2017 01:28 PM   Modules accepted: Orders

## 2017-03-11 NOTE — Telephone Encounter (Signed)
Informed patient that his CT had been approved through insurance and that he would be contacted by the imaging department for this. The patient was also informed of his med change from potassium to aldactone. Patient did not have any further questions or concerns.

## 2017-03-12 ENCOUNTER — Ambulatory Visit (HOSPITAL_BASED_OUTPATIENT_CLINIC_OR_DEPARTMENT_OTHER)
Admission: RE | Admit: 2017-03-12 | Discharge: 2017-03-12 | Disposition: A | Discharge status: Home / Self Care | Payer: BLUE CROSS/BLUE SHIELD | Source: Ambulatory Visit | Attending: Cardiology | Admitting: Cardiology

## 2017-03-12 DIAGNOSIS — I7789 Other specified disorders of arteries and arterioles: Secondary | ICD-10-CM | POA: Diagnosis not present

## 2017-03-12 DIAGNOSIS — M549 Dorsalgia, unspecified: Secondary | ICD-10-CM | POA: Insufficient documentation

## 2017-03-12 DIAGNOSIS — I289 Disease of pulmonary vessels, unspecified: Secondary | ICD-10-CM | POA: Diagnosis not present

## 2017-03-12 DIAGNOSIS — I712 Thoracic aortic aneurysm, without rupture: Secondary | ICD-10-CM | POA: Diagnosis not present

## 2017-03-12 DIAGNOSIS — R0602 Shortness of breath: Secondary | ICD-10-CM | POA: Diagnosis not present

## 2017-03-12 MED ORDER — IOPAMIDOL (ISOVUE-370) INJECTION 76%
100.0000 mL | Freq: Once | INTRAVENOUS | Status: AC | PRN
  Administered 2017-03-12: 100 mL via INTRAVENOUS

## 2017-03-20 ENCOUNTER — Other Ambulatory Visit: Payer: Self-pay

## 2017-03-20 ENCOUNTER — Encounter (INDEPENDENT_AMBULATORY_CARE_PROVIDER_SITE_OTHER): Payer: Self-pay

## 2017-03-20 DIAGNOSIS — I10 Essential (primary) hypertension: Secondary | ICD-10-CM | POA: Diagnosis not present

## 2017-03-21 LAB — BASIC METABOLIC PANEL
BUN/Creatinine Ratio: 14 (ref 9–20)
BUN: 15 mg/dL (ref 6–24)
CHLORIDE: 98 mmol/L (ref 96–106)
CO2: 27 mmol/L (ref 20–29)
Calcium: 9.2 mg/dL (ref 8.7–10.2)
Creatinine, Ser: 1.06 mg/dL (ref 0.76–1.27)
GFR, EST AFRICAN AMERICAN: 101 mL/min/{1.73_m2} (ref >59)
GFR, EST NON AFRICAN AMERICAN: 87 mL/min/{1.73_m2} (ref >59)
Glucose: 132 mg/dL — ABNORMAL HIGH (ref 65–99)
POTASSIUM: 4.7 mmol/L (ref 3.5–5.2)
SODIUM: 140 mmol/L (ref 134–144)

## 2017-03-22 ENCOUNTER — Ambulatory Visit: Payer: BLUE CROSS/BLUE SHIELD | Admitting: Family Medicine

## 2017-03-22 ENCOUNTER — Ambulatory Visit (HOSPITAL_BASED_OUTPATIENT_CLINIC_OR_DEPARTMENT_OTHER)
Admission: RE | Admit: 2017-03-22 | Discharge: 2017-03-22 | Disposition: A | Discharge status: Home / Self Care | Payer: BLUE CROSS/BLUE SHIELD | Source: Ambulatory Visit | Attending: Family Medicine | Admitting: Family Medicine

## 2017-03-22 ENCOUNTER — Encounter: Payer: Self-pay | Admitting: Family Medicine

## 2017-03-22 VITALS — BP 156/102 | HR 69 | Temp 97.5°F | Wt 378.0 lb

## 2017-03-22 DIAGNOSIS — I712 Thoracic aortic aneurysm, without rupture, unspecified: Secondary | ICD-10-CM

## 2017-03-22 DIAGNOSIS — M47894 Other spondylosis, thoracic region: Secondary | ICD-10-CM | POA: Diagnosis not present

## 2017-03-22 DIAGNOSIS — I517 Cardiomegaly: Secondary | ICD-10-CM | POA: Diagnosis not present

## 2017-03-22 DIAGNOSIS — M546 Pain in thoracic spine: Secondary | ICD-10-CM

## 2017-03-22 DIAGNOSIS — I5041 Acute combined systolic (congestive) and diastolic (congestive) heart failure: Secondary | ICD-10-CM

## 2017-03-22 DIAGNOSIS — Z6841 Body Mass Index (BMI) 40.0 and over, adult: Secondary | ICD-10-CM | POA: Diagnosis not present

## 2017-03-22 DIAGNOSIS — I420 Dilated cardiomyopathy: Secondary | ICD-10-CM

## 2017-03-22 DIAGNOSIS — I10 Essential (primary) hypertension: Secondary | ICD-10-CM

## 2017-03-22 DIAGNOSIS — M47814 Spondylosis without myelopathy or radiculopathy, thoracic region: Secondary | ICD-10-CM | POA: Diagnosis not present

## 2017-03-22 NOTE — Progress Notes (Signed)
Darren Carlson , 08/06/1975, 41 y.o., male MRN: [id number] Patient Care Team    Relationship Specialty Notifications Start End  Natalia LeatherwoodKuneff, Renee A, DO PCP - General Family Medicine  01/23/17     Chief Complaint  Patient presents with  . Back Pain    upper back by the shoulder blade,concern about possible aortic aneurym     Subjective: Pt presents for an OV with complaints of back pain of left sided mid-thoracic back pain ( under left scapula) of  ~4 weeks duration.  Patient reports initially his symptoms started as a dull throbbing pain that would radiate across his back. He reports occasional tingling in bilateral hands when this occurs, which is  brief. His back pain is intermittent in nature and responds some to extra strength Tylenol, although does not resolve completely. He has been working out over the last couple months a great deal, at his attempt to become a more healthy weight. He does not recall a specific injury, but has been increasing his weights and cardio training. He does endorse mechanical movement seems to bring on the discomfort when it does occur. He denies chest pain, cough, hoarseness or shortness of breath, concurrently with his back pain. His blood pressure has been closely monitored here in cardiology after start of medications. He does not notice associated high blood pressures with increase in back discomfort. He is very worried about is thoracic aortic aneurysm which was found by CT 2 weeks ago.  CTA chest completed 03/12/2017 resulted with evidence of ascending thoracic aortic aneurysm measuring 4.7 x 4.4 cm. Recommendations by radiology are semiannual CTA or MRA follow-up. Pt also with h/o cardiomyopathy of unknown cause at this time.  Patient reports his blood pressures at home are approximately 130/80. He has not seen blood pressures higher in the 140s/90s since starting spirolactone. He is awaiting his cardiologist to taper this by his spiro after obtaining CMP to check  on potassium levels first.  [facility]2017  Decreased Interest 0  Down, Depressed, Hopeless 0  PHQ - 2 Score 0    Allergies  Allergen Reactions  . Amoxicillin Rash  . Penicillins Rash   Social History   Tobacco Use  . Smoking status: Never Smoker  . Smokeless tobacco: Never Used  Substance Use Topics  . Alcohol use: Yes    Comment: occasional   Past Medical History:  Diagnosis Date  . Asthma   . Environmental allergies   . GERD (gastroesophageal reflux disease)   . Obesity    Past Surgical History:  Procedure Laterality Date  . WISDOM TOOTH EXTRACTION     Family History  Problem Relation Age of Onset  . Colon cancer Maternal Grandmother 3241       early death  . Diabetes Paternal Grandmother   . Kidney disease Paternal Grandmother   . Diabetes Paternal Grandfather   . Kidney disease Paternal Grandfather   . Colon cancer Maternal Aunt 35   Allergies as of 03/22/2017      Reactions   Amoxicillin Rash   Penicillins Rash      Medication List        Accurate as of 03/22/17 11:59 PM. Always use your most recent med list.          amLODipine 10 MG tablet Commonly known as:  NORVASC Take 1 tablet (10 mg total) by mouth daily.   aspirin EC 81 MG tablet Take 81 mg by mouth daily.   atorvastatin 40  MG tablet Commonly known as:  LIPITOR Take 1 tablet (40 mg total) daily by mouth.   carvedilol 6.25 MG tablet Commonly known as:  COREG Take 1 tablet (6.25 mg total) by mouth 2 (two) times daily with a meal.   furosemide 20 MG tablet Commonly known as:  LASIX Take 2 tablets (40 mg total) daily by mouth.   lisinopril 40 MG tablet Commonly known as:  PRINIVIL,ZESTRIL Take 1 tablet (40 mg total) by mouth daily.   metFORMIN 500 MG tablet Commonly known as:  GLUCOPHAGE TAKE 1 TABLET (500 MG TOTAL) BY MOUTH 2 (TWO) TIMES DAILY WITH A MEAL.   spironolactone 25 MG tablet Commonly known as:  ALDACTONE Take 1 tablet (25 mg total) by mouth  daily.       All past medical history, surgical history, allergies, family history, immunizations andmedications were updated in the EMR today and reviewed under the history and medication portions of their EMR.     ROS: Negative, with the exception of above mentioned in HPI   Objective:  BP (!) 156/102 (BP Location: Right Arm, Patient Position: Sitting, Cuff Size: Large)   Pulse 69   Temp (!) 97.5 F (36.4 C) (Oral)   Wt (!) 378 lb (171.5 kg)   SpO2 100%   BMI 54.24 kg/m  Body mass index is 54.24 kg/m. Gen: Afebrile. No acute distress. Nontoxic in appearance, well developed, well nourished. Obese Caucasian male. HENT: AT. Edie.  MMM Eyes:Pupils Equal Round Reactive to light, Extraocular movements intact,  Conjunctiva without redness, discharge or icterus. CV: RRR no murmur, no edema Chest: CTAB, no wheeze or crackles. Good air movement, normal resp effort.  Abd: Soft. Obese. NTND.  MSK: No erythema, no soft tissue swelling. No tenderness to palpation spine or paraspinal muscles. Normal range of motion bilateral arms. Normal range of motion cervical spine. Right Rotation with mild discomfort. Negative Hawkins, negative O'Brien's, negative empty can test, negative liftoff test. Muscle strength 5/5 bilateral upper extremity. Neurovascularly intact distally. Skin: no rashes, purpura or petechiae.  Neuro: Normal gait. PERLA. EOMi. Alert. Oriented x3 Psych: Nervous, otherwise Normal affect, dress and demeanor. Normal speech. Normal thought content and judgment.  No exam data present No results found. No results found for this or any previous visit (from the past 24 hour(s)).  Assessment/Plan: Darren Carlson is a 41 y.o. male present for OV for  Morbid obesity with BMI of 50.0-59.9, adult (HCC) Essential hypertension Cardiomegaly Dilated cardiomyopathy (HCC) Acute combined systolic and diastolic heart failure (HCC) - managed closely by cardiology. BP above goal in office, however he  is rather nervous today, reports much improved BP at home in normal ranges and is awaiting cardiology to taper spiro after labs resulted, therefore will defer to cardio to manage.   Acute left-sided thoracic back pain Thoracic aortic aneurysm - Exam is rather benign today. He endorses pain under left scapula which can radiate across his back, and occasionally causes bilateral hand tingling. Patient's location of discomfort and reports of tingling sensation to bilateral hands do not correlate neurologically.  - H/o Cardiomegaly with unknown etiology and recent discovery of thoracic aortic aneurysm of significant size ~ 4.7 cm has him rather concerned today.  - We'll start with thoracic spine x-ray given location of discomfort is approximately T3-T4 left side. - Will likely refer to CTS given cardiomegaly and thoracic aortic aneurysm is concerning given his symptoms. If CTS does not feel symptoms are related, then can consider neurology referral - DG Thoracic Spine  W/Swimmers; Future   Reviewed expectations re: course of current medical issues.  Discussed self-management of symptoms.  Outlined signs and symptoms indicating need for more acute intervention.  Patient verbalized understanding and all questions were answered.  Patient received an After-Visit Summary.    Orders Placed This Encounter  Procedures  . DG Thoracic Spine W/Swimmers     Note is dictated utilizing voice recognition software. Although note has been proof read prior to signing, occasional typographical errors still can be missed. If any questions arise, please do not hesitate to call for verification.   electronically signed by:  Felix Pacinienee Kuneff, DO  [facility] [place]

## 2017-03-22 NOTE — Patient Instructions (Signed)
I am uncertain the cause of your pain. There are multiple options and we will rule out what we can that would be concerning.   Start with xray of the thoracic spine.

## 2017-03-25 ENCOUNTER — Encounter: Payer: Self-pay | Admitting: Family Medicine

## 2017-03-25 ENCOUNTER — Telehealth: Payer: Self-pay | Admitting: Family Medicine

## 2017-03-25 DIAGNOSIS — I712 Thoracic aortic aneurysm, without rupture, unspecified: Secondary | ICD-10-CM | POA: Insufficient documentation

## 2017-03-25 NOTE — Telephone Encounter (Addendum)
Please call pt: - his thoracic spine xray did show some mild arthritic changes of the thoracic spine. I am uncertain if that is what is causing his pain in his back, It could be irritated from his increased in exercise/weight lifting but I do not feel that should create the tingling sensation in his hands given the place he reports pain in his back is lower than levels that extend to the arms (neurologically). -Given the "worse case scenario" would be his discomfort is related to his aortic aneurysm, I would like to refer him to cardiothoracic surgeon for further evaluation of his aneurysm and current symptoms to be complete. This does not mean he will definitely need surgery. They will evaluate and make recommendations.  - Given his cardiac dx I would recommend he try to avoid steroid or NSAIDS at this time, since it can cause fluid retention. Tylenol is ok for discomfort. Discontinue any  heavy lifting or over shoulder activities such as described by his cardiologist.

## 2017-03-25 NOTE — Telephone Encounter (Signed)
Left message for patient to return call to review results. 

## 2017-03-25 NOTE — Telephone Encounter (Signed)
Spoke with patient reviewed lab results and instructions. Patient verbalized understanding. Patient would like the referral.

## 2017-03-25 NOTE — Telephone Encounter (Signed)
Pt called in and was given his spine x-ray results.  He does want a referral to the cardiothoracic surgeon.  Results given to him:  Thoracic spine x-ray did  Show some mild arthritic changes of the thoracic spine.  I am uncertain if that is what is causing his pain in his back.   It could be irritated from his increased in exercise/weight lifting but I do not feel that should create the tingling sensation in his hands given the place he reports pain in his back is lower than levels that extend to the arms.  (neurologically). Given the "worse case scenario" would be his discomfort is related to his aortic aneurysm, I would like to refer him to cardiothoracic surgeon for further evaluation of his aneurysm and current symptoms to be complete.   This does not mean he will definitely need surgery.  They will evaluate and make recommendations.  Given his cardiac dx I would recommend he try to avoid steroid or NSAIDS at this time, since it can cause fluid retention.   Tylenol is ok for discomfort.   Discontinue any heavy lifting or over shoulder activities such as described by his cardiologist.

## 2017-03-30 ENCOUNTER — Encounter: Payer: Self-pay | Admitting: Family Medicine

## 2017-04-10 ENCOUNTER — Ambulatory Visit: Payer: BLUE CROSS/BLUE SHIELD | Admitting: Cardiology

## 2017-04-11 ENCOUNTER — Ambulatory Visit (INDEPENDENT_AMBULATORY_CARE_PROVIDER_SITE_OTHER): Payer: BLUE CROSS/BLUE SHIELD | Admitting: Cardiology

## 2017-04-11 ENCOUNTER — Encounter: Payer: Self-pay | Admitting: Cardiology

## 2017-04-11 VITALS — BP 140/66 | HR 87 | Ht 70.0 in | Wt 373.0 lb

## 2017-04-11 DIAGNOSIS — R9431 Abnormal electrocardiogram [ECG] [EKG]: Secondary | ICD-10-CM

## 2017-04-11 DIAGNOSIS — I712 Thoracic aortic aneurysm, without rupture, unspecified: Secondary | ICD-10-CM

## 2017-04-11 DIAGNOSIS — I10 Essential (primary) hypertension: Secondary | ICD-10-CM

## 2017-04-11 DIAGNOSIS — Z6841 Body Mass Index (BMI) 40.0 and over, adult: Secondary | ICD-10-CM | POA: Diagnosis not present

## 2017-04-11 DIAGNOSIS — I420 Dilated cardiomyopathy: Secondary | ICD-10-CM | POA: Diagnosis not present

## 2017-04-11 MED ORDER — METOPROLOL SUCCINATE ER 50 MG PO TB24: 50.0000 mg | ORAL_TABLET | Freq: Every day | ORAL | 3 refills | Status: DC

## 2017-04-11 NOTE — Patient Instructions (Signed)
Medication Instructions:  Your physician has recommended you make the following change in your medication:  1) Stop your Carvedilol 2) Start Metoprolol Succinate 50 mg once daily  Labwork: None ordered  Testing/Procedures: Your physician has requested that you have an echocardiogram. Echocardiography is a painless test that uses sound waves to create images of your heart. It provides your doctor with information about the size and shape of your heart and how well your heart's chambers and valves are working. This procedure takes approximately one hour. There are no restrictions for this procedure.  Follow-Up: Your physician recommends that you schedule a follow-up appointment in: 6 weeks with Dr. Bing MatterKrasowski   Any Other Special Instructions Will Be Listed Below (If Applicable).     If you need a refill on your cardiac medications before your next appointment, please call your pharmacy.

## 2017-04-11 NOTE — Progress Notes (Signed)
Cardiology Office Note:    Date:  04/11/2017   ID:  Darren PandaSean Carlson, DOB 05/27/1975, MRN [id number]  PCP:  Natalia LeatherwoodKuneff, Renee A, DO  Cardiologist:  Gypsy Balsamobert Malaya Cagley, MD    Referring MD: Natalia LeatherwoodKuneff, Renee A, DO   Chief Complaint  Patient presents with  . 1 month follow up  Doing well  History of Present Illness:    Darren Carlson is a 42 y.o. male with cardiomyopathy ejection fraction 35-40% diagnosed a few months ago.  He been progressing quite well with medical therapy and feeling great.  However, started complaining of having some pain in the chest radiating to the back.  I eventually ended up doing CT of his chest to rule out for potential aortic catastrophe however the only thing I was able to identified is dilatation of his ascending aorta measuring 4.7 cm.  There was no evidence of rupture there was no evidence of ulceration in his aorta.  Still complained of having some pain however now it looks more like a musculoskeletal pain.  He said he can position himself certain way to get the pain better he also can point in the spine and he feel the pain is coming from that area.  He is being already referred to cardiothoracic surgeon by his primary care physician.  It will be appropriate for him to be established with cardiothoracic surgery.  He will required periodic follow-up for his ascending aortic aneurysm.  Described to have some atypical sensation in the left side of his chest that he feels like something is grabbing his heart.  That happened typically at rest does not happen while he exercise.  He goes to gym on the regular basis does treadmill and elliptical also lifts weight and I told him that now when he was diagnosed him with aortic aneurysm it will not be advisable for him to do any cephalometric exercises.  In the meantime he stop carvedilol thinking that carvedilol gave him chest pain.  He said he feels better without it.  Because of his cardiomyopathy and blood pressure which is still a bit on the  higher side I think it would be beneficial to initiate different beta-blocker.  I will put him on Toprol-XL 50 mg daily.  Past Medical History:  Diagnosis Date  . Asthma   . Environmental allergies   . GERD (gastroesophageal reflux disease)   . Obesity     Past Surgical History:  Procedure Laterality Date  . WISDOM TOOTH EXTRACTION      Current Medications: Current Meds  Medication Sig  . amLODipine (NORVASC) 10 MG tablet Take 1 tablet (10 mg total) by mouth daily.  Marland Kitchen. aspirin EC 81 MG tablet Take 81 mg by mouth daily.  Marland Kitchen. atorvastatin (LIPITOR) 40 MG tablet Take 1 tablet (40 mg total) daily by mouth.  . furosemide (LASIX) 20 MG tablet Take 2 tablets (40 mg total) daily by mouth.  Marland Kitchen. lisinopril (PRINIVIL,ZESTRIL) 40 MG tablet Take 1 tablet (40 mg total) by mouth daily.  . metFORMIN (GLUCOPHAGE) 500 MG tablet TAKE 1 TABLET (500 MG TOTAL) BY MOUTH 2 (TWO) TIMES DAILY WITH A MEAL.  Marland Kitchen. spironolactone (ALDACTONE) 25 MG tablet Take 1 tablet (25 mg total) by mouth daily.     Allergies:   Amoxicillin and Penicillins   Social History   Socioeconomic History  . Marital status: Married    Spouse name: [name]  . Number of children: 2  . Years of education: 16  . Highest education level: None  Social Needs  . Financial resource strain: None  . Food insecurity - worry: None  . Food insecurity - inability: None  . Transportation needs - medical: None  . Transportation needs - non-medical: None  Occupational History  . Occupation: Sports administrator  Tobacco Use  . Smoking status: Never Smoker  . Smokeless tobacco: Never Used  Substance and Sexual Activity  . Alcohol use: Yes    Comment: occasional  . Drug use: No  . Sexual activity: Yes    Partners: Female    Birth control/protection: None  Other Topics Concern  . None  Social History Narrative   Married to [place], 2 children.    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 Drinks caffeine.     Smoke alarm in the home. Wears seatbelt.    Feels safe in his relationships.      Family History: The patient's family history includes Colon cancer (age of onset: 72) in his maternal aunt; Colon cancer (age of onset: 35) in his maternal grandmother; Diabetes in his paternal grandfather and paternal grandmother; Kidney disease in his paternal grandfather and paternal grandmother. ROS:   Please see the history of present illness.    All 14 point review of systems negative except as described per history of present illness  EKGs/Labs/Other Studies Reviewed:      Recent Labs: 01/23/2017: TSH 2.13 01/29/2017: ALT 29; Hemoglobin 14.1; Platelets 245.0 02/07/2017: NT-Pro BNP 218 03/20/2017: BUN 15; Creatinine, Ser 1.06; Potassium 4.7; Sodium 140  Recent Lipid Panel    Component Value Date/Time   CHOL 194 01/31/2017 0851   TRIG 80.0 01/31/2017 0851   HDL 37.80 (L) 01/31/2017 0851   CHOLHDL 5 01/31/2017 0851   VLDL 16.0 01/31/2017 0851   LDLCALC 140 (H) 01/31/2017 0851   LDLDIRECT 157 (H) 01/23/2017 1628    Physical Exam:    VS:  BP 140/66   Pulse 87   Ht 5\' 10"  (1.778 m)   Wt (!) 373 lb (169.2 kg)   SpO2 97%   BMI 53.52 kg/m     Wt Readings from Last 3 Encounters:  04/11/17 (!) 373 lb (169.2 kg)  03/22/17 (!) 378 lb (171.5 kg)  03/07/17 (!) 382 lb (173.3 kg)     GEN:  Well nourished, well developed in no acute distress HEENT: Normal NECK: No JVD; No carotid bruits LYMPHATICS: No lymphadenopathy CARDIAC: RRR, no murmurs, no rubs, no gallops RESPIRATORY:  Clear to auscultation without rales, wheezing or rhonchi  ABDOMEN: Soft, non-tender, non-distended MUSCULOSKELETAL:  No edema; No deformity  SKIN: Warm and dry LOWER EXTREMITIES: no swelling NEUROLOGIC:  Alert and oriented x 3 PSYCHIATRIC:  Normal affect   ASSESSMENT:    1. Dilated cardiomyopathy (HCC)   2. Thoracic aortic aneurysm without rupture (HCC)   3. Essential hypertension   4. Abnormal EKG   5. Morbid  obesity with BMI of 50.0-59.9, adult (HCC)    PLAN:    In order of problems listed above:  1. Relative cardiomyopathy: On ACE inhibitor which I will continue, we will try to put him on beta-blocker.  He stopped carvedilol thinking that it gives him chest pain.  We will try different beta-blocker will initiate Toprol XL 50 mg daily. 2. Ascending aortic aneurysm without rupture: He is scheduled to see cardiothoracic surgeon.  So far in my opinion conservative approach will be appropriate.  We will try to control his blood pressure better, I told him not to perform isometric exercises.  He will required another CT of his chest in about 6 months. 3. Essential hypertension: Better but still a little elevated.  Hopefully addition of beta-blocker will make it more reasonable.  I will follow-up with him shortly so we can adjust his medication appropriately. 4. Abnormal EKG: We will repeat this today. 5. Morbid obesity: He works hard to lose weight and so far he is succeeding.  He lost about 10 pounds before Christmas to today which I think is a great success and I congratulated him for it and encouraged him to continue doing it.  He modify his diet he also exercise on the regular basis but again I stressed the importance of not performing isovolumic exercises again concentrating more on aerobic exercises. 6. In terms of his cardiomyopathy I will repeat his echocardiogram in about a month from today if his ejection fraction is normal then will probably do stress test to rule out ischemia as potential cause for his cardiomyopathy.  If his ejection fraction is still significantly diminished then cardiac catheterization will be more appropriate.   Medication Adjustments/Labs and Tests Ordered: Current medicines are reviewed at length with the patient today.  Concerns regarding medicines are outlined above.  No orders of the defined types were placed in this encounter.  Medication changes: No orders of the  defined types were placed in this encounter.   Signed, Georgeanna Lea, MD, [facility] 04/11/2017 11:16 AM    [facility]

## 2017-04-11 NOTE — Addendum Note (Signed)
Addended by: Arville CareHUNT, AMANDA N on: 04/11/2017 11:29 AM   Modules accepted: Orders

## 2017-04-17 ENCOUNTER — Other Ambulatory Visit (HOSPITAL_COMMUNITY): Payer: BLUE CROSS/BLUE SHIELD

## 2017-04-22 DIAGNOSIS — J019 Acute sinusitis, unspecified: Secondary | ICD-10-CM | POA: Diagnosis not present

## 2017-04-22 DIAGNOSIS — J209 Acute bronchitis, unspecified: Secondary | ICD-10-CM | POA: Diagnosis not present

## 2017-04-26 ENCOUNTER — Ambulatory Visit (HOSPITAL_BASED_OUTPATIENT_CLINIC_OR_DEPARTMENT_OTHER)
Admission: RE | Admit: 2017-04-26 | Discharge: 2017-04-26 | Disposition: A | Discharge status: Home / Self Care | Payer: BLUE CROSS/BLUE SHIELD | Source: Ambulatory Visit | Attending: Cardiology | Admitting: Cardiology

## 2017-04-26 DIAGNOSIS — Z6841 Body Mass Index (BMI) 40.0 and over, adult: Secondary | ICD-10-CM | POA: Diagnosis not present

## 2017-04-26 DIAGNOSIS — I420 Dilated cardiomyopathy: Secondary | ICD-10-CM | POA: Insufficient documentation

## 2017-04-26 DIAGNOSIS — I10 Essential (primary) hypertension: Secondary | ICD-10-CM | POA: Diagnosis not present

## 2017-04-26 MED ORDER — PERFLUTREN LIPID MICROSPHERE
1.0000 mL | INTRAVENOUS | Status: AC | PRN
  Administered 2017-04-26: 8 mL via INTRAVENOUS
  Filled 2017-04-26: qty 10

## 2017-04-26 NOTE — Progress Notes (Signed)
Echocardiogram 2D Echocardiogram with contrast has been performed.  Dorothey BasemanReel, Tyshaun Vinzant M 04/26/2017, 9:44 AM

## 2017-05-01 ENCOUNTER — Institutional Professional Consult (permissible substitution): Payer: BLUE CROSS/BLUE SHIELD | Admitting: Surgery

## 2017-05-01 ENCOUNTER — Encounter: Payer: Self-pay | Admitting: Surgery

## 2017-05-01 ENCOUNTER — Other Ambulatory Visit: Payer: Self-pay

## 2017-05-01 VITALS — BP 151/92 | HR 88 | Resp 18 | Ht 70.0 in | Wt 366.2 lb

## 2017-05-01 DIAGNOSIS — I712 Thoracic aortic aneurysm, without rupture, unspecified: Secondary | ICD-10-CM

## 2017-05-03 ENCOUNTER — Encounter: Payer: Self-pay | Admitting: Surgery

## 2017-05-03 NOTE — Progress Notes (Signed)
Cardiothoracic Surgery Consultation  PCP is Natalia Leatherwood, DO Referring Provider is Natalia Leatherwood, DO  Chief Complaint  Patient presents with  . New Patient (Initial Visit)    Thoracic aortic aneurysm, Chest CT 03/12/2017    HPI:  The patient is a 42 year old morbidly obese gentleman who reports moving to [facility] from [place] with his family last summer.  He had his family moved into a new house and everyone became ill.  He had their duct work cleaned and carpets replaced in the house completely cleaned.  After this he felt like he was improving but continued to have some shortness of breath.  He was treated for possible bronchitis.  He was having dyspnea on exertion and lower extremity edema which was new.  He had been exercising prior to this without chest pain or shortness of breath.  He says that he saw an asthma specialist and was started on medications without improvement.  He saw a new primary physician and had an echocardiogram performed which showed a moderately reduced ejection fraction of 35-40% with diffuse hypokinesis.  It showed grade 2 diastolic dysfunction.  There is no valvular heart disease.  He said that he was markedly hypertensive around this time with systolic pressures over 200.  He had a CT Angie of the chest to rule out pulmonary embolus which was negative but did show an incidental 4.7 x 4.4 cm fusiform ascending aortic aneurysm.  He was referred to cardiology and saw Dr. Bing Matter and was started on medical therapy for hypertension and cardiomyopathy.  He said that with control of his blood pressure his shortness of breath and edema resolved and he was able to start exercising again.  He has been losing a couple pounds every other week since then by watching his diet closely and exercising.  He had a follow-up echocardiogram on 04/26/2017 which showed significant improvement in his left ventricular ejection fraction to 55-65% with normal wall  motion.  There was moderate concentric LVH.    Past Medical History:  Diagnosis Date  . Asthma   . Environmental allergies   . GERD (gastroesophageal reflux disease)   . Obesity     Past Surgical History:  Procedure Laterality Date  . WISDOM TOOTH EXTRACTION      Family History  Problem Relation Age of Onset  . Colon cancer Maternal Grandmother 46       early death  . Diabetes Paternal Grandmother   . Kidney disease Paternal Grandmother   . Diabetes Paternal Grandfather   . Kidney disease Paternal Grandfather   . Colon cancer Maternal Aunt 35  There is no family history of bicuspid aortic valve disease, aortic aneurysm or dissection, or connective tissue disorder.  Social History Social History   Tobacco Use  . Smoking status: Never Smoker  . Smokeless tobacco: Never Used  Substance Use Topics  . Alcohol use: Yes    Comment: occasional  . Drug use: No  He owns a pizzeria.  Current Outpatient Medications  Medication Sig Dispense Refill  . amLODipine (NORVASC) 10 MG tablet Take 1 tablet (10 mg total) by mouth daily. 90 tablet 1  . aspirin EC 81 MG tablet Take 81 mg by mouth daily.    Marland Kitchen atorvastatin (LIPITOR) 40 MG tablet Take 1 tablet (40 mg total) daily by mouth. 90 tablet 3  . furosemide (LASIX) 20 MG tablet Take 2 tablets (40 mg total) daily by mouth. 180 tablet 1  . lisinopril (PRINIVIL,ZESTRIL)  40 MG tablet Take 1 tablet (40 mg total) by mouth daily. 90 tablet 0  . metFORMIN (GLUCOPHAGE) 500 MG tablet TAKE 1 TABLET (500 MG TOTAL) BY MOUTH 2 (TWO) TIMES DAILY WITH A MEAL.  1  . metoprolol succinate (TOPROL-XL) 50 MG 24 hr tablet Take 1 tablet (50 mg total) by mouth daily. Take with or immediately following a meal. 30 tablet 3  . spironolactone (ALDACTONE) 25 MG tablet Take 1 tablet (25 mg total) by mouth daily. 30 tablet 1   No current facility-administered medications for this visit.     Allergies  Allergen Reactions  . Amoxicillin Rash  . Penicillins Rash      Review of Systems  Constitutional: Negative.  Negative for fatigue.  HENT: Negative.   Eyes: Negative.   Respiratory: Negative for cough, chest tightness and shortness of breath.   Cardiovascular: Negative for chest pain and leg swelling.  Gastrointestinal: Negative.   Endocrine: Negative.   Genitourinary: Negative.   Musculoskeletal: Negative.   Skin: Negative.   Allergic/Immunologic: Negative.   Neurological: Positive for numbness.       In hands and feet  Hematological: Negative.   Psychiatric/Behavioral: Negative.     BP (!) 151/92 (BP Location: Right Arm, Patient Position: Sitting, Cuff Size: Large)   Pulse 88   Resp 18   Ht 5\' 10"  (1.778 m)   Wt (!) 366 lb 3.2 oz (166.1 kg)   SpO2 99% Comment: RA  BMI 52.54 kg/m  Physical Exam  Constitutional: He is oriented to person, place, and time.  Morbidly obese gentleman in no distress  HENT:  Head: Normocephalic and atraumatic.  Mouth/Throat: Oropharynx is clear and moist.  Eyes: Conjunctivae and EOM are normal. Pupils are equal, round, and reactive to light.  Neck: Normal range of motion. Neck supple. No JVD present. No thyromegaly present.  Cardiovascular: Normal rate, regular rhythm and normal heart sounds.  No murmur heard. Pulmonary/Chest: Effort normal and breath sounds normal.  Abdominal: Soft. Bowel sounds are normal. He exhibits no distension. There is no tenderness.  Musculoskeletal: Normal range of motion. He exhibits no edema.  Lymphadenopathy:    He has no cervical adenopathy.  Neurological: He is alert and oriented to person, place, and time.  Skin: Skin is warm and dry.  Psychiatric: He has a normal mood and affect.     Diagnostic Tests:  CLINICAL DATA:  Shortness of breath and chest pain  EXAM: CT ANGIOGRAPHY CHEST WITH CONTRAST  TECHNIQUE: Multidetector CT imaging of the chest was performed using the standard protocol during bolus administration of intravenous contrast. Multiplanar CT  image reconstructions and MIPs were obtained to evaluate the vascular anatomy.  CONTRAST:  ISOVUE-370 IOPAMIDOL (ISOVUE-370) INJECTION 76%  COMPARISON:  Chest radiograph January 24, 2017  FINDINGS: Cardiovascular: There is no appreciable pulmonary embolus. There is prominence of the ascending thoracic aorta with a measured transverse diameter of 4.7 x 4.4 cm. No dissection evident. Visualized great vessels appear unremarkable. Pericardium is not appreciably thickened. No pericardial effusion evident. Heart is mildly enlarged in a generalized manner. There is prominence of the main pulmonary outflow tract with a measured diameter of 3.9 cm.  Mediastinum/Nodes: Thyroid appears unremarkable. There is no appreciable thoracic adenopathy. No esophageal lesions are appreciable.  Lungs/Pleura: There is no parenchymal lung edema or consolidation. No pleural effusion or pleural thickening evident.  Upper Abdomen: Visualized upper abdominal structures appear unremarkable.  Musculoskeletal: There are no appreciable blastic or lytic bone lesions.  Review of the  MIP images confirms the above findings.  IMPRESSION: 1. Ascending thoracic aorta measures 4.7 x 4.4 cm. Ascending thoracic aortic aneurysm. Recommend semi-annual imaging followup by CTA or MRA and referral to cardiothoracic surgery if not already obtained. This recommendation follows 2010 ACCF/AHA/AATS/ACR/ASA/SCA/SCAI/SIR/STS/SVM Guidelines for the Diagnosis and Management of Patients With Thoracic Aortic Disease. Circulation. 2010; 121: W960-A540. No demonstrable thoracic aortic dissection.  2.  No demonstrable pulmonary embolus.  3. There is prominence of the main pulmonary outflow tract with a measured transverse diameter of 3.9 cm. This finding is concerning for underlying pulmonary arterial hypertension.  4.  No lung edema or consolidation.  5.  No evident thoracic adenopathy.   Electronically  Signed   By: Bretta Bang III M.D.   On: 03/12/2017 10:29      *[facility]*                       [address]                        [place], [place] [address]                            [phone]  ------------------------------------------------------------------- Transthoracic Echocardiography  Patient:    Aleksander, Edmiston MR #:       [id number] Study Date: 04/26/2017 Gender:     M Age:        41 Height:     177.8 cm Weight:     169.2 kg BSA:        2.99 m^2 Pt. Status: Room:   ATTENDING    Gypsy Balsam, MD  ORDERING     Gypsy Balsam, MD  REFERRING    Gypsy Balsam, MD  PERFORMING   [facility]  SONOGRAPHER  Jimmy Reel, RDCS  cc:  ------------------------------------------------------------------- LV EF: 55% -   65%  ------------------------------------------------------------------- Indications:      425.4 Other primary Cardiomyopathies.  ------------------------------------------------------------------- History:   PMH:  Morbid obesity.  Risk factors:  Hypertension.  ------------------------------------------------------------------- Study Conclusions  - Left ventricle: The cavity size was normal. There was moderate   concentric hypertrophy. Systolic function was normal. The   estimated ejection fraction was in the range of 55% to 65%. Wall   motion was normal; there were no regional wall motion   abnormalities. The study is not technically sufficient to allow   evaluation of LV diastolic function. - Aortic valve: Valve area (VTI): 3.44 cm^2. Valve area (Vmax):   3.05 cm^2. Valve area (Vmean): 2.95 cm^2.  Impressions:  - Normal LVEF.   Moderate LVH.   Trace MR.  ------------------------------------------------------------------- Labs, prior tests, procedures, and surgery: ECG.     Abnormal.  ------------------------------------------------------------------- Study data:  The previous study was  not available, so comparison was made to the report of 01/29/2017.  Study status:  Routine. Procedure:  The patient reported no pain pre or post test. Transthoracic echocardiography. Image quality was suboptimal. The study was technically difficult, as a result of poor sound wave transmission and body habitus. Intravenous contrast (Definity) was administered.  Study completion:  There were no complications.     Transthoracic echocardiography.  M-mode, complete 2D, spectral Doppler, and color Doppler.  Birthdate:  Patient birthdate: Nov 18, 1975.  Age:  Patient is 42 yr old.  Sex:  Gender: male. BMI: 53.5 kg/m^2.  Blood pressure:     140/66  Patient status:  Outpatient.  Study date:  Study date: 04/26/2017. Study time: 08:44 AM.  Location:  Echo laboratory.  -------------------------------------------------------------------  ------------------------------------------------------------------- Left ventricle:  The cavity size was normal. There was moderate concentric hypertrophy. Systolic function was normal. The estimated ejection fraction was in the range of 55% to 65%. Wall motion was normal; there were no regional wall motion abnormalities. The study is not technically sufficient to allow evaluation of LV diastolic function.  ------------------------------------------------------------------- Aortic valve:   Trileaflet; normal thickness leaflets. Mobility was not restricted.  Doppler:  Transvalvular velocity was within the normal range. There was no stenosis. There was no regurgitation. VTI ratio of LVOT to aortic valve: 0.7. Valve area (VTI): 3.44 cm^2. Indexed valve area (VTI): 1.15 cm^2/m^2. Peak velocity ratio of LVOT to aortic valve: 0.62. Valve area (Vmax): 3.05 cm^2. Indexed valve area (Vmax): 1.02 cm^2/m^2. Mean velocity ratio of LVOT to aortic valve: 0.6. Valve area (Vmean): 2.95 cm^2. Indexed valve area (Vmean): 0.99 cm^2/m^2.    Mean gradient (S): 8 mm Hg. Peak gradient  (S): 13 mm Hg.  ------------------------------------------------------------------- Aorta:  Aortic root: The aortic root was normal in size.  ------------------------------------------------------------------- Mitral valve:   Structurally normal valve.   Mobility was not restricted.  Doppler:  Transvalvular velocity was within the normal range. There was no evidence for stenosis. There was trivial regurgitation.    Peak gradient (D): 3 mm Hg.  ------------------------------------------------------------------- Left atrium:  The atrium was normal in size.  ------------------------------------------------------------------- Right ventricle:  The cavity size was normal. Wall thickness was normal. Systolic function was normal.  ------------------------------------------------------------------- Pulmonic valve:    Structurally normal valve.   Cusp separation was normal.  Doppler:  Transvalvular velocity was within the normal range. There was no evidence for stenosis. There was no regurgitation.  ------------------------------------------------------------------- Tricuspid valve:   Structurally normal valve.    Doppler: Transvalvular velocity was within the normal range. There was no regurgitation.  ------------------------------------------------------------------- Pulmonary artery:   The main pulmonary artery was normal-sized. Systolic pressure was within the normal range.  ------------------------------------------------------------------- Right atrium:  The atrium was normal in size.  ------------------------------------------------------------------- Pericardium:  There was no pericardial effusion.  ------------------------------------------------------------------- Systemic veins: Inferior vena cava: The vessel was normal in size.  ------------------------------------------------------------------- Measurements   Left ventricle                           Value           Reference  LV ID, ED, PLAX chordal                  50.3  mm       43 - 52  LV ID, ES, PLAX chordal                  30.4  mm       23 - 38  LV fx shortening, PLAX chordal           40    %        >=29  LV PW thickness, ED                      14.6  mm       ----------  IVS/LV PW ratio, ED                      1.08           <=1.3  Stroke volume,  2D                        114   ml       ----------  Stroke volume/bsa, 2D                    38    ml/m^2   ----------  LV e&', lateral                           6.74  cm/s     ----------  LV E/e&', lateral                         12.8           ----------  LV e&', medial                            5.33  cm/s     ----------  LV E/e&', medial                          16.19          ----------  LV e&', average                           6.04  cm/s     ----------  LV E/e&', average                         14.3           ----------  Longitudinal strain, TDI                 16    %        ----------    Ventricular septum                       Value          Reference  IVS thickness, ED                        15.8  mm       ----------    LVOT                                     Value          Reference  LVOT ID, S                               25    mm       ----------  LVOT area                                4.91  cm^2     ----------  LVOT peak velocity, S                    113   cm/s     ----------  LVOT mean velocity, S  81.8  cm/s     ----------  LVOT VTI, S                              23.2  cm       ----------  LVOT peak gradient, S                    5     mm Hg    ----------    Aortic valve                             Value          Reference  Aortic valve peak velocity, S            182   cm/s     ----------  Aortic valve mean velocity, S            136   cm/s     ----------  Aortic valve VTI, S                      33.1  cm       ----------  Aortic mean gradient, S                  8     mm Hg    ----------   Aortic peak gradient, S                  13    mm Hg    ----------  VTI ratio, LVOT/AV                       0.7            ----------  Aortic valve area, VTI                   3.44  cm^2     ----------  Aortic valve area/bsa, VTI               1.15  cm^2/m^2 ----------  Velocity ratio, peak, LVOT/AV            0.62           ----------  Aortic valve area, peak velocity         3.05  cm^2     ----------  Aortic valve area/bsa, peak              1.02  cm^2/m^2 ----------  velocity  Velocity ratio, mean, LVOT/AV            0.6            ----------  Aortic valve area, mean velocity         2.95  cm^2     ----------  Aortic valve area/bsa, mean              0.99  cm^2/m^2 ----------  velocity    Aorta                                    Value          Reference  Aortic root ID, ED  36    mm       ----------  Ascending aorta ID, A-P, S               35    mm       ----------    Left atrium                              Value          Reference  LA ID, A-P, ES                           55    mm       ----------  LA ID/bsa, A-P                           1.84  cm/m^2   <=2.2  LA volume, S                             72.4  ml       ----------  LA volume/bsa, S                         24.2  ml/m^2   ----------  LA volume, ES, 1-p A4C                   71.9  ml       ----------  LA volume/bsa, ES, 1-p A4C               24.1  ml/m^2   ----------  LA volume, ES, 1-p A2C                   73.4  ml       ----------  LA volume/bsa, ES, 1-p A2C               24.6  ml/m^2   ----------    Mitral valve                             Value          Reference  Mitral E-wave peak velocity              86.3  cm/s     ----------  Mitral A-wave peak velocity              89.7  cm/s     ----------  Mitral deceleration time          (H)    310   ms       150 - 230  Mitral peak gradient, D                  3     mm Hg    ----------  Mitral E/A ratio, peak                   1               ----------    Right atrium                             Value  Reference  RA ID, S-I, ES, A4C               (H)    58.1  mm       34 - 49  RA area, ES, A4C                  (H)    21.9  cm^2     8.3 - 19.5  RA volume, ES, A/L                       68    ml       ----------  RA volume/bsa, ES, A/L                   22.8  ml/m^2   ----------    Systemic veins                           Value          Reference  Estimated CVP                            3     mm Hg    ----------    Right ventricle                          Value          Reference  RV s&', lateral, S                        16.9  cm/s     ----------  Legend: (L)  and  (H)  mark values outside specified reference range.  ------------------------------------------------------------------- Prepared and Electronically Authenticated by  Gypsy Balsam, MD 2019-01-18T11:45:24  Impression:  This 42 year old morbidly obese gentleman presented with congestive heart failure secondary to cardiomyopathy that may have been due to uncontrolled hypertension.  His symptoms have resolved with medical treatment of his hypertension and his ejection fraction is significantly improved.  He also has a 4.7 x 4.4 cm fusiform ascending of unknown duration.  I reviewed the CTA images with him and discussed the follow-up and treatment of a sending aortic aneurysms.  In general,  5.5 cm is the threshold for surgical treatment in most patients.  This will require continued follow-up.  I discussed the importance of good blood pressure control in preventing further enlargement and aortic dissection.  I advised him against doing any heavy weight lifting of more than 35 pounds and I also advised him against taking Cipro which has been linked to enlargement of aortic aneurysms.  I encouraged him to continue modifying his risk factors with dietary modification, exercise, weight loss, and good blood pressure control.  I think he understands that he is  heading down a very difficult path if he does not reverse this process.  Plan:  He will continue follow-up with his primary physician and cardiologist and I will plan to see him back in 6 months with a CTA of the chest to reassess his a sending aortic aneurysm.  If it is stable at that time then he will have a CT scan 1 year after that.  I spent 45 minutes performing this consultation and > 50% of this time was spent face to face counseling and coordinating the care of this patient's ascending aortic  aneurysm.   Alleen Borne, MD [facility] [phone]

## 2017-05-08 ENCOUNTER — Other Ambulatory Visit: Payer: Self-pay | Admitting: *Deleted

## 2017-05-08 MED ORDER — SPIRONOLACTONE 25 MG PO TABS: 25.0000 mg | ORAL_TABLET | Freq: Every day | ORAL | 1 refills | Status: DC

## 2017-05-16 ENCOUNTER — Encounter: Payer: Self-pay | Admitting: Family Medicine

## 2017-05-16 ENCOUNTER — Ambulatory Visit: Payer: BLUE CROSS/BLUE SHIELD | Admitting: Family Medicine

## 2017-05-16 VITALS — BP 158/92 | HR 79 | Temp 97.9°F | Resp 20 | Ht 70.0 in | Wt 368.5 lb

## 2017-05-16 DIAGNOSIS — I420 Dilated cardiomyopathy: Secondary | ICD-10-CM

## 2017-05-16 DIAGNOSIS — Z6841 Body Mass Index (BMI) 40.0 and over, adult: Secondary | ICD-10-CM | POA: Diagnosis not present

## 2017-05-16 DIAGNOSIS — E8881 Metabolic syndrome: Secondary | ICD-10-CM | POA: Insufficient documentation

## 2017-05-16 DIAGNOSIS — I10 Essential (primary) hypertension: Secondary | ICD-10-CM

## 2017-05-16 DIAGNOSIS — R7303 Prediabetes: Secondary | ICD-10-CM | POA: Diagnosis not present

## 2017-05-16 DIAGNOSIS — I160 Hypertensive urgency: Secondary | ICD-10-CM

## 2017-05-16 DIAGNOSIS — I712 Thoracic aortic aneurysm, without rupture, unspecified: Secondary | ICD-10-CM

## 2017-05-16 DIAGNOSIS — R600 Localized edema: Secondary | ICD-10-CM

## 2017-05-16 LAB — POCT GLYCOSYLATED HEMOGLOBIN (HGB A1C): HEMOGLOBIN A1C: 6.3

## 2017-05-16 MED ORDER — SPIRONOLACTONE 25 MG PO TABS: 50.0000 mg | ORAL_TABLET | Freq: Every day | ORAL | 1 refills | Status: DC

## 2017-05-16 MED ORDER — AMLODIPINE BESYLATE 10 MG PO TABS: 10.0000 mg | ORAL_TABLET | Freq: Every day | ORAL | 1 refills | Status: DC

## 2017-05-16 MED ORDER — FUROSEMIDE 20 MG PO TABS: 40.0000 mg | ORAL_TABLET | Freq: Every day | ORAL | 1 refills | Status: DC

## 2017-05-16 MED ORDER — METFORMIN HCL 500 MG PO TABS: ORAL_TABLET | ORAL | 1 refills | Status: DC

## 2017-05-16 MED ORDER — LISINOPRIL 40 MG PO TABS: 40.0000 mg | ORAL_TABLET | Freq: Every day | ORAL | 1 refills | Status: DC

## 2017-05-16 NOTE — Patient Instructions (Addendum)
Monitor BP and if routinely > 135/85 increase spirolactone to 50 mg a day. If normal, wait until cariology appt to discuss.    We will call you with a1c result and discuss plan.   You are doing so much better!!!!  F/u dependent on a1c result  Have fun on vacation.

## 2017-05-16 NOTE — Progress Notes (Signed)
Patient ID: Darren Carlson, male  DOB: [birth year], 42 y.o.   MRN: [id number] Carlson Patient Care Team    Relationship Specialty Notifications Start End  Natalia LeatherwoodKuneff, Loreal Schuessler A, DO PCP - General Family Medicine  01/23/17   Alleen BorneBartle, Bryan K, MD Consulting Physician Cardiothoracic Surgery  05/03/17   Georgeanna LeaKrasowski, Robert J, MD Consulting Physician Cardiology  05/03/17     Chief Complaint  Patient presents with  . Hypertension    Subjective:  Darren Carlson is a 42 y.o.  male present for follow-up on Prediabetes/metabolic syndrome: Patient reports compliance with 500 mg twice a day. He has done rather well with his dietary and exercise changes. He has lost close to 30 pounds. Patient denies dizziness, hyperglycemic or hypoglycemic events. Patient denies numbness, tingling in the extremities or nonhealing wounds of feet. Last hemoglobin A1c 6.4 (started metformin at that time).  hypertension/hyperlipidemia/acute combined systolic and diastolic heart failure/cardiomyopathy/thoracic aortic aneurysm: Pt reports compliance with Toprol-XL 50 mg daily, amlodipine 10 mg daily, Aldactone 25 mg daily, lisinopril 40 mg daily, Lasix 40 mg daily. Blood pressures ranges at home are reported to be approximately 140s-150 systolic.  Patient denies chest pain, shortness of breath or lower extremity edema. Pt takes a daily baby ASA. Pt is  prescribed statin. Coreg recently discontinued and metoprolol XL started secondary to side effects. He has established with cardiology and CTS.  Thoracic aortic aneurysm 4.7 x 4.4 cm.  BMP: 03/20/2017 creatinine 1.06, GFR normal CBC: 01/29/2017 within normal limits Lipid: 01/31/2017 total cholesterol 194, HDL 37, LDL 140, triglycerides 80 Microalbumin: 01/25/2017 43.3 (elevated)--> recheck next visit.  Diet: Watching diet very closely, low-sodium Exercise: Routine exercise RF: Hypertension, hyperlipidemia, obesity, dilated cardiomyopathy, thoracic aortic aneurysm.  CT anterior chest  03/12/2017: IMPRESSION: 1. Ascending thoracic aorta measures 4.7 x 4.4 cm.  2.  No demonstrable pulmonary embolus. 3. There is prominence of the main pulmonary outflow tract with a measured transverse diameter of 3.9 cm. This finding is concerning for underlying pulmonary arterial hypertension. 4.  No lung edema or consolidation. 5.  No evident thoracic adenopathy.  Echocardiogram 04/26/2017: LV EF: 55-65 % Study Conclusions - Left ventricle: The cavity size was normal. There was moderate   concentric hypertrophy. Systolic function was normal. The   estimated ejection fraction was in the range of 55% to 65%. Wall   motion was normal; there were no regional wall motion   abnormalities. The study is not technically sufficient to allow   evaluation of LV diastolic function. - Aortic valve: Valve area (VTI): 3.44 cm^2. Valve area (Vmax):   3.05 cm^2. Valve area (Vmean): 2.95 cm^2. Impressions: - Normal LVEF.   Moderate LVH.   Trace MR.  Echocardiogram 01/29/2017: LV EF: 35% -   40% Study Conclusions - Left ventricle: The cavity size was normal. Wall thickness was   increased in a pattern of mild LVH. There was mild concentric   hypertrophy. Systolic function was moderately reduced. The   estimated ejection fraction was in the range of 35% to 40%.   Diffuse hypokinesis. Features are consistent with a pseudonormal   left ventricular filling pattern, with concomitant abnormal   relaxation and increased filling pressure (grade 2 diastolic   dysfunction). Doppler parameters are consistent with high   ventricular filling pressure. - Left atrium: The atrium was moderately to severely dilated. - Cardiac MRI is recommended.   Depression screen PHQ 2/9 01/23/2017  Decreased Interest 0  Down, Depressed, Hopeless 0  PHQ - 2 Score 0  Past Medical History:  Diagnosis Date  . Asthma   . Environmental allergies   . GERD (gastroesophageal reflux disease)   . Obesity    Allergies    Allergen Reactions  . Amoxicillin Rash  . Penicillins Rash   Past Surgical History:  Procedure Laterality Date  . WISDOM TOOTH EXTRACTION     Family History  Problem Relation Age of Onset  . Colon cancer Maternal Grandmother 62       early death  . Diabetes Paternal Grandmother   . Kidney disease Paternal Grandmother   . Diabetes Paternal Grandfather   . Kidney disease Paternal Grandfather   . Colon cancer Maternal Aunt 69   Social History   Socioeconomic History  . Marital status: Married    Spouse name: Darren Carlson  . Number of children: 2  . Years of education: 16  . Highest education level: Not on file  Social Needs  . Financial resource strain: Not on file  . Food insecurity - worry: Not on file  . Food insecurity - inability: Not on file  . Transportation needs - medical: Not on file  . Transportation needs - non-medical: Not on file  Occupational History  . Occupation: Sports administrator  Tobacco Use  . Smoking status: Never Smoker  . Smokeless tobacco: Never Used  Substance and Sexual Activity  . Alcohol use: Yes    Comment: occasional  . Drug use: No  . Sexual activity: Yes    Partners: Female    Birth control/protection: None  Other Topics Concern  . Not on file  Social History Narrative   Married to Darren Carlson, 2 children.    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 Drinks caffeine.    Smoke alarm in the home. Wears seatbelt.    Feels safe in his relationships.    Allergies as of 05/16/2017      Reactions   Amoxicillin Rash   Penicillins Rash      Medication List        Accurate as of 05/16/17 10:14 AM. Always use your most recent med list.          amLODipine 10 MG tablet Commonly known as:  NORVASC Take 1 tablet (10 mg total) by mouth daily.   aspirin EC 81 MG tablet Take 81 mg by mouth daily.   atorvastatin 40 MG tablet Commonly known as:  LIPITOR Take 1 tablet (40 mg total) daily by mouth.   furosemide  20 MG tablet Commonly known as:  LASIX Take 2 tablets (40 mg total) daily by mouth.   lisinopril 40 MG tablet Commonly known as:  PRINIVIL,ZESTRIL Take 1 tablet (40 mg total) by mouth daily.   metFORMIN 500 MG tablet Commonly known as:  GLUCOPHAGE TAKE 1 TABLET (500 MG TOTAL) BY MOUTH 2 (TWO) TIMES DAILY WITH A MEAL.   metoprolol succinate 50 MG 24 hr tablet Commonly known as:  TOPROL-XL Take 1 tablet (50 mg total) by mouth daily. Take with or immediately following a meal.   spironolactone 25 MG tablet Commonly known as:  ALDACTONE Take 1 tablet (25 mg total) by mouth daily.       All past medical history, surgical history, allergies, family history, immunizations andmedications were updated in the EMR today and reviewed under the history and medication portions of their EMR.    Recent Results (from the past 2160 hour(s))  Basic metabolic panel     Status: Abnormal   Collection Time: 03/07/17  11:11 AM  Result Value Ref Range   Glucose 137 (H) 65 - 99 mg/dL   BUN 11 6 - 24 mg/dL   Creatinine, Ser 1.61 0.76 - 1.27 mg/dL   GFR calc non Af Amer 86 >59 mL/min/1.73   GFR calc Af Amer 100 >59 mL/min/1.73   BUN/Creatinine Ratio 10 9 - 20   Sodium 141 134 - 144 mmol/L   Potassium 4.1 3.5 - 5.2 mmol/L   Chloride 98 96 - 106 mmol/L   CO2 28 20 - 29 mmol/L   Calcium 9.3 8.7 - 10.2 mg/dL  Basic metabolic panel     Status: Abnormal   Collection Time: 03/20/17  3:37 PM  Result Value Ref Range   Glucose 132 (H) 65 - 99 mg/dL   BUN 15 6 - 24 mg/dL   Creatinine, Ser 0.96 0.76 - 1.27 mg/dL   GFR calc non Af Amer 87 >59 mL/min/1.73   GFR calc Af Amer 101 >59 mL/min/1.73   BUN/Creatinine Ratio 14 9 - 20   Sodium 140 134 - 144 mmol/L   Potassium 4.7 3.5 - 5.2 mmol/L   Chloride 98 96 - 106 mmol/L   CO2 27 20 - 29 mmol/L   Calcium 9.2 8.7 - 10.2 mg/dL    Patient was never admitted.   ROS: 14 pt review of systems performed and negative (unless mentioned in an HPI)  Objective: BP  (!) 158/92 (BP Location: Left Arm, Patient Position: Sitting, Cuff Size: Large)   Pulse 79   Temp 97.9 F (36.6 C)   Resp 20   Ht 5\' 10"  (1.778 m)   Wt (!) 368 lb 8 oz (167.2 kg)   SpO2 98%   BMI 52.87 kg/m   Gen: Afebrile. No acute distress. Nontoxic in appearance, well-developed, well-nourished, morbidly obese Caucasian male. HENT: AT. Darren Carlson.  MMM.  Eyes:Pupils Equal Round Reactive to light, Extraocular movements intact,  Conjunctiva without redness, discharge or icterus. CV: RRR no murmur, trace right lower extremity edema, +2/4 P posterior tibialis pulses Chest: CTAB, no wheeze or crackles Abd: Soft. NTND. BS present.  Skin: no rashes, purpura or petechiae.  Neuro: Normal gait. PERLA. EOMi. Alert. Oriented x3   Assessment/plan: Darren Carlson is a 42 y.o. male present for establishment visit with  Essential hypertension/dyspnea on exertion/morbid obesity Acute combined systolic and diastolic heart failure (HCC)/thoracic aortic aneurysm. - Blood pressure still above goal. He reports compliance with current medications. He has a follow-up with cardiology in 2 weeks.  - Ejection fraction improved, return to normal. LVH, trace MR. - Thoracic aortic aneurysm monitored every 6 months with CTS. - Continue amlodipine 10 mg daily, continue lisinopril 40 mg daily, continue Lasix 40 mg daily, continue metoprolol XL 50 mg daily. - Continue Aldactone 25 mg daily. Monitor BP at home and had blood pressures routinely above 135/85 increase Aldactone to 50 mg daily. He will follow-up with cardiology in 2 weeks and which further adjustments can be made if needed. Will need to monitor potassium if increased (last 4.7). Refilled at higher dose today.  - Continue daily baby aspirin. - Continue atorvastatin 40 mg daily. - Continue follow-up per routine schedule with cardiology and cardiothoracic surgeon. - Follow-up 6 months  Prediabetes/metabolic syndrome: - A1c 6.4--> 6.3 today. He is doing really well  with his weight loss, lost almost 30 pounds. - Continue metformin 500 mg twice a day. - Continue to diet and exercise. - Repeat A1c every 6 months.  Note is dictated utilizing voice recognition  software. Although note has been proof read prior to signing, occasional typographical errors still can be missed. If any questions arise, please do not hesitate to call for verification.  Electronically signed by: Howard Pouch, DO [place]

## 2017-05-29 ENCOUNTER — Ambulatory Visit: Payer: BLUE CROSS/BLUE SHIELD | Admitting: Cardiology

## 2017-05-29 ENCOUNTER — Encounter: Payer: Self-pay | Admitting: Cardiology

## 2017-05-29 VITALS — BP 146/94 | HR 85 | Ht 70.0 in | Wt 371.8 lb

## 2017-05-29 DIAGNOSIS — I712 Thoracic aortic aneurysm, without rupture, unspecified: Secondary | ICD-10-CM

## 2017-05-29 DIAGNOSIS — R7303 Prediabetes: Secondary | ICD-10-CM

## 2017-05-29 DIAGNOSIS — I420 Dilated cardiomyopathy: Secondary | ICD-10-CM

## 2017-05-29 DIAGNOSIS — I10 Essential (primary) hypertension: Secondary | ICD-10-CM

## 2017-05-29 DIAGNOSIS — E8881 Metabolic syndrome: Secondary | ICD-10-CM

## 2017-05-29 NOTE — Patient Instructions (Signed)
Medication Instructions:  Your physician recommends that you continue on your current medications as directed. Please refer to the Current Medication list given to you today.  Labwork: Your physician recommends that you have lab work today: BMP   Testing/Procedures: None ordered  Follow-Up: Your physician recommends that you schedule a follow-up appointment in: 3 months with Dr. Krasowski   Any Other Special Instructions Will Be Listed Below (If Applicable).     If you need a refill on your cardiac medications before your next appointment, please call your pharmacy.   

## 2017-05-29 NOTE — Progress Notes (Signed)
Cardiology Office Note:    Date:  05/29/2017   ID:  Darren PandaSean Graffam, DOB 12-30-1975, MRN [id number]  PCP:  Natalia LeatherwoodKuneff, Renee A, DO  Cardiologist:  Gypsy Balsamobert Orell Hurtado, MD    Referring MD: Natalia LeatherwoodKuneff, Renee A, DO   Chief Complaint  Patient presents with  . Follow-up    after echo   Doing well  History of Present Illness:    Darren Carlson is a 42 y.o. 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  Last echocardiogram showed normalization of left ventricular ejection fraction.  He still needs to be on medication especially in view of the fact that he does have hypertension.  Recently his Aldactone was increased to 50 mg daily.  I will ask him to have Chem-7 to check on creatinine as well as potassium.  Denies have any chest pain tightness squeezing pressure burning chest.  Upset about the fact that he is not losing more weight.  Overall seems to be doing well  Past Medical History:  Diagnosis Date  . Asthma   . Environmental allergies   . GERD (gastroesophageal reflux disease)   . Obesity     Past Surgical History:  Procedure Laterality Date  . WISDOM TOOTH EXTRACTION      Current Medications: Current Meds  Medication Sig  . amLODipine (NORVASC) 10 MG tablet Take 1 tablet (10 mg total) by mouth daily.  Marland Kitchen. aspirin EC 81 MG tablet Take 81 mg by mouth daily.  Marland Kitchen. atorvastatin (LIPITOR) 40 MG tablet Take 1 tablet (40 mg total) daily by mouth.  . furosemide (LASIX) 20 MG tablet Take 2 tablets (40 mg total) by mouth daily.  Marland Kitchen. lisinopril (PRINIVIL,ZESTRIL) 40 MG tablet Take 1 tablet (40 mg total) by mouth daily.  . metFORMIN (GLUCOPHAGE) 500 MG tablet TAKE 1 TABLET (500 MG TOTAL) BY MOUTH 2 (TWO) TIMES DAILY WITH A MEAL.  . metoprolol succinate (TOPROL-XL) 50 MG 24 hr tablet Take 1 tablet (50 mg total) by mouth daily. Take with or immediately following a meal.  . spironolactone (ALDACTONE) 25 MG tablet Take 2 tablets (50 mg total) by mouth daily.     Allergies:   Amoxicillin and  Penicillins   Social History   Socioeconomic History  . Marital status: Married    Spouse name: [name]  . Number of children: 2  . Years of education: 5816  . Highest education level: None  Social Needs  . Financial resource strain: None  . Food insecurity - worry: None  . Food insecurity - inability: None  . Transportation needs - medical: None  . Transportation needs - non-medical: None  Occupational History  . Occupation: Sports administratorrestaurant owner  Tobacco Use  . Smoking status: Never Smoker  . Smokeless tobacco: Never Used  Substance and Sexual Activity  . Alcohol use: Yes    Comment: occasional  . Drug use: No  . Sexual activity: Yes    Partners: Female    Birth control/protection: None  Other Topics Concern  . None  Social History Narrative   Married to [place], 2 children.    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 Drinks caffeine.    Smoke alarm in the home. Wears seatbelt.    Feels safe in his relationships.      Family History: The patient's family history includes Colon cancer (age of onset: 6935) in his maternal aunt; Colon cancer (age of onset: 2241) in his maternal grandmother; Diabetes in his paternal grandfather and paternal  grandmother; Kidney disease in his paternal grandfather and paternal grandmother. ROS:   Please see the history of present illness.    All 14 point review of systems negative except as described per history of present illness  EKGs/Labs/Other Studies Reviewed:      Recent Labs: 01/23/2017: TSH 2.13 01/29/2017: ALT 29; Hemoglobin 14.1; Platelets 245.0 02/07/2017: NT-Pro BNP 218 03/20/2017: BUN 15; Creatinine, Ser 1.06; Potassium 4.7; Sodium 140  Recent Lipid Panel    Component Value Date/Time   CHOL 194 01/31/2017 0851   TRIG 80.0 01/31/2017 0851   HDL 37.80 (L) 01/31/2017 0851   CHOLHDL 5 01/31/2017 0851   VLDL 16.0 01/31/2017 0851   LDLCALC 140 (H) 01/31/2017 0851   LDLDIRECT 157 (H) 01/23/2017 1628     Physical Exam:    VS:  BP (!) 146/94 (BP Location: Left Arm, Patient Position: Sitting, Cuff Size: Large)   Pulse 85   Ht 5\' 10"  (1.778 m)   Wt (!) 371 lb 12.8 oz (168.6 kg)   SpO2 98%   BMI 53.35 kg/m     Wt Readings from Last 3 Encounters:  05/29/17 (!) 371 lb 12.8 oz (168.6 kg)  05/16/17 (!) 368 lb 8 oz (167.2 kg)  05/01/17 (!) 366 lb 3.2 oz (166.1 kg)     GEN:  Well nourished, well developed in no acute distress HEENT: Normal NECK: No JVD; No carotid bruits LYMPHATICS: No lymphadenopathy CARDIAC: RRR, no murmurs, no rubs, no gallops RESPIRATORY:  Clear to auscultation without rales, wheezing or rhonchi  ABDOMEN: Soft, non-tender, non-distended MUSCULOSKELETAL:  No edema; No deformity  SKIN: Warm and dry LOWER EXTREMITIES: no swelling NEUROLOGIC:  Alert and oriented x 3 PSYCHIATRIC:  Normal affect   ASSESSMENT:    1. Essential hypertension   2. Dilated cardiomyopathy (HCC)   3. Thoracic aortic aneurysm without rupture (HCC)   4. Prediabetes   5. Metabolic syndrome    PLAN:    In order of problems listed above:  1. Essential hypertension: Blood pressure elevated in the office however he check in at home and blood pressure is good.  I asked him to keep checking blood pressure at home and let me know. 2. Cardiomyopathy with normalization still we will continue beta-blocker and ACE inhibitor since he need this medication for his blood pressure. 3. Thoracic aneurysm: Had discussion with cardiac thoracic surgeon.  Continue monitoring.  Was told him not to do isometric exercises.  We will try to control his blood pressure best we can. 4. Prediabetes: Hopefully will be better with proper weight management and diet.  I do Chem-7 today make sure his potassium is fine I see him back in 3 months he will blood pressure measurements to his   Medication Adjustments/Labs and Tests Ordered: Current medicines are reviewed at length with the patient today.  Concerns regarding  medicines are outlined above.  Orders Placed This Encounter  Procedures  . Basic Metabolic Panel (BMET)   Medication changes: No orders of the defined types were placed in this encounter.   Signed, Georgeanna Lea, MD, [facility] 05/29/2017 11:15 AM    [facility]

## 2017-05-30 LAB — BASIC METABOLIC PANEL
BUN/Creatinine Ratio: 11 (ref 9–20)
BUN: 14 mg/dL (ref 6–24)
CALCIUM: 9.5 mg/dL (ref 8.7–10.2)
CHLORIDE: 100 mmol/L (ref 96–106)
CO2: 25 mmol/L (ref 20–29)
Creatinine, Ser: 1.25 mg/dL (ref 0.76–1.27)
GFR calc non Af Amer: 72 mL/min/{1.73_m2} (ref >59)
GFR, EST AFRICAN AMERICAN: 83 mL/min/{1.73_m2} (ref >59)
Glucose: 115 mg/dL — ABNORMAL HIGH (ref 65–99)
POTASSIUM: 4.5 mmol/L (ref 3.5–5.2)
Sodium: 140 mmol/L (ref 134–144)

## 2017-06-10 ENCOUNTER — Encounter (INDEPENDENT_AMBULATORY_CARE_PROVIDER_SITE_OTHER): Payer: Self-pay

## 2017-07-19 DIAGNOSIS — Z3189 Encounter for other procreative management: Secondary | ICD-10-CM | POA: Diagnosis not present

## 2017-07-31 ENCOUNTER — Other Ambulatory Visit: Payer: Self-pay

## 2017-07-31 MED ORDER — METOPROLOL SUCCINATE ER 50 MG PO TB24: 50.0000 mg | ORAL_TABLET | Freq: Every day | ORAL | 6 refills | Status: DC

## 2017-08-16 DIAGNOSIS — Z3189 Encounter for other procreative management: Secondary | ICD-10-CM | POA: Diagnosis not present

## 2017-08-30 ENCOUNTER — Ambulatory Visit: Payer: BLUE CROSS/BLUE SHIELD | Admitting: Cardiology

## 2017-09-10 ENCOUNTER — Encounter: Payer: Self-pay | Admitting: Cardiology

## 2017-09-10 ENCOUNTER — Ambulatory Visit: Payer: BLUE CROSS/BLUE SHIELD | Admitting: Cardiology

## 2017-09-10 VITALS — BP 150/76 | HR 89 | Ht 70.0 in | Wt 383.8 lb

## 2017-09-10 DIAGNOSIS — I712 Thoracic aortic aneurysm, without rupture, unspecified: Secondary | ICD-10-CM

## 2017-09-10 DIAGNOSIS — I420 Dilated cardiomyopathy: Secondary | ICD-10-CM | POA: Diagnosis not present

## 2017-09-10 DIAGNOSIS — I10 Essential (primary) hypertension: Secondary | ICD-10-CM | POA: Diagnosis not present

## 2017-09-10 DIAGNOSIS — Z6841 Body Mass Index (BMI) 40.0 and over, adult: Secondary | ICD-10-CM

## 2017-09-10 NOTE — Patient Instructions (Signed)
Medication Instructions:  Your physician recommends that you continue on your current medications as directed. Please refer to the Current Medication list given to you today.   Labwork: Your physician recommends that you return for lab work in 3-7 days before CTA chest: BMP. Please have your labs completed at [facility] nearest to you or you can come back to our office for lab work.You do not need an appointment for lab work to be completed.    Testing/Procedures: CT Angiography (CTA) chest, is a special type of CT scan that uses a computer to produce multi-dimensional views of major blood vessels throughout the body. In CT angiography, a contrast material is injected through an IV to help visualize the blood vessels.   Follow-Up: Your physician wants you to follow-up in: 3 months. You will receive a reminder letter in the mail two months in advance. If you don't receive a letter, please call our office to schedule the follow-up appointment.   If you need a refill on your cardiac medications before your next appointment, please call your pharmacy.   Thank you for choosing [facility]! Mady Gemmaatherine Lockhart, RN [phone]

## 2017-09-10 NOTE — Addendum Note (Signed)
Addended by: Crist FatLOCKHART, Autym Siess P on: 09/10/2017 10:11 AM   Modules accepted: Orders

## 2017-09-10 NOTE — Progress Notes (Signed)
Cardiology Office Note:    Date:  09/10/2017   ID:  Darren Carlson, DOB November 25, 1975, MRN [id number]  PCP:  Natalia Leatherwood, DO  Cardiologist:  Gypsy Balsam, MD    Referring MD: Natalia Leatherwood, DO   Chief Complaint  Patient presents with  . Follow-up  Doing well but frustrated with his weight  History of Present Illness:    Darren Carlson is a 42 y.o. male with history of cardiomyopathy diminished ejection fraction but latest echocardiogram showed normalization.  Another issue is a thoracic aortic aneurysm which measured 4.7 cm ascending in December 2018.  Overall he is doing fine the biggest frustration is the fact that he try to exercise walk and trying to stick with the diet but in spite of all this measures he is actually gaining weight rather than losing weight.  We spent a great deal of time talking about the volume of food about exercises I advised him to get a personal trainer. Likewise doing well no chest pain tightness squeezing pressure burning chest no shortness of breath swelling of lower extremities.  Past Medical History:  Diagnosis Date  . Asthma   . Environmental allergies   . GERD (gastroesophageal reflux disease)   . Obesity     Past Surgical History:  Procedure Laterality Date  . WISDOM TOOTH EXTRACTION      Current Medications: Current Meds  Medication Sig  . amLODipine (NORVASC) 10 MG tablet Take 1 tablet (10 mg total) by mouth daily.  Marland Kitchen atorvastatin (LIPITOR) 40 MG tablet Take 1 tablet (40 mg total) daily by mouth.  . furosemide (LASIX) 20 MG tablet Take 2 tablets (40 mg total) by mouth daily.  Marland Kitchen lisinopril (PRINIVIL,ZESTRIL) 40 MG tablet Take 1 tablet (40 mg total) by mouth daily.  . metoprolol succinate (TOPROL-XL) 50 MG 24 hr tablet Take 1 tablet (50 mg total) by mouth daily. Take with or immediately following a meal.  . spironolactone (ALDACTONE) 25 MG tablet Take 2 tablets (50 mg total) by mouth daily.     Allergies:   Amoxicillin and Penicillins    Social History   Socioeconomic History  . Marital status: Married    Spouse name: Darren Carlson  . Number of children: 2  . Years of education: 43  . Highest education level: Not on file  Occupational History  . Occupation: Sports administrator  Social Needs  . Financial resource strain: Not on file  . Food insecurity:    Worry: Not on file    Inability: Not on file  . Transportation needs:    Medical: Not on file    Non-medical: Not on file  Tobacco Use  . Smoking status: Never Smoker  . Smokeless tobacco: Never Used  Substance and Sexual Activity  . Alcohol use: Yes    Comment: occasional  . Drug use: No  . Sexual activity: Yes    Partners: Female    Birth control/protection: None  Lifestyle  . Physical activity:    Days per week: Not on file    Minutes per session: Not on file  . Stress: Not on file  Relationships  . Social connections:    Talks on phone: Not on file    Gets together: Not on file    Attends religious service: Not on file    Active member of club or organization: Not on file    Attends meetings of clubs or organizations: Not on file    Relationship status: Not on file  Other Topics  Concern  . Not on file  Social History Narrative   Married to [name], 2 children.    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 Drinks caffeine.    Smoke alarm in the home. Wears seatbelt.    Feels safe in his relationships.      Family History: The patient's family history includes Colon cancer (age of onset: 3035) in his maternal aunt; Colon cancer (age of onset: 6141) in his maternal grandmother; Diabetes in his paternal grandfather and paternal grandmother; Kidney disease in his paternal grandfather and paternal grandmother. ROS:   Please see the history of present illness.    All 14 point review of systems negative except as described per history of present illness  EKGs/Labs/Other Studies Reviewed:      Recent Labs: 01/23/2017: TSH  2.13 01/29/2017: ALT 29; Hemoglobin 14.1; Platelets 245.0 02/07/2017: NT-Pro BNP 218 05/29/2017: BUN 14; Creatinine, Ser 1.25; Potassium 4.5; Sodium 140  Recent Lipid Panel    Component Value Date/Time   CHOL 194 01/31/2017 0851   TRIG 80.0 01/31/2017 0851   HDL 37.80 (L) 01/31/2017 0851   CHOLHDL 5 01/31/2017 0851   VLDL 16.0 01/31/2017 0851   LDLCALC 140 (H) 01/31/2017 0851   LDLDIRECT 157 (H) 01/23/2017 1628    Physical Exam:    VS:  BP (!) 150/76   Pulse 89   Ht 5\' 10"  (1.778 m)   Wt (!) 383 lb 12.8 oz (174.1 kg)   SpO2 99%   BMI 55.07 kg/m     Wt Readings from Last 3 Encounters:  09/10/17 (!) 383 lb 12.8 oz (174.1 kg)  05/29/17 (!) 371 lb 12.8 oz (168.6 kg)  05/16/17 (!) 368 lb 8 oz (167.2 kg)     GEN:  Well nourished, well developed in no acute distress HEENT: Normal NECK: No JVD; No carotid bruits LYMPHATICS: No lymphadenopathy CARDIAC: RRR, no murmurs, no rubs, no gallops RESPIRATORY:  Clear to auscultation without rales, wheezing or rhonchi  ABDOMEN: Soft, non-tender, non-distended MUSCULOSKELETAL:  No edema; No deformity  SKIN: Warm and dry LOWER EXTREMITIES: no swelling NEUROLOGIC:  Alert and oriented x 3 PSYCHIATRIC:  Normal affect   ASSESSMENT:    1. Thoracic aortic aneurysm without rupture (HCC)   2. Essential hypertension   3. Dilated cardiomyopathy (HCC)   4. Morbid obesity with BMI of 50.0-59.9, adult (HCC)    PLAN:    In order of problems listed above:  1. Thoracic aortic aneurysm measuring 4.7 in December 2018.  Will schedule him to have another CT of the chest to look at the aneurysm again. 2. Essential hypertension slightly elevated today but he check his blood pressure at home and always good.  We will continue present management 3. History of dilated cardia myopathy with improvement.  On appropriate medications which I will continue. 4. Obesity: Discussion as above.   Medication Adjustments/Labs and Tests Ordered: Current medicines  are reviewed at length with the patient today.  Concerns regarding medicines are outlined above.  No orders of the defined types were placed in this encounter.  Medication changes: No orders of the defined types were placed in this encounter.   Signed, Georgeanna Leaobert J. Krasowski, MD, [facility] 09/10/2017 10:02 AM    [facility]

## 2017-09-13 DIAGNOSIS — I10 Essential (primary) hypertension: Secondary | ICD-10-CM | POA: Diagnosis not present

## 2017-09-13 DIAGNOSIS — I712 Thoracic aortic aneurysm, without rupture: Secondary | ICD-10-CM | POA: Diagnosis not present

## 2017-09-13 DIAGNOSIS — Z3189 Encounter for other procreative management: Secondary | ICD-10-CM | POA: Diagnosis not present

## 2017-09-14 LAB — BASIC METABOLIC PANEL
BUN/Creatinine Ratio: 13 (ref 9–20)
BUN: 16 mg/dL (ref 6–24)
CALCIUM: 9 mg/dL (ref 8.7–10.2)
CHLORIDE: 101 mmol/L (ref 96–106)
CO2: 26 mmol/L (ref 20–29)
Creatinine, Ser: 1.24 mg/dL (ref 0.76–1.27)
GFR calc Af Amer: 84 mL/min/{1.73_m2} (ref >59)
GFR calc non Af Amer: 72 mL/min/{1.73_m2} (ref >59)
GLUCOSE: 159 mg/dL — AB (ref 65–99)
POTASSIUM: 4.7 mmol/L (ref 3.5–5.2)
SODIUM: 140 mmol/L (ref 134–144)

## 2017-09-17 ENCOUNTER — Ambulatory Visit (INDEPENDENT_AMBULATORY_CARE_PROVIDER_SITE_OTHER)
Admission: RE | Admit: 2017-09-17 | Discharge: 2017-09-17 | Disposition: A | Discharge status: Home / Self Care | Payer: BLUE CROSS/BLUE SHIELD | Source: Ambulatory Visit | Attending: Cardiology | Admitting: Cardiology

## 2017-09-17 DIAGNOSIS — I712 Thoracic aortic aneurysm, without rupture: Secondary | ICD-10-CM | POA: Diagnosis not present

## 2017-09-17 MED ORDER — IOPAMIDOL (ISOVUE-370) INJECTION 76%
100.0000 mL | Freq: Once | INTRAVENOUS | Status: AC | PRN
  Administered 2017-09-17: 100 mL via INTRAVENOUS

## 2017-09-25 ENCOUNTER — Other Ambulatory Visit: Payer: Self-pay | Admitting: *Deleted

## 2017-09-25 DIAGNOSIS — I712 Thoracic aortic aneurysm, without rupture, unspecified: Secondary | ICD-10-CM

## 2017-09-26 ENCOUNTER — Encounter: Payer: Self-pay | Admitting: Cardiology

## 2017-09-26 ENCOUNTER — Encounter: Payer: Self-pay | Admitting: Surgery

## 2017-10-31 ENCOUNTER — Encounter: Payer: Self-pay | Admitting: *Deleted

## 2017-10-31 ENCOUNTER — Other Ambulatory Visit: Payer: Self-pay | Admitting: *Deleted

## 2017-10-31 ENCOUNTER — Other Ambulatory Visit: Payer: Self-pay

## 2017-10-31 DIAGNOSIS — I160 Hypertensive urgency: Secondary | ICD-10-CM

## 2017-10-31 MED ORDER — METOPROLOL SUCCINATE ER 50 MG PO TB24: 50.0000 mg | ORAL_TABLET | Freq: Every day | ORAL | 6 refills | Status: DC

## 2017-10-31 MED ORDER — AMLODIPINE BESYLATE 10 MG PO TABS: 10.0000 mg | ORAL_TABLET | Freq: Every day | ORAL | 0 refills | Status: DC

## 2017-10-31 NOTE — Telephone Encounter (Signed)
30 day supply of amlodipine sent to pharmacy patient needs office visit prior to any additional refills. [facility]

## 2017-11-06 ENCOUNTER — Other Ambulatory Visit: Payer: Self-pay

## 2017-11-06 ENCOUNTER — Ambulatory Visit: Payer: BLUE CROSS/BLUE SHIELD | Admitting: Surgery

## 2017-11-06 ENCOUNTER — Other Ambulatory Visit: Payer: BLUE CROSS/BLUE SHIELD

## 2017-11-06 MED ORDER — ATORVASTATIN CALCIUM 40 MG PO TABS: 40.0000 mg | ORAL_TABLET | Freq: Every day | ORAL | 0 refills | Status: DC

## 2017-11-21 ENCOUNTER — Other Ambulatory Visit: Payer: Self-pay | Admitting: *Deleted

## 2017-11-21 MED ORDER — SPIRONOLACTONE 25 MG PO TABS: 50.0000 mg | ORAL_TABLET | Freq: Every day | ORAL | 0 refills | Status: DC

## 2017-11-25 ENCOUNTER — Telehealth: Payer: Self-pay

## 2017-11-25 NOTE — Telephone Encounter (Signed)
Message left on voice mail for patient to call back to schedule an appointment for follow up on HTN. Refill request received from pharmacy and will fill enough medication (amlodipine) until appointment.

## 2017-12-04 ENCOUNTER — Ambulatory Visit: Payer: BLUE CROSS/BLUE SHIELD | Admitting: Surgery

## 2017-12-12 ENCOUNTER — Other Ambulatory Visit: Payer: Self-pay

## 2017-12-12 DIAGNOSIS — R600 Localized edema: Secondary | ICD-10-CM

## 2017-12-12 MED ORDER — FUROSEMIDE 20 MG PO TABS: 40.0000 mg | ORAL_TABLET | Freq: Every day | ORAL | 0 refills | Status: DC

## 2017-12-12 NOTE — Telephone Encounter (Signed)
Medication sent in for refill as requested.

## 2017-12-12 NOTE — Telephone Encounter (Addendum)
Pt has an appt on 12-16-17. Please refill amlodipine and furosemide . [facility]

## 2017-12-13 ENCOUNTER — Ambulatory Visit: Payer: BLUE CROSS/BLUE SHIELD | Admitting: Family Medicine

## 2017-12-16 ENCOUNTER — Encounter: Payer: Self-pay | Admitting: Family Medicine

## 2017-12-16 ENCOUNTER — Ambulatory Visit: Payer: BLUE CROSS/BLUE SHIELD | Admitting: Family Medicine

## 2017-12-16 VITALS — BP 126/83 | HR 85 | Temp 97.8°F | Resp 20 | Ht 70.0 in | Wt 382.0 lb

## 2017-12-16 DIAGNOSIS — Z6841 Body Mass Index (BMI) 40.0 and over, adult: Secondary | ICD-10-CM

## 2017-12-16 DIAGNOSIS — I160 Hypertensive urgency: Secondary | ICD-10-CM | POA: Diagnosis not present

## 2017-12-16 DIAGNOSIS — R7303 Prediabetes: Secondary | ICD-10-CM

## 2017-12-16 DIAGNOSIS — I10 Essential (primary) hypertension: Secondary | ICD-10-CM

## 2017-12-16 DIAGNOSIS — R600 Localized edema: Secondary | ICD-10-CM

## 2017-12-16 DIAGNOSIS — I420 Dilated cardiomyopathy: Secondary | ICD-10-CM

## 2017-12-16 DIAGNOSIS — E8881 Metabolic syndrome: Secondary | ICD-10-CM

## 2017-12-16 LAB — POCT GLYCOSYLATED HEMOGLOBIN (HGB A1C): HbA1c, POC (prediabetic range): 5.8 % (ref 5.7–6.4)

## 2017-12-16 MED ORDER — LISINOPRIL 40 MG PO TABS: 40.0000 mg | ORAL_TABLET | Freq: Every day | ORAL | 1 refills | Status: DC

## 2017-12-16 MED ORDER — FUROSEMIDE 20 MG PO TABS: 40.0000 mg | ORAL_TABLET | Freq: Every day | ORAL | 0 refills | Status: DC

## 2017-12-16 MED ORDER — ATORVASTATIN CALCIUM 40 MG PO TABS: 40.0000 mg | ORAL_TABLET | Freq: Every day | ORAL | 1 refills | Status: DC

## 2017-12-16 MED ORDER — SPIRONOLACTONE 25 MG PO TABS: 50.0000 mg | ORAL_TABLET | Freq: Every day | ORAL | 1 refills | Status: DC

## 2017-12-16 MED ORDER — AMLODIPINE BESYLATE 10 MG PO TABS: 10.0000 mg | ORAL_TABLET | Freq: Every day | ORAL | 1 refills | Status: DC

## 2017-12-16 NOTE — Progress Notes (Signed)
Patient ID: Darren Carlson, male  DOB: [birth year], 42 y.o.   MRN: [id number] Patient Care Team    Relationship Specialty Notifications Start End  Natalia Leatherwood, DO PCP - General Family Medicine  01/23/17   Alleen Borne, MD Consulting Physician Cardiothoracic Surgery  05/03/17   Georgeanna Lea, MD Consulting Physician Cardiology  05/03/17     Chief Complaint  Patient presents with  . Hypertension  . prediabetes    Subjective:  Darren Carlson is a 42 y.o.  male present for follow-up on Prediabetes/metabolic syndrome: Patient reports he is not taking metformin. Never routinely took. Had some GI effects. Last a1c 6.3. He has been exercising routinely and strictly watching his diet.  Patient denies dizziness, hyperglycemic or hypoglycemic events. Patient denies numbness, tingling in the extremities or nonhealing wounds of feet.   hypertension/hyperlipidemia/acute combined systolic and diastolic heart failure/cardiomyopathy Pt reports compliance with Toprol-XL 50 mg daily, amlodipine 10 mg daily, Aldactone 50 mg daily, lisinopril 40 mg daily, Lasix 40 mg daily. Patient denies chest pain, shortness of breath, dizziness or lower extremity edema.  Pt takes a daily baby ASA. Pt is  prescribed statin. Coreg recently discontinued and metoprolol XL started secondary to side effects. He has established with cardiology and CTS.  Thoracic aortic aneurysm 4.7 x 4.4 cm. --> on rpt image it is not felt he has an aneurysm. He is at the upper limits of normal. Original image felt to have artifact. BMP: 03/20/2017 creatinine 1.06, GFR normal CBC: 01/29/2017 within normal limits Lipid: 01/31/2017 total cholesterol 194, HDL 37, LDL 140, triglycerides 80 Microalbumin: 01/25/2017 43.3 (elevated)--> on ace Diet: Watching diet very closely, low-sodium Exercise: Routine exercise RF: Hypertension, hyperlipidemia, obesity, dilated cardiomyopathy, thoracic aortic aneurysm.  CTA 09/17/2017: CLINICAL DATA:   Follow-up of aneurysmal disease of the ascending thoracic aorta. EXAM: CT ANGIOGRAPHY CHEST WITH CONTRAST CONTRAST:  ISOVUE-370 IOPAMIDOL (ISOVUE-370) INJECTION 76% COMPARISON:  03/22/2017 IMPRESSION: Previous aortic dimensions are felt to be overestimated due to motion artifact. The ascending thoracic aorta actually measures 4 cm in greatest diameter, which is at the upper limits of normal.  CT anterior chest 03/12/2017: IMPRESSION: 1. Ascending thoracic aorta measures 4.7 x 4.4 cm.  2.  No demonstrable pulmonary embolus. 3. There is prominence of the main pulmonary outflow tract with a measured transverse diameter of 3.9 cm. This finding is concerning for underlying pulmonary arterial hypertension. 4.  No lung edema or consolidation. 5.  No evident thoracic adenopathy.  Echocardiogram 04/26/2017: LV EF: 55-65 % Study Conclusions - Left ventricle: The cavity size was normal. There was moderate   concentric hypertrophy. Systolic function was normal. The   estimated ejection fraction was in the range of 55% to 65%. Wall   motion was normal; there were no regional wall motion   abnormalities. The study is not technically sufficient to allow   evaluation of LV diastolic function. - Aortic valve: Valve area (VTI): 3.44 cm^2. Valve area (Vmax):   3.05 cm^2. Valve area (Vmean): 2.95 cm^2. Impressions: - Normal LVEF.   Moderate LVH.   Trace MR.  Echocardiogram 01/29/2017: LV EF: 35% -   40% Study Conclusions - Left ventricle: The cavity size was normal. Wall thickness was   increased in a pattern of mild LVH. There was mild concentric   hypertrophy. Systolic function was moderately reduced. The   estimated ejection fraction was in the range of 35% to 40%.   Diffuse hypokinesis. Features are consistent with a pseudonormal  left ventricular filling pattern, with concomitant abnormal   relaxation and increased filling pressure (grade 2 diastolic   dysfunction). Doppler  parameters are consistent with high   ventricular filling pressure. - Left atrium: The atrium was moderately to severely dilated. - Cardiac MRI is recommended.   Depression screen [facility] 2/9 12/16/2017 01/23/2017  Decreased Interest 0 0  Down, Depressed, Hopeless 0 0  PHQ - 2 Score 0 0    Past Medical History:  Diagnosis Date  . Asthma   . Environmental allergies   . GERD (gastroesophageal reflux disease)   . Obesity    Allergies  Allergen Reactions  . Amoxicillin Rash  . Penicillins Rash   Past Surgical History:  Procedure Laterality Date  . WISDOM TOOTH EXTRACTION     Family History  Problem Relation Age of Onset  . Colon cancer Maternal Grandmother 58       early death  . Diabetes Paternal Grandmother   . Kidney disease Paternal Grandmother   . Diabetes Paternal Grandfather   . Kidney disease Paternal Grandfather   . Colon cancer Maternal Aunt 60   Social History   Socioeconomic History  . Marital status: Married    Spouse name: Darren Carlson  . Number of children: 2  . Years of education: 92  . Highest education level: Not on file  Occupational History  . Occupation: Sports administrator  Social Needs  . Financial resource strain: Not on file  . Food insecurity:    Worry: Not on file    Inability: Not on file  . Transportation needs:    Medical: Not on file    Non-medical: Not on file  Tobacco Use  . Smoking status: Never Smoker  . Smokeless tobacco: Never Used  Substance and Sexual Activity  . Alcohol use: Yes    Comment: occasional  . Drug use: No  . Sexual activity: Yes    Partners: Female    Birth control/protection: None  Lifestyle  . Physical activity:    Days per week: Not on file    Minutes per session: Not on file  . Stress: Not on file  Relationships  . Social connections:    Talks on phone: Not on file    Gets together: Not on file    Attends religious service: Not on file    Active member of club or organization: Not on file    Attends  meetings of clubs or organizations: Not on file    Relationship status: Not on file  . Intimate partner violence:    Fear of current or ex partner: Not on file    Emotionally abused: Not on file    Physically abused: Not on file    Forced sexual activity: Not on file  Other Topics Concern  . Not on file  Social History Narrative   Married to [place], 2 children.    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 Drinks caffeine.    Smoke alarm in the home. Wears seatbelt.    Feels safe in his relationships.    Allergies as of 12/16/2017      Reactions   Amoxicillin Rash   Penicillins Rash      Medication List        Accurate as of 12/16/17  9:43 AM. Always use your most recent med list.          amLODipine 10 MG tablet Commonly known as:  NORVASC Take 1 tablet (10 mg total)  by mouth daily. Needs office visit prior to any additional refills   atorvastatin 40 MG tablet Commonly known as:  LIPITOR Take 1 tablet (40 mg total) by mouth daily.   furosemide 20 MG tablet Commonly known as:  LASIX Take 2 tablets (40 mg total) by mouth daily.   lisinopril 40 MG tablet Commonly known as:  PRINIVIL,ZESTRIL Take 1 tablet (40 mg total) by mouth daily.   metFORMIN 500 MG tablet Commonly known as:  GLUCOPHAGE TAKE 1 TABLET (500 MG TOTAL) BY MOUTH 2 (TWO) TIMES DAILY WITH A MEAL.   metoprolol succinate 50 MG 24 hr tablet Commonly known as:  TOPROL-XL Take 1 tablet (50 mg total) by mouth daily. Take with or immediately following a meal.   spironolactone 25 MG tablet Commonly known as:  ALDACTONE Take 2 tablets (50 mg total) by mouth daily. Needs office visit prior to any additional refills.       All past medical history, surgical history, allergies, family history, immunizations andmedications were updated in the EMR today and reviewed under the history and medication portions of their EMR.    No results found for this or any previous visit (from  the past 2160 hour(s)).  Patient was never admitted.   ROS: 14 pt review of systems performed and negative (unless mentioned in an HPI)  Objective: BP 126/83 (BP Location: Right Arm, Patient Position: Sitting, Cuff Size: Large)   Pulse 85   Temp 97.8 F (36.6 C)   Resp 20   Ht 5\' 10"  (1.778 m)   Wt (!) 382 lb (173.3 kg)   SpO2 99%   BMI 54.81 kg/m   Gen: Afebrile. No acute distress. Nontoxic, obese caucasian male.  HENT: AT. [place].  MMM.  Eyes:Pupils Equal Round Reactive to light, Extraocular movements intact,  Conjunctiva without redness, discharge or icterus. CV: RRR no murmur, trace (right)edema, +2/4 P posterior tibialis pulses Chest: CTAB, no wheeze or crackles Abd: Soft. obese. NTND. BS present Skin: no rashes, purpura or petechiae. Bilateral LE hyperpigmentation present.  Neuro: Normal gait. PERLA. EOMi. Alert. Oriented x3 Psych: Normal affect, dress and demeanor. Normal speech. Normal thought content and judgment.   Assessment/plan: Darren Carlson is a 42 y.o. male present for establishment visit with  Essential hypertension/dyspnea on exertion/morbid obesity Acute combined systolic and diastolic heart failure (HCC) - Stable. Refills provided today. - Ejection fraction improved, return to normal. LVH, trace MR. - Thoracic aortic aneurysm --> repeat imaging felt he did not have an  aneurysm and original was artifact.  - Continue amlodipine 10 mg daily, continue lisinopril 40 mg daily, continue Lasix 40 mg daily, continue metoprolol XL 50 mg daily.Continue Aldactone 50 mg daily.   - Continue daily baby aspirin. - Continue atorvastatin 40 mg daily. - Continue follow-up per routine schedule with cardiology and cardiothoracic surgeon. - Follow-up 6 months  Prediabetes/metabolic syndrome: - A1c 6.4--> 6.3 --> 5.8.  Great job.  - He DC'd metformin on his own. He did not like the GI discomfort.  - Continue to diet and exercise. Needs to increase his cardio. His weight is the  same, but his size is down.  - discussed weight loss medication with him today. He will look into non-stimulant medication coverage with contrave or belviq if interested. Will start if responds within 4 weeks, with close follow up after initiation of med.    - Repeat A1c every 12 mos.  Note is dictated utilizing voice recognition software. Although note has been proof read prior to  signing, occasional typographical errors still can be missed. If any questions arise, please do not hesitate to call for verification.  Electronically signed by: Felix Pacini, DO [facility]

## 2017-12-16 NOTE — Patient Instructions (Addendum)
Your blood pressure and "prediabetes" are well controlled.  A1c is 5.8, this is great.  If you  Are interested in medication for weight loss, check you insurance on the generic forms of Belviq or Contrave.   Keep up the good work.  See you in 6 months, unless you need me sooner.   Please help [place] help you:  We are honored you have chosen [facility] for your Primary Care home. Below you will find basic instructions that you may need to access in the future. Please help [place] help you by reading the instructions, which cover many of the frequent questions we experience.   Prescription refills and request:  -In order to allow more efficient response time, please call your pharmacy for all refills. They will forward the request electronically to [place]. This allows for the quickest possible response. Request left on a nurse line can take longer to refill, since these are checked as time allows between office patients and other phone calls.  - refill request can take up to 3-5 working days to complete.  - If request is sent electronically and request is appropiate, it is usually completed in 1-2 business days.  - all patients will need to be seen routinely for all chronic medical conditions requiring prescription medications (see follow-up below). If you are overdue for follow up on your condition, you will be asked to make an appointment and we will call in enough medication to cover you until your appointment (up to 30 days).  - all controlled substances will require a face to face visit to request/refill.  - if you desire your prescriptions to go through a new pharmacy, and have an active script at original pharmacy, you will need to call your pharmacy and have scripts transferred to new pharmacy. This is completed between the pharmacy locations and not by your provider.    Results: If any images or labs were ordered, it can take up to 1 week to get results depending on the test ordered and the  lab/facility running and resulting the test. - Normal or stable results, which do not need further discussion, may be released to your mychart immediately with attached note to you. A call may not be generated for normal results. Please make certain to sign up for mychart. If you have questions on how to activate your mychart you can call the front office.  - If your results need further discussion, our office will attempt to contact you via phone, and if unable to reach you after 2 attempts, we will release your abnormal result to your mychart with instructions.  - All results will be automatically released in mychart after 1 week.  - Your provider will provide you with explanation and instruction on all relevant material in your results. Please keep in mind, results and labs may appear confusing or abnormal to the untrained eye, but it does not mean they are actually abnormal for you personally. If you have any questions about your results that are not covered, or you desire more detailed explanation than what was provided, you should make an appointment with your provider to do so.   Our office handles many outgoing and incoming calls daily. If we have not contacted you within 1 week about your results, please check your mychart to see if there is a message first and if not, then contact our office.  In helping with this matter, you help decrease call volume, and therefore allow [place] to be  able to respond to patients needs more efficiently.   Acute office visits (sick visit):  An acute visit is intended for a new problem and are scheduled in shorter time slots to allow schedule openings for patients with new problems. This is the appropriate visit to discuss a new problem. Problems will not be addressed by phone call or Echart message. Appointment is needed if requesting treatment. In order to provide you with excellent quality medical care with proper time for you to explain your problem, have an exam and  receive treatment with instructions, these appointments should be limited to one new problem per visit. If you experience a new problem, in which you desire to be addressed, please make an acute office visit, we save openings on the schedule to accommodate you. Please do not save your new problem for any other type of visit, let us take care of it properly and quickly for you.   Follow up visits:  Depending on your condition(s) your provider will need to see you routinely in order to provide you with quality care and prescribe medication(s). Most chronic conditions (Example: hypertension, Diabetes, depression/anxiety... etc), require visits a couple times a year. Your provider will instruct you on proper follow up for your personal medical conditions and history. Please make certain to make follow up appointments for your condition as instructed. Failing to do so could result in lapse in your medication treatment/refills. If you request a refill, and are overdue to be seen on a condition, we will always provide you with a 30 day script (once) to allow you time to schedule.    Medicare wellness (well visit): - we have a wonderful Nurse Maudie Mercury), that will meet with you and provide you will yearly medicare wellness visits. These visits should occur yearly (can not be scheduled less than 1 calendar year apart) and cover preventive health, immunizations, advance directives and screenings you are entitled to yearly through your medicare benefits. Do not miss out on your entitled benefits, this is when medicare will pay for these benefits to be ordered for you.  These are strongly encouraged by your provider and is the appropriate type of visit to make certain you are up to date with all preventive health benefits. If you have not had your medicare wellness exam in the last 12 months, please make certain to schedule one by calling the office and schedule your medicare wellness with Maudie Mercury as soon as possible.   Yearly  physical (well visit):  - Adults are recommended to be seen yearly for physicals. Check with your insurance and date of your last physical, most insurances require one calendar year between physicals. Physicals include all preventive health topics, screenings, medical exam and labs that are appropriate for gender/age and history. You may have fasting labs needed at this visit. This is a well visit (not a sick visit), new problems should not be covered during this visit (see acute visit).  - Pediatric patients are seen more frequently when they are younger. Your provider will advise you on well child visit timing that is appropriate for your their age. - This is not a medicare wellness visit. Medicare wellness exams do not have an exam portion to the visit. Some medicare companies allow for a physical, some do not allow a yearly physical. If your medicare allows a yearly physical you can schedule the medicare wellness with our nurse Maudie Mercury and have your physical with your provider after, on the same day. Please check with insurance  for your full benefits.   Late Policy/No Shows:  - all new patients should arrive 15-30 minutes earlier than appointment to allow [place] time  to  obtain all personal demographics,  insurance information and for you to complete office paperwork. - All established patients should arrive 10-15 minutes earlier than appointment time to update all information and be checked in .  - In our best efforts to run on time, if you are late for your appointment you will be asked to either reschedule or if able, we will work you back into the schedule. There will be a wait time to work you back in the schedule,  depending on availability.  - If you are unable to make it to your appointment as scheduled, please call 24 hours ahead of time to allow [place] to fill the time slot with someone else who needs to be seen. If you do not cancel your appointment ahead of time, you may be charged a no show fee.

## 2017-12-18 ENCOUNTER — Ambulatory Visit: Payer: BLUE CROSS/BLUE SHIELD | Admitting: Surgery

## 2017-12-18 VITALS — BP 136/86 | HR 67 | Resp 20 | Ht 70.0 in | Wt 385.0 lb

## 2017-12-18 DIAGNOSIS — I712 Thoracic aortic aneurysm, without rupture, unspecified: Secondary | ICD-10-CM

## 2017-12-18 NOTE — Progress Notes (Signed)
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 Since I saw him in January he has been eating better and exercising and has lost some weight. He feels much better. He has not had any chest or back pain and no shortness of breath.  Current Outpatient Medications  Medication Sig Dispense Refill  . amLODipine (NORVASC) 10 MG tablet Take 1 tablet (10 mg total) by mouth daily. Needs office visit prior to any additional refills 90 tablet 1  . atorvastatin (LIPITOR) 40 MG tablet Take 1 tablet (40 mg total) by mouth daily. 90 tablet 1  . furosemide (LASIX) 20 MG tablet Take 2 tablets (40 mg total) by mouth daily. 90 tablet 0  . lisinopril (PRINIVIL,ZESTRIL) 40 MG tablet Take 1 tablet (40 mg total) by mouth daily. 90 tablet 1  . metoprolol succinate (TOPROL-XL) 50 MG 24 hr tablet Take 1 tablet (50 mg total) by mouth daily. Take with or immediately following a meal. 30 tablet 6  . spironolactone (ALDACTONE) 25 MG tablet Take 2 tablets (50 mg total) by mouth daily. Needs office visit prior to any additional refills. 180 tablet 1   No current facility-administered medications for this visit.      Physical Exam: BP 136/86   Pulse 67   Resp 20   Ht 5\' 10"  (1.778 m)   Wt (!) 385 lb (174.6 kg)   SpO2 98% Comment: RA  BMI 55.24 kg/m  He looks well Cardiac exam shows a regular rate and rhythm with normal heart sounds and no murmur. Lungs are clear  Diagnostic Tests:  CT ANGIO CHEST AORTA W &/OR WO CONTRAST (Accession [id number]) (Order [id number])  Imaging  Date: 09/10/2017 Department: [facility] Ordering/Authorizing: Georgeanna Lea, MD  Exam Information   Status Exam Begun  Exam Ended   Final [99] 09/17/2017 9:59 AM 09/17/2017 10:20 AM  PACS Images   Show images for CT ANGIO CHEST AORTA W &/OR WO CONTRAST  Study Result   CLINICAL DATA:  Follow-up of aneurysmal disease of the ascending thoracic aorta.  EXAM: CT  ANGIOGRAPHY CHEST WITH CONTRAST  TECHNIQUE: Multidetector CT imaging of the chest was performed using the standard protocol during bolus administration of intravenous contrast. Multiplanar CT image reconstructions and MIPs were obtained to evaluate the vascular anatomy.  CONTRAST:  ISOVUE-370 IOPAMIDOL (ISOVUE-370) INJECTION 76%  COMPARISON:  03/22/2017  FINDINGS: Cardiovascular: The aortic root measures 3.9 cm at the level of the sinuses of Valsalva. The ascending thoracic aorta measures 4 cm in greatest diameter, which is at the upper limits of normal in caliber. The aortic arch measures 3.3 cm. The descending thoracic aorta measures 2.6 cm. Previous maximal aortic measurements of 4.4 x 4.7 cm are felt to be overestimated and the prior study was also impaired by significant motion artifact. No evidence of aortic dissection. Proximal great vessels are normally patent.  The heart size is normal. No pericardial fluid identified. Again noted is mild dilatation of the main pulmonary artery without significant dilated right and left central pulmonary arteries.  Mediastinum/Nodes: No enlarged mediastinal, hilar, or axillary lymph nodes. Thyroid gland, trachea, and esophagus demonstrate no significant findings.  Lungs/Pleura: There is no evidence of pulmonary edema, consolidation, pneumothorax, nodule or pleural fluid.  Upper Abdomen: No acute abnormality.  Musculoskeletal: No chest wall abnormality. No acute or significant osseous findings.  Review of the MIP images confirms the above findings.  IMPRESSION: Previous aortic dimensions are felt to  be overestimated due to motion artifact. The ascending thoracic aorta actually measures 4 cm in greatest diameter, which is at the upper limits of normal.  Recommend annual imaging followup by CTA or MRA. This recommendation follows 2010 ACCF/AHA/AATS/ACR/ASA/SCA/SCAI/SIR/STS/SVM Guidelines for the Diagnosis and  Management of Patients with Thoracic Aortic Disease. Circulation. 2010; 121: [id number]   Electronically Signed   By: Irish Lack M.D.   On: 09/17/2017 10:43     Impression:  He has a stable 4.0 cm fusiform ascending aortic aneurysm. His CTA on 03/12/2017 had significant motion artifact and the measurement of 4.7 cm is felt to be overestimated due to that. His aneurysm is small and well below the 5.5 cm surgical threshold. I reviewed the images with him and answered his questions. I stressed the importance of good blood pressure control as well as continued dietary modification and exercise. He should have a follow-up scan in one year.  Plan:  He will return to see me in one year with a CTA of the chest.   I spent 15 minutes performing this established patient evaluation and > 50% of this time was spent face to face counseling and coordinating the care of this patient's aortic aneurysm.    Alleen Borne, MD [facility] [phone]

## 2017-12-22 ENCOUNTER — Encounter: Payer: Self-pay | Admitting: Surgery

## 2017-12-27 DIAGNOSIS — Z3189 Encounter for other procreative management: Secondary | ICD-10-CM | POA: Diagnosis not present

## 2018-05-05 ENCOUNTER — Other Ambulatory Visit: Payer: Self-pay | Admitting: Family Medicine

## 2018-05-05 DIAGNOSIS — R600 Localized edema: Secondary | ICD-10-CM

## 2018-05-05 NOTE — Telephone Encounter (Signed)
Pt needs to keep FU appt in 06/2018, or no rfs

## 2018-05-08 ENCOUNTER — Other Ambulatory Visit: Payer: Self-pay | Admitting: Family Medicine

## 2018-05-08 DIAGNOSIS — R600 Localized edema: Secondary | ICD-10-CM

## 2018-05-08 NOTE — Telephone Encounter (Signed)
Called and LM with Pharmacy stating pt could speak with MD at next appt about getting a 90 day supply with refills.

## 2018-05-09 ENCOUNTER — Other Ambulatory Visit: Payer: Self-pay | Admitting: Family Medicine

## 2018-05-09 DIAGNOSIS — R600 Localized edema: Secondary | ICD-10-CM

## 2018-05-09 MED ORDER — FUROSEMIDE 20 MG PO TABS: 40.0000 mg | ORAL_TABLET | Freq: Every day | ORAL | 0 refills | Status: DC

## 2018-05-09 NOTE — Addendum Note (Signed)
Addended by: Daryel November L on: 05/09/2018 03:23 PM   Modules accepted: Orders

## 2018-05-15 ENCOUNTER — Other Ambulatory Visit: Payer: Self-pay | Admitting: Cardiology

## 2018-06-03 DIAGNOSIS — Z3189 Encounter for other procreative management: Secondary | ICD-10-CM | POA: Diagnosis not present

## 2018-06-10 ENCOUNTER — Other Ambulatory Visit: Payer: Self-pay | Admitting: Cardiology

## 2018-06-10 ENCOUNTER — Telehealth: Payer: Self-pay

## 2018-06-10 NOTE — Telephone Encounter (Signed)
Rejected patient refill request to metoprolol. Was supposed to come back in Sept 2019 for follow up.

## 2018-06-12 IMAGING — CT CT ANGIO CHEST
2 of 6 series · 17 of 46 positions shown · IV contrast (APPLIED)
Comparison: Chest radiograph January 24, 2017

CLINICAL DATA: Shortness of breath and chest pain

EXAM:
CT ANGIOGRAPHY CHEST WITH CONTRAST
TECHNIQUE: Multidetector CT imaging of the chest was performed using the
standard protocol during bolus administration of intravenous
contrast. Multiplanar CT image reconstructions and MIPs were
obtained to evaluate the vascular anatomy.
CONTRAST:  100mL KYFUQT-XGP IOPAMIDOL (KYFUQT-XGP) INJECTION 76%

[Series 4: axial arterial · axial · arterial · 0.77mm/px · z∈[-321,-66]mm · 14 of 96 slices shown]
[im 6/96  lung]
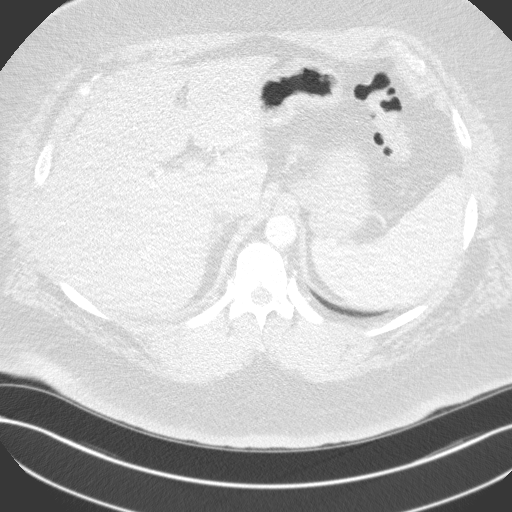
[im 11/96  soft-tissue]
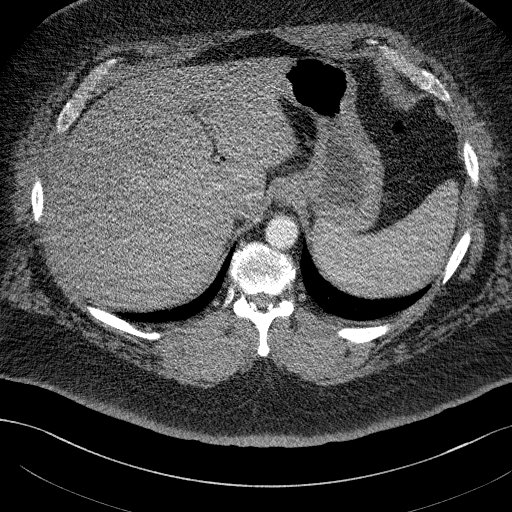
[im 21/96  lung]
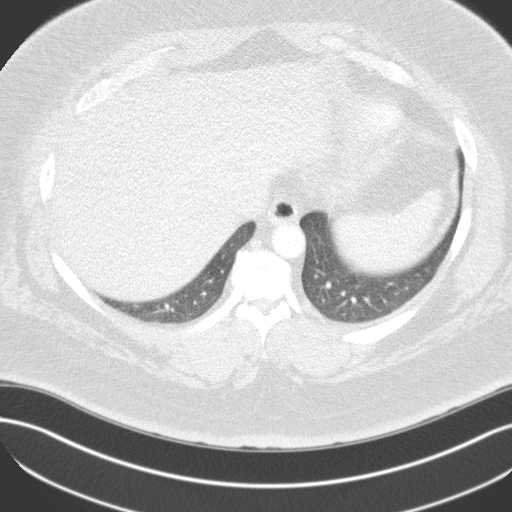
[im 26/96  soft-tissue]
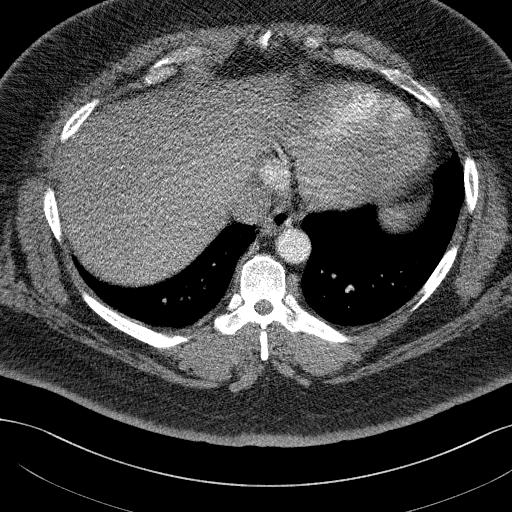
[im 31/96  lung]
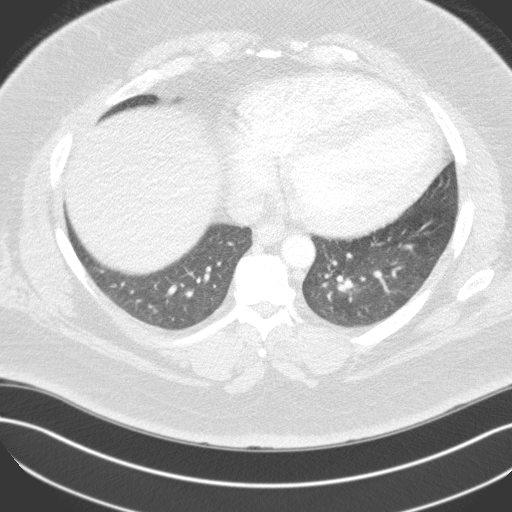
[im 41/96  soft-tissue]
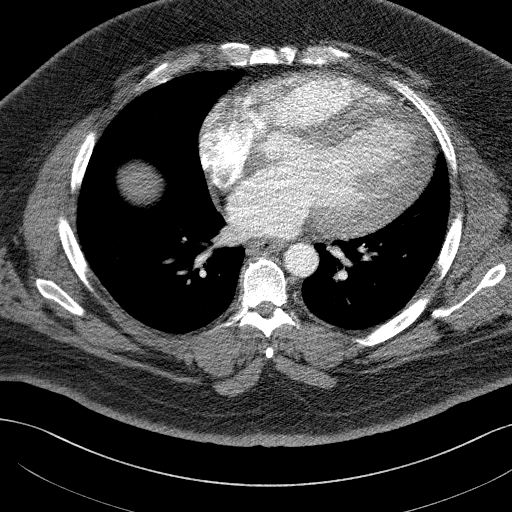
[im 46/96  lung]
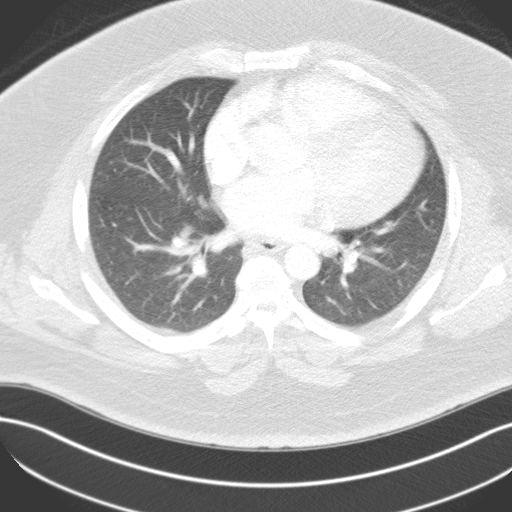
[im 51/96  soft-tissue]
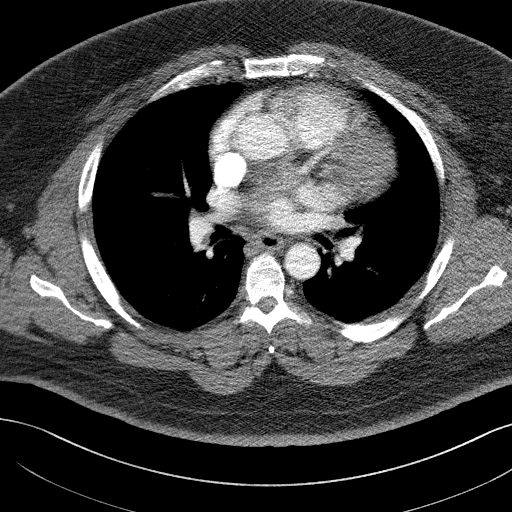
[im 56/96  lung]
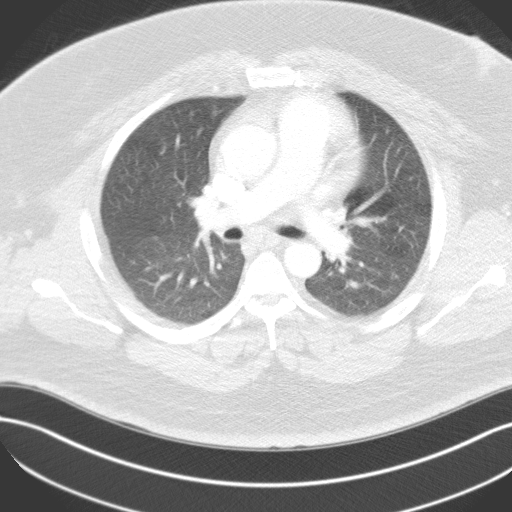
[im 66/96  soft-tissue]
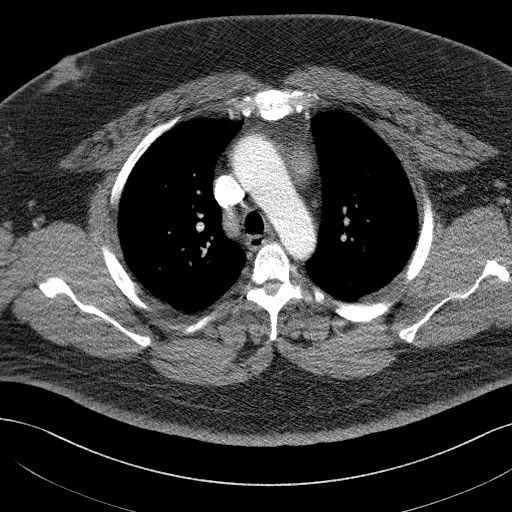
[im 71/96  lung]
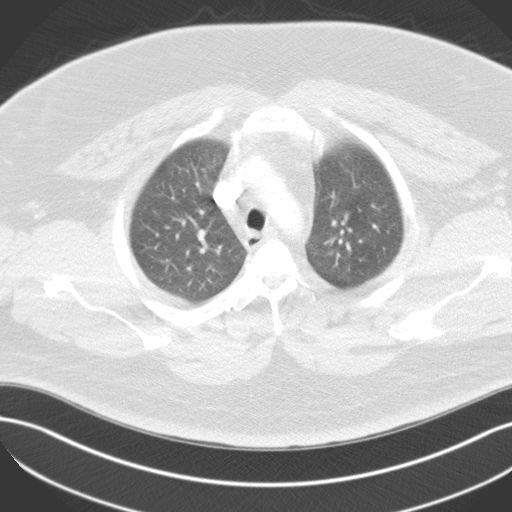
[im 76/96  soft-tissue]
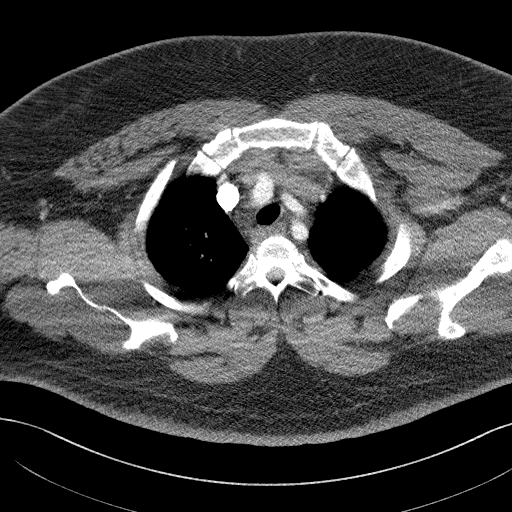
[im 86/96  lung]
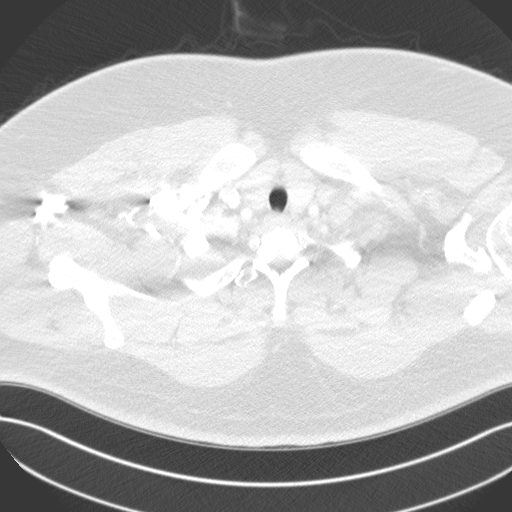
[im 91/96  soft-tissue]
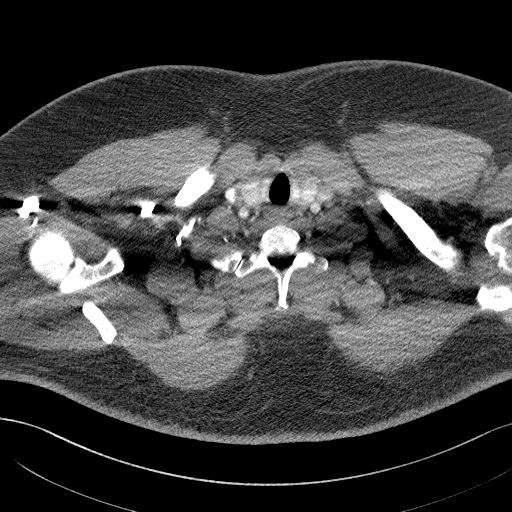

[Series 7: coronal · coronal · 0.58mm/px · 3 of 117 slices shown]
[im 30/117  soft-tissue]
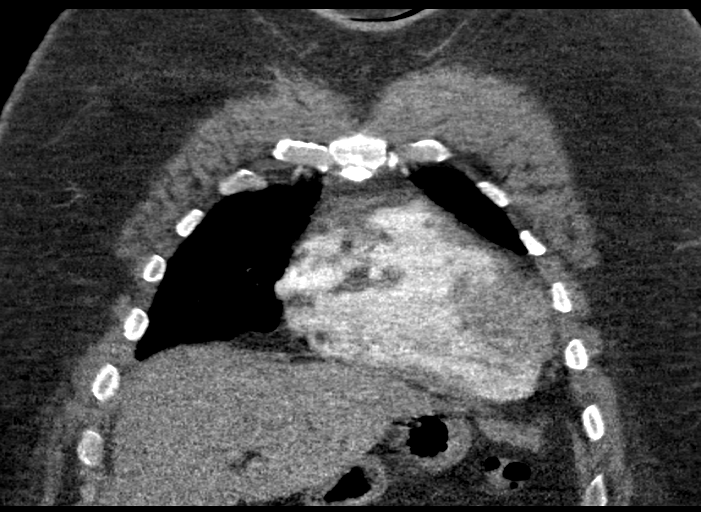
[im 59/117  soft-tissue]
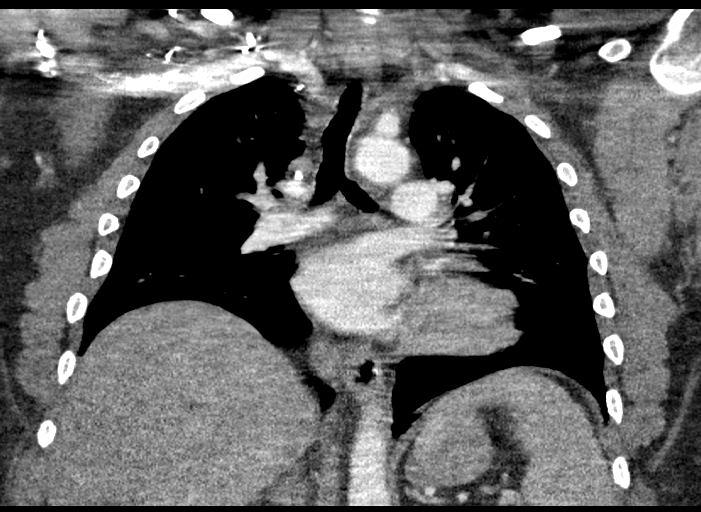
[im 88/117  soft-tissue]
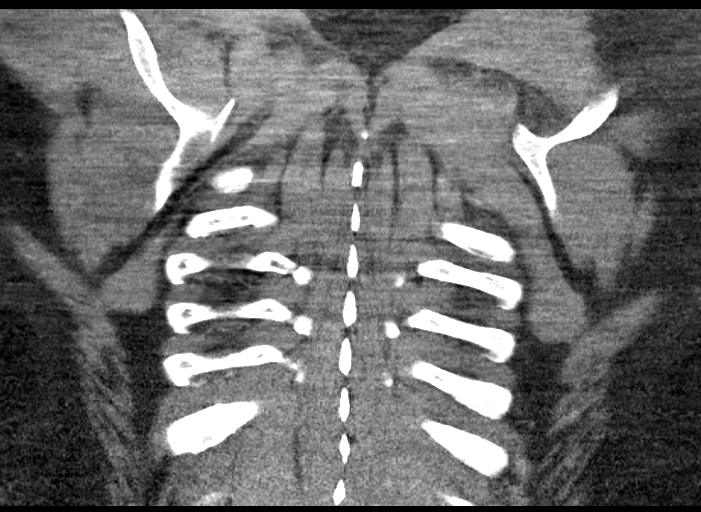

[17 of 46 positions shown; findings below may reference images not displayed]

FINDINGS: Cardiovascular: There is no appreciable pulmonary embolus. There is
prominence of the ascending thoracic aorta with a measured
transverse diameter of 4.7 x 4.4 cm. No dissection evident.
Visualized great vessels appear unremarkable. Pericardium is not
appreciably thickened. No pericardial effusion evident. Heart is
mildly enlarged in a generalized manner. There is prominence of the
main pulmonary outflow tract with a measured diameter of 3.9 cm.

Mediastinum/Nodes: Thyroid appears unremarkable. There is no
appreciable thoracic adenopathy. No esophageal lesions are
appreciable.

Lungs/Pleura: There is no parenchymal lung edema or consolidation.
No pleural effusion or pleural thickening evident.

Upper Abdomen: Visualized upper abdominal structures appear
unremarkable.

Musculoskeletal: There are no appreciable blastic or lytic bone
lesions.

Review of the MIP images confirms the above findings.
IMPRESSION: 1. Ascending thoracic aorta measures 4.7 x 4.4 cm. Ascending
thoracic aortic aneurysm. Recommend semi-annual imaging followup by
CTA or MRA and referral to cardiothoracic surgery if not already
obtained. This recommendation follows 4393
ACCF/AHA/AATS/ACR/ASA/SCA/PAJA/IRSHAD/MOATSHE/NII AKWEI Guidelines for the
Diagnosis and Management of Patients With Thoracic Aortic Disease.
Circulation. 4393; 121: [id number]. No demonstrable thoracic aortic
dissection.

2.  No demonstrable pulmonary embolus.

3. There is prominence of the main pulmonary outflow tract with a
measured transverse diameter of 3.9 cm. This finding is concerning
for underlying pulmonary arterial hypertension.

4.  No lung edema or consolidation.

5.  No evident thoracic adenopathy.

## 2018-06-18 ENCOUNTER — Ambulatory Visit: Payer: BLUE CROSS/BLUE SHIELD | Admitting: Family Medicine

## 2018-06-24 ENCOUNTER — Ambulatory Visit (INDEPENDENT_AMBULATORY_CARE_PROVIDER_SITE_OTHER): Payer: Self-pay | Admitting: Family Medicine

## 2018-06-24 ENCOUNTER — Other Ambulatory Visit: Payer: Self-pay

## 2018-06-24 ENCOUNTER — Encounter: Payer: Self-pay | Admitting: Family Medicine

## 2018-06-24 VITALS — BP 123/84 | HR 78 | Temp 97.9°F | Resp 16 | Ht 70.0 in | Wt 388.1 lb

## 2018-06-24 DIAGNOSIS — Z800 Family history of malignant neoplasm of digestive organs: Secondary | ICD-10-CM

## 2018-06-24 DIAGNOSIS — I10 Essential (primary) hypertension: Secondary | ICD-10-CM | POA: Diagnosis not present

## 2018-06-24 DIAGNOSIS — Z6841 Body Mass Index (BMI) 40.0 and over, adult: Secondary | ICD-10-CM | POA: Diagnosis not present

## 2018-06-24 DIAGNOSIS — I160 Hypertensive urgency: Secondary | ICD-10-CM

## 2018-06-24 DIAGNOSIS — I420 Dilated cardiomyopathy: Secondary | ICD-10-CM

## 2018-06-24 DIAGNOSIS — Z1211 Encounter for screening for malignant neoplasm of colon: Secondary | ICD-10-CM

## 2018-06-24 DIAGNOSIS — R7303 Prediabetes: Secondary | ICD-10-CM

## 2018-06-24 DIAGNOSIS — R600 Localized edema: Secondary | ICD-10-CM

## 2018-06-24 LAB — COMPREHENSIVE METABOLIC PANEL
ALT: 24 U/L (ref 0–53)
AST: 21 U/L (ref 0–37)
Albumin: 4.8 g/dL (ref 3.5–5.2)
Alkaline Phosphatase: 40 U/L (ref 39–117)
BUN: 18 mg/dL (ref 6–23)
CALCIUM: 9.9 mg/dL (ref 8.4–10.5)
CHLORIDE: 98 meq/L (ref 96–112)
CO2: 31 meq/L (ref 19–32)
CREATININE: 1.25 mg/dL (ref 0.40–1.50)
GFR: 63.47 mL/min (ref >60.00)
GLUCOSE: 137 mg/dL — AB (ref 70–99)
Potassium: 4.4 mEq/L (ref 3.5–5.1)
SODIUM: 138 meq/L (ref 135–145)
Total Bilirubin: 0.9 mg/dL (ref 0.2–1.2)
Total Protein: 7.7 g/dL (ref 6.0–8.3)

## 2018-06-24 LAB — LIPID PANEL
Cholesterol: 248 mg/dL — ABNORMAL HIGH (ref 0–200)
HDL: 45.4 mg/dL (ref >39.00)
LDL Cholesterol: 177 mg/dL — ABNORMAL HIGH (ref 0–99)
NONHDL: 202.89
Total CHOL/HDL Ratio: 5
Triglycerides: 131 mg/dL (ref 0.0–149.0)
VLDL: 26.2 mg/dL (ref 0.0–40.0)

## 2018-06-24 LAB — CBC
HEMATOCRIT: 45.5 % (ref 39.0–52.0)
Hemoglobin: 15.4 g/dL (ref 13.0–17.0)
MCHC: 33.9 g/dL (ref 30.0–36.0)
MCV: 84.3 fl (ref 78.0–100.0)
Platelets: 211 10*3/uL (ref 150.0–400.0)
RBC: 5.4 Mil/uL (ref 4.22–5.81)
RDW: 13.6 % (ref 11.5–15.5)
WBC: 7.9 10*3/uL (ref 4.0–10.5)

## 2018-06-24 LAB — TSH: TSH: 1.55 u[IU]/mL (ref 0.35–4.50)

## 2018-06-24 LAB — HEMOGLOBIN A1C: HEMOGLOBIN A1C: 6.8 % — AB (ref 4.6–6.5)

## 2018-06-24 MED ORDER — METOPROLOL SUCCINATE ER 50 MG PO TB24: 50.0000 mg | ORAL_TABLET | Freq: Every day | ORAL | 1 refills | Status: DC

## 2018-06-24 MED ORDER — AMLODIPINE BESYLATE 10 MG PO TABS: 10.0000 mg | ORAL_TABLET | Freq: Every day | ORAL | 1 refills | Status: DC

## 2018-06-24 MED ORDER — SPIRONOLACTONE 25 MG PO TABS: 50.0000 mg | ORAL_TABLET | Freq: Every day | ORAL | 1 refills | Status: DC

## 2018-06-24 MED ORDER — LISINOPRIL 40 MG PO TABS: 40.0000 mg | ORAL_TABLET | Freq: Every day | ORAL | 1 refills | Status: DC

## 2018-06-24 MED ORDER — FUROSEMIDE 20 MG PO TABS: 40.0000 mg | ORAL_TABLET | Freq: Every day | ORAL | 1 refills | Status: DC

## 2018-06-24 MED ORDER — NALTREXONE-BUPROPION HCL ER 8-90 MG PO TB12: ORAL_TABLET | ORAL | 2 refills | Status: DC

## 2018-06-24 NOTE — Progress Notes (Signed)
Patient ID: Darren Carlson, male  DOB: March 01, 1976, 43 y.o.   MRN: [id number] Patient Care Team    Relationship Specialty Notifications Start End  Natalia Leatherwood, DO PCP - General Family Medicine  01/23/17   Alleen Borne, MD Consulting Physician Cardiothoracic Surgery  05/03/17   Georgeanna Lea, MD Consulting Physician Cardiology  05/03/17     Chief Complaint  Patient presents with  . Follow-up    HTN, pt is fasting.     Subjective:  Darren Carlson is a 43 y.o.  male present for follow-up on Prediabetes/metabolic syndrome/obesity: Body mass index is 55.69 kg/m. Last a1c 5.8 12/16/2017. Never started metformin and had been bale to decrease a1c with diet and exercise.  He has been exercising routinely and strictly watching his diet. He is concerned he is no longer losing weight and gaining back some back.  Patient denies dizziness, hyperglycemic or hypoglycemic events. Patient denies numbness, tingling in the extremities or nonhealing wounds of feet.   hypertension/hyperlipidemia/acute combined systolic and diastolic heart failure/cardiomyopathy Pt reports compliance with Toprol-XL 50 mg daily, amlodipine 10 mg daily, Aldactone 50 mg daily, lisinopril 40 mg daily, Lasix 40 mg daily. Patient denies chest pain, shortness of breath, dizziness or lower extremity edema.  Pt takes a daily baby ASA. Pt is  prescribed statin--> stopped  2 months ago. Coreg recently discontinued and metoprolol XL started secondary to side effects. He has established with cardiology and CTS.  Thoracic aortic aneurysm 4.7 x 4.4 cm. --> on rpt image it is not felt he has an aneurysm. He is at the upper limits of normal. Original image felt to have artifact. BMP: 09/2017 CBC: 01/29/2017 within normal limits Lipid: 01/31/2017 total cholesterol 194, HDL 37, LDL 140, triglycerides 80 Microalbumin: 01/25/2017 43.3 (elevated)--> on ace Diet: Watching diet very closely, low-sodium Exercise: Routine exercise RF:  Hypertension, hyperlipidemia, obesity, dilated cardiomyopathy, thoracic aortic aneurysm.  CTA 09/17/2017: CLINICAL DATA:  Follow-up of aneurysmal disease of the ascending thoracic aorta. EXAM: CT ANGIOGRAPHY CHEST WITH CONTRAST CONTRAST:  ISOVUE-370 IOPAMIDOL (ISOVUE-370) INJECTION 76% COMPARISON:  03/22/2017 IMPRESSION: Previous aortic dimensions are felt to be overestimated due to motion artifact. The ascending thoracic aorta actually measures 4 cm in greatest diameter, which is at the upper limits of normal.  CT anterior chest 03/12/2017: IMPRESSION: 1. Ascending thoracic aorta measures 4.7 x 4.4 cm.  2.  No demonstrable pulmonary embolus. 3. There is prominence of the main pulmonary outflow tract with a measured transverse diameter of 3.9 cm. This finding is concerning for underlying pulmonary arterial hypertension. 4.  No lung edema or consolidation. 5.  No evident thoracic adenopathy.  Echocardiogram 04/26/2017: LV EF: 55-65 % Study Conclusions - Left ventricle: The cavity size was normal. There was moderate   concentric hypertrophy. Systolic function was normal. The   estimated ejection fraction was in the range of 55% to 65%. Wall   motion was normal; there were no regional wall motion   abnormalities. The study is not technically sufficient to allow   evaluation of LV diastolic function. - Aortic valve: Valve area (VTI): 3.44 cm^2. Valve area (Vmax):   3.05 cm^2. Valve area (Vmean): 2.95 cm^2. Impressions: - Normal LVEF.   Moderate LVH.   Trace MR.  Echocardiogram 01/29/2017: LV EF: 35% -   40% Study Conclusions - Left ventricle: The cavity size was normal. Wall thickness was   increased in a pattern of mild LVH. There was mild concentric   hypertrophy. Systolic function  was moderately reduced. The   estimated ejection fraction was in the range of 35% to 40%.   Diffuse hypokinesis. Features are consistent with a pseudonormal   left ventricular filling  pattern, with concomitant abnormal   relaxation and increased filling pressure (grade 2 diastolic   dysfunction). Doppler parameters are consistent with high   ventricular filling pressure. - Left atrium: The atrium was moderately to severely dilated. - Cardiac MRI is recommended.   Depression screen [facility] 2/9 12/16/2017 01/23/2017  Decreased Interest 0 0  Down, Depressed, Hopeless 0 0  PHQ - 2 Score 0 0    Past Medical History:  Diagnosis Date  . Asthma   . Environmental allergies   . GERD (gastroesophageal reflux disease)   . Obesity    Allergies  Allergen Reactions  . Amoxicillin Rash  . Penicillins Rash   Past Surgical History:  Procedure Laterality Date  . WISDOM TOOTH EXTRACTION     Family History  Problem Relation Age of Onset  . Colon cancer Maternal Grandmother 64       early death  . Diabetes Paternal Grandmother   . Kidney disease Paternal Grandmother   . Diabetes Paternal Grandfather   . Kidney disease Paternal Grandfather   . Colon cancer Maternal Aunt 11   Social History   Socioeconomic History  . Marital status: Married    Spouse name: Jaymes Graff  . Number of children: 2  . Years of education: 50  . Highest education level: Not on file  Occupational History  . Occupation: Sports administrator  Social Needs  . Financial resource strain: Not on file  . Food insecurity:    Worry: Not on file    Inability: Not on file  . Transportation needs:    Medical: Not on file    Non-medical: Not on file  Tobacco Use  . Smoking status: Never Smoker  . Smokeless tobacco: Never Used  Substance and Sexual Activity  . Alcohol use: Yes    Comment: occasional  . Drug use: No  . Sexual activity: Yes    Partners: Female    Birth control/protection: None  Lifestyle  . Physical activity:    Days per week: Not on file    Minutes per session: Not on file  . Stress: Not on file  Relationships  . Social connections:    Talks on phone: Not on file    Gets together:  Not on file    Attends religious service: Not on file    Active member of club or organization: Not on file    Attends meetings of clubs or organizations: Not on file    Relationship status: Not on file  . Intimate partner violence:    Fear of current or ex partner: Not on file    Emotionally abused: Not on file    Physically abused: Not on file    Forced sexual activity: Not on file  Other Topics Concern  . Not on file  Social History Narrative   Married to [place], 2 children.    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 Drinks caffeine.    Smoke alarm in the home. Wears seatbelt.    Feels safe in his relationships.    Allergies as of 06/24/2018      Reactions   Amoxicillin Rash   Penicillins Rash      Medication List       Accurate as of June 24, 2018  9:14 AM. Always  use your most recent med list.        amLODipine 10 MG tablet Commonly known as:  NORVASC Take 1 tablet (10 mg total) by mouth daily. Needs office visit prior to any additional refills   atorvastatin 40 MG tablet Commonly known as:  LIPITOR Take 1 tablet (40 mg total) by mouth daily.   furosemide 20 MG tablet Commonly known as:  LASIX Take 2 tablets (40 mg total) by mouth daily.   lisinopril 40 MG tablet Commonly known as:  PRINIVIL,ZESTRIL Take 1 tablet (40 mg total) by mouth daily.   metoprolol succinate 50 MG 24 hr tablet Commonly known as:  TOPROL-XL TAKE 1 TABLET (50 MG TOTAL) BY MOUTH DAILY. TAKE WITH OR IMMEDIATELY FOLLOWING A MEAL.   spironolactone 25 MG tablet Commonly known as:  ALDACTONE Take 2 tablets (50 mg total) by mouth daily. Needs office visit prior to any additional refills.       All past medical history, surgical history, allergies, family history, immunizations andmedications were updated in the EMR today and reviewed under the history and medication portions of their EMR.    No results found for this or any previous visit (from the  past 2160 hour(s)).  Patient was never admitted.   ROS: 14 pt review of systems performed and negative (unless mentioned in an HPI)  Objective: BP 123/84 (BP Location: Right Arm, Patient Position: Sitting, Cuff Size: Large)   Pulse 78   Temp 97.9 F (36.6 C) (Oral)   Resp 16   Ht  (1.778 m)   Wt (!) 388 lb 2 oz (176.1 kg)   SpO2 100%   BMI 55.69 kg/m   Gen: Afebrile. No acute distress. Nontoxic. Pleasant caucasian male. Obese.  HENT: AT. [place].  MMM.  Eyes:Pupils Equal Round Reactive to light, Extraocular movements intact,  Conjunctiva without redness, discharge or icterus. Neck/lymp/endocrine: Supple,no lymphadenopathy, no thyromegaly CV: RRR no murmur, no edema, +2/4 P posterior tibialis pulses Chest: CTAB, no wheeze or crackles Abd: Soft. obese. NTND. BS present.  Skin: no rashes, purpura or petechiae.  Neuro:  Normal gait. PERLA. EOMi. Alert. Oriented x3  Psych: Normal affect, dress and demeanor. Normal speech. Normal thought content and judgment.   Assessment/plan: Darren Carlson is a 43 y.o. male present for establishment visit with  Essential hypertension/dyspnea on exertion/morbid obesity Acute combined systolic and diastolic heart failure (HCC) - Stable. Refills provided today. - Ejection fraction improved, return to normal. LVH, trace MR. - Thoracic aortic aneurysm --> repeat imaging felt he did not have an  aneurysm and original was artifact.  - Continue amlodipine 10 mg daily, continue lisinopril 40 mg daily, continue Lasix 40 mg daily, continue metoprolol XL 50 mg daily.Continue Aldactone 50 mg daily.   - Continue daily baby aspirin. - Continue atorvastatin 40 mg daily. - Continue follow-up per routine schedule with cardiology and cardiothoracic surgeon. - Follow-up 6 months  Prediabetes/metabolic syndrome/morbid obesity: - A1c 6.4--> 6.3 --> 5.8--> ordered today.  Great job.  - was on metformin briefly--> GI Sx--> he stopped.   - Continue to diet and  exercise. increase his cardio. Decrease starch carbs vegetable sources only). - discussed weight loss medication with him again today.  - Contrave prescribed- pt aware this may not be covered by insurance.  - F/U 3 months after starting Contrave if desiring refills.   Note is dictated utilizing voice recognition software. Although note has been proof read prior to signing, occasional typographical errors still can be missed. If  any questions arise, please do not hesitate to call for verification.  Electronically signed by: Howard Pouch, DO [place]

## 2018-06-24 NOTE — Patient Instructions (Addendum)
It was great to see you today.  I have refilled your meds.  I will call you with lab once resulted.  Follow up in 6 months on chronic medical conditions.    Please help [place] help you:  We are honored you have chosen [facility] for your Primary Care home. Below you will find basic instructions that you may need to access in the future. Please help [place] help you by reading the instructions, which cover many of the frequent questions we experience.   Prescription refills and request:  -In order to allow more efficient response time, please call your pharmacy for all refills. They will forward the request electronically to [place]. This allows for the quickest possible response. Request left on a nurse line can take longer to refill, since these are checked as time allows between office patients and other phone calls.  - refill request can take up to 3-5 working days to complete.  - If request is sent electronically and request is appropiate, it is usually completed in 1-2 business days.  - all patients will need to be seen routinely for all chronic medical conditions requiring prescription medications (see follow-up below). If you are overdue for follow up on your condition, you will be asked to make an appointment and we will call in enough medication to cover you until your appointment (up to 30 days).  - all controlled substances will require a face to face visit to request/refill.  - if you desire your prescriptions to go through a new pharmacy, and have an active script at original pharmacy, you will need to call your pharmacy and have scripts transferred to new pharmacy. This is completed between the pharmacy locations and not by your provider.    Results: If any images or labs were ordered, it can take up to 1 week to get results depending on the test ordered and the lab/facility running and resulting the test. - Normal or stable results, which do not need further discussion, may be released to  your mychart immediately with attached note to you. A call may not be generated for normal results. Please make certain to sign up for mychart. If you have questions on how to activate your mychart you can call the front office.  - If your results need further discussion, our office will attempt to contact you via phone, and if unable to reach you after 2 attempts, we will release your abnormal result to your mychart with instructions.  - All results will be automatically released in mychart after 1 week.  - Your provider will provide you with explanation and instruction on all relevant material in your results. Please keep in mind, results and labs may appear confusing or abnormal to the untrained eye, but it does not mean they are actually abnormal for you personally. If you have any questions about your results that are not covered, or you desire more detailed explanation than what was provided, you should make an appointment with your provider to do so.   Our office handles many outgoing and incoming calls daily. If we have not contacted you within 1 week about your results, please check your mychart to see if there is a message first and if not, then contact our office.  In helping with this matter, you help decrease call volume, and therefore allow [place] to be able to respond to patients needs more efficiently.   Acute office visits (sick visit):  An acute visit is intended for  a new problem and are scheduled in shorter time slots to allow schedule openings for patients with new problems. This is the appropriate visit to discuss a new problem. Problems will not be addressed by phone call or Echart message. Appointment is needed if requesting treatment. In order to provide you with excellent quality medical care with proper time for you to explain your problem, have an exam and receive treatment with instructions, these appointments should be limited to one new problem per visit. If you experience a new  problem, in which you desire to be addressed, please make an acute office visit, we save openings on the schedule to accommodate you. Please do not save your new problem for any other type of visit, let us take care of it properly and quickly for you.   Follow up visits:  Depending on your condition(s) your provider will need to see you routinely in order to provide you with quality care and prescribe medication(s). Most chronic conditions (Example: hypertension, Diabetes, depression/anxiety... etc), require visits a couple times a year. Your provider will instruct you on proper follow up for your personal medical conditions and history. Please make certain to make follow up appointments for your condition as instructed. Failing to do so could result in lapse in your medication treatment/refills. If you request a refill, and are overdue to be seen on a condition, we will always provide you with a 30 day script (once) to allow you time to schedule.    Medicare wellness (well visit): - we have a wonderful Nurse Maudie Mercury), that will meet with you and provide you will yearly medicare wellness visits. These visits should occur yearly (can not be scheduled less than 1 calendar year apart) and cover preventive health, immunizations, advance directives and screenings you are entitled to yearly through your medicare benefits. Do not miss out on your entitled benefits, this is when medicare will pay for these benefits to be ordered for you.  These are strongly encouraged by your provider and is the appropriate type of visit to make certain you are up to date with all preventive health benefits. If you have not had your medicare wellness exam in the last 12 months, please make certain to schedule one by calling the office and schedule your medicare wellness with Maudie Mercury as soon as possible.   Yearly physical (well visit):  - Adults are recommended to be seen yearly for physicals. Check with your insurance and date of your  last physical, most insurances require one calendar year between physicals. Physicals include all preventive health topics, screenings, medical exam and labs that are appropriate for gender/age and history. You may have fasting labs needed at this visit. This is a well visit (not a sick visit), new problems should not be covered during this visit (see acute visit).  - Pediatric patients are seen more frequently when they are younger. Your provider will advise you on well child visit timing that is appropriate for your their age. - This is not a medicare wellness visit. Medicare wellness exams do not have an exam portion to the visit. Some medicare companies allow for a physical, some do not allow a yearly physical. If your medicare allows a yearly physical you can schedule the medicare wellness with our nurse Maudie Mercury and have your physical with your provider after, on the same day. Please check with insurance for your full benefits.   Late Policy/No Shows:  - all new patients should arrive 15-30 minutes earlier than appointment to  allow [place] time  to  obtain all personal demographics,  insurance information and for you to complete office paperwork. - All established patients should arrive 10-15 minutes earlier than appointment time to update all information and be checked in .  - In our best efforts to run on time, if you are late for your appointment you will be asked to either reschedule or if able, we will work you back into the schedule. There will be a wait time to work you back in the schedule,  depending on availability.  - If you are unable to make it to your appointment as scheduled, please call 24 hours ahead of time to allow [place] to fill the time slot with someone else who needs to be seen. If you do not cancel your appointment ahead of time, you may be charged a no show fee.

## 2018-06-25 ENCOUNTER — Telehealth: Payer: Self-pay

## 2018-06-25 ENCOUNTER — Telehealth: Payer: Self-pay | Admitting: Family Medicine

## 2018-06-25 ENCOUNTER — Encounter: Payer: Self-pay | Admitting: Family Medicine

## 2018-06-25 DIAGNOSIS — E119 Type 2 diabetes mellitus without complications: Secondary | ICD-10-CM

## 2018-06-25 DIAGNOSIS — E785 Hyperlipidemia, unspecified: Secondary | ICD-10-CM

## 2018-06-25 DIAGNOSIS — E1169 Type 2 diabetes mellitus with other specified complication: Secondary | ICD-10-CM | POA: Insufficient documentation

## 2018-06-25 HISTORY — DX: Hyperlipidemia, unspecified: E78.5

## 2018-06-25 MED ORDER — ATORVASTATIN CALCIUM 40 MG PO TABS: 40.0000 mg | ORAL_TABLET | Freq: Every day | ORAL | 3 refills | Status: DC

## 2018-06-25 NOTE — Telephone Encounter (Signed)
Pts Contrave prior Berkley Harvey was denied. Pt contacted via my chart letting him know about denial and that he could call insurance company for additional options

## 2018-06-25 NOTE — Telephone Encounter (Signed)
Please inform patient the following information: His labs are normal with the following exceptions-   - his cholesterol is too high. I sympathize with him not desiring to be on so many mediations, but he does need to restart his statin total cholesterol 248 (< 200 goal), LDL 177 (100 goal).  - His a1c is also elevated, now in the diabetic range at 6.8 (normal 5.8) with a fast glucose 137. We do need to start a diabetes medicine. He did not tolerate the metformin when we tried last time. His insulin resistance is also likely what is making it difficult for him to lose weight. The silver lining is there is a medicine that can be taken for diabetes that also is prescribed for weight lose management. He would have to be willing to give himself an injection though. It is a very small needle and even needle shy patients have tolerated it well. If he is not willing to perform injections- we still need to start an oral diabetes medication and followup in 3 months were we will recheck cholesterol and diabetes response to meds and discuss glucose monitoring.    Please advise me of his decision on injectable vs oral, schedule his 3 mos f/u and ask him if he is interested in a diabetic/nutrition referral.    I know it is all frustrating since he has been dieting and exercising-- but some of this is genetics and out of his control.   In the meantime between appointments I encourage him to look into the mediterranean type diet (google- lots of information  And books) this is good for diabetics.

## 2018-06-25 NOTE — Telephone Encounter (Signed)
My chart message sent to patient  With results and recommendations

## 2018-06-26 MED ORDER — METFORMIN HCL 500 MG PO TABS: 500.0000 mg | ORAL_TABLET | Freq: Two times a day (BID) | ORAL | 1 refills | Status: DC

## 2018-06-26 NOTE — Telephone Encounter (Signed)
Opened in error

## 2018-06-26 NOTE — Telephone Encounter (Signed)
Pts response:  I will restart statin.   On the a1c....  I never actually took metformin. We were focused on BP / heart & the complaints I heard from other made me think twice. But BP is controlled so thats not a worry anymore. I would like to try metformin & a tightening of my diet /excercise. I lowered my a1c alot last year with a disciplined apporach. As I looked at my eating last 2 months... The junk sweets, fries and about 1 gym visit a week is not where i need to be. ( when I started writing it all down and tracking what I had done, those were big fails) Although I have alot wins, No fast food. No sugar drinks or diet. Very low sodium diet. But the bads have been creeping back into my life. I propose the next 3 months to try metformin & get refocused & see where we are at. I show no symptoms of a diabetic yet & I feel like I can do this. If in 3 months we still have a high a1c then I will try injections.   Were you getting me the colonoscopy info & the weight loss pill you mentioned?  Thanks  [employer]

## 2018-06-26 NOTE — Addendum Note (Signed)
Addended by: Felix Pacini A on: 06/26/2018 12:07 PM   Modules accepted: Orders

## 2018-06-26 NOTE — Telephone Encounter (Signed)
Metformin BID prescribed.  The colonoscopy (REFERRAL PLACED AT APPT) and weight loss med have already been discussed on another thread.

## 2018-06-26 NOTE — Telephone Encounter (Signed)
Pt sent My chart message that referral was placed and about metformin being started

## 2018-09-26 DIAGNOSIS — L918 Other hypertrophic disorders of the skin: Secondary | ICD-10-CM | POA: Diagnosis not present

## 2018-09-26 DIAGNOSIS — L853 Xerosis cutis: Secondary | ICD-10-CM | POA: Diagnosis not present

## 2018-09-26 DIAGNOSIS — B351 Tinea unguium: Secondary | ICD-10-CM | POA: Diagnosis not present

## 2018-11-05 ENCOUNTER — Other Ambulatory Visit: Payer: Self-pay

## 2018-11-05 ENCOUNTER — Ambulatory Visit (INDEPENDENT_AMBULATORY_CARE_PROVIDER_SITE_OTHER): Payer: BC Managed Care – PPO | Admitting: Gastroenterology

## 2018-11-05 ENCOUNTER — Encounter: Payer: Self-pay | Admitting: Gastroenterology

## 2018-11-05 VITALS — Ht 70.0 in | Wt 370.0 lb

## 2018-11-05 DIAGNOSIS — Z800 Family history of malignant neoplasm of digestive organs: Secondary | ICD-10-CM | POA: Diagnosis not present

## 2018-11-05 NOTE — Progress Notes (Signed)
This patient contacted our office requesting a physician telemedicine consultation regarding clinical questions and/or test results. Due to COVID restrictions, this was felt to be the most appropriate method of patient evaluation. If new patient, they were referred by Howard Pouch, DO  Participants on the conference : myself and patient   The patient consented to this consultation and was aware that a charge will be placed through their insurance.  They were also made aware of the limitations of telemedicine.  I was in my office and the patient was at home.   Encounter time:  Total time 30 minutes, with 20 minutes spent with patient on Dox[employer]ity   _____________________________________________________________________________________________              [facility] Consult Note:  History: Darren Carlson 11/05/2018  Referring provider: Ma Hillock, DO  Reason for consult/chief complaint: family history of colon cancer (Discuss a colonoscopy, due to family history )   Subjective  HPI: He had concerns about family history of colon cancer, and that he might need to start colonoscopy early.  He denies chronic abdominal pain, difficulty with bowel habits, or rectal bleeding.  He denies frequent heartburn, dysphagia, odynophagia, nausea vomiting or weight loss. Family history of colon cancer includes maternal grandmother relatively young in her early 3s, and a maternal aunt, who reportedly had multiple malignancies, including colon cancer. Darren Carlson's mother has been regularly screened and reports she has not had colon cancer.   ROS:  Review of Systems He denies chest pain dyspnea or dysuria  Past Medical History: Past Medical History:  Diagnosis Date  . Asthma   . Environmental allergies   . GERD (gastroesophageal reflux disease)   . Hemorrhoids   . Obesity      Past Surgical History: Past Surgical History:  Procedure Laterality Date  . WISDOM TOOTH  EXTRACTION       Family History: Family History  Problem Relation Age of Onset  . Colon cancer Maternal Grandmother 15       early death  . Diabetes Paternal Grandmother   . Kidney disease Paternal Grandmother   . Diabetes Paternal Grandfather   . Kidney disease Paternal Grandfather   . Colon cancer Maternal Aunt 7  . Colon cancer Cousin   . Stomach cancer Neg Hx   . Pancreatic cancer Neg Hx   . Esophageal cancer Neg Hx     Social History: Social History   Socioeconomic History  . Marital status: Married    Spouse name: [name]  . Number of children: 2  . Years of education: 22  . Highest education level: Not on file  Occupational History  . Occupation: Regulatory affairs officer  Social Needs  . Financial resource strain: Not on file  . Food insecurity    Worry: Not on file    Inability: Not on file  . Transportation needs    Medical: Not on file    Non-medical: Not on file  Tobacco Use  . Smoking status: Never Smoker  . Smokeless tobacco: Never Used  Substance and Sexual Activity  . Alcohol use: Yes    Comment: occasional  . Drug use: No  . Sexual activity: Yes    Partners: Female    Birth control/protection: None  Lifestyle  . Physical activity    Days per week: Not on file    Minutes per session: Not on file  . Stress: Not on file  Relationships  . Social connections    Talks on phone: Not on  file    Gets together: Not on file    Attends religious service: Not on file    Active member of club or organization: Not on file    Attends meetings of clubs or organizations: Not on file    Relationship status: Not on file  Other Topics Concern  . Not on file  Social History Narrative   Married to [name], 2 children.    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 Drinks caffeine.    Smoke alarm in the home. Wears seatbelt.    Feels safe in his relationships.     Allergies: Allergies  Allergen Reactions  . Amoxicillin Rash  .  Penicillins Rash    Outpatient Meds: Current Outpatient Medications  Medication Sig Dispense Refill  . amLODipine (NORVASC) 10 MG tablet Take 1 tablet (10 mg total) by mouth daily. Needs office visit prior to any additional refills 90 tablet 1  . atorvastatin (LIPITOR) 40 MG tablet Take 1 tablet (40 mg total) by mouth daily. 90 tablet 3  . furosemide (LASIX) 20 MG tablet Take 2 tablets (40 mg total) by mouth daily. 180 tablet 1  . lisinopril (PRINIVIL,ZESTRIL) 40 MG tablet Take 1 tablet (40 mg total) by mouth daily. 90 tablet 1  . metoprolol succinate (TOPROL-XL) 50 MG 24 hr tablet Take 1 tablet (50 mg total) by mouth daily. Take with or immediately following a meal. 90 tablet 1  . spironolactone (ALDACTONE) 25 MG tablet Take 2 tablets (50 mg total) by mouth daily. Needs office visit prior to any additional refills. 180 tablet 1  . terbinafine (LAMISIL) 250 MG tablet     . metFORMIN (GLUCOPHAGE) 500 MG tablet Take 1 tablet (500 mg total) by mouth 2 (two) times daily with a meal. (Patient not taking: Reported on 11/05/2018) 180 tablet 1   No current facility-administered medications for this visit.       ___________________________________________________________________ Objective   Exam:  No exam-virtual visit  Labs:  CBC Latest Ref Rng & Units 06/24/2018 01/29/2017 01/23/2017  WBC 4.0 - 10.5 K/uL 7.9 9.5 11.3(H)  Hemoglobin 13.0 - 17.0 g/dL 32.915.4 51.814.1 12.4(L)  Hematocrit 39.0 - 52.0 % 45.5 43.2 37.0(L)  Platelets 150.0 - 400.0 K/uL 211.0 245.0 199   CMP Latest Ref Rng & Units 06/24/2018 09/13/2017 05/29/2017  Glucose 70 - 99 mg/dL 841(Y137(H) 606(T159(H) 016(W115(H)  BUN 6 - 23 mg/dL 18 16 14   Creatinine 0.40 - 1.50 mg/dL 1.091.25 3.231.24 5.571.25  Sodium 135 - 145 mEq/L 138 140 140  Potassium 3.5 - 5.1 mEq/L 4.4 4.7 4.5  Chloride 96 - 112 mEq/L 98 101 100  CO2 19 - 32 mEq/L 31 26 25   Calcium 8.4 - 10.5 mg/dL 9.9 9.0 9.5  Total Protein 6.0 - 8.3 g/dL 7.7 - -  Total Bilirubin 0.2 - 1.2 mg/dL 0.9 - -   Alkaline Phos 39 - 117 U/L 40 - -  AST 0 - 37 U/L 21 - -  ALT 0 - 53 U/L 24 - -     Assessment: Encounter Diagnosis  Name Primary?  . Family history of colon cancer Yes    Two second-degree relatives with reported colorectal cancer.  Based on current studies and guidelines, Gregary SignsSean is considered to be at average risk, and should have us for screening colonoscopy at age 43.  Plan:  See me sooner than that if needed for change in bowel habits, abdominal pain rectal bleeding, or other GI concerns.  Thank you  for the courtesy of this consult.  Please call me with any questions or concerns.  Nelida Meuse III  CC: Referring provider noted above

## 2018-11-05 NOTE — Patient Instructions (Addendum)
If you are age 42 or older, your body mass index should be between 23-30. Your Body mass index is 53.09 kg/m. If this is out of the aforementioned range listed, please consider follow up with your Primary Care Provider.  If you are age 71 or younger, your body mass index should be between 19-25. Your Body mass index is 53.09 kg/m. If this is out of the aformentioned range listed, please consider follow up with your Primary Care Provider.   See me sooner than that if needed for change in bowel habits, abdominal pain rectal bleeding, or other GI concerns.  You should have a screening colonoscopy at age 25.  It was a pleasure to see you today!  Dr. Loletha Carrow

## 2018-11-13 ENCOUNTER — Other Ambulatory Visit: Payer: Self-pay | Admitting: Surgery

## 2018-11-13 DIAGNOSIS — I712 Thoracic aortic aneurysm, without rupture, unspecified: Secondary | ICD-10-CM

## 2018-12-12 DIAGNOSIS — D485 Neoplasm of uncertain behavior of skin: Secondary | ICD-10-CM | POA: Diagnosis not present

## 2018-12-12 DIAGNOSIS — L918 Other hypertrophic disorders of the skin: Secondary | ICD-10-CM | POA: Diagnosis not present

## 2018-12-12 DIAGNOSIS — B351 Tinea unguium: Secondary | ICD-10-CM | POA: Diagnosis not present

## 2018-12-25 ENCOUNTER — Other Ambulatory Visit: Payer: Self-pay

## 2018-12-25 ENCOUNTER — Ambulatory Visit: Payer: BC Managed Care – PPO | Admitting: Family Medicine

## 2018-12-25 ENCOUNTER — Encounter: Payer: Self-pay | Admitting: Family Medicine

## 2018-12-25 VITALS — BP 113/72 | HR 67 | Temp 97.1°F | Resp 18 | Ht 70.0 in | Wt 382.1 lb

## 2018-12-25 DIAGNOSIS — Z6841 Body Mass Index (BMI) 40.0 and over, adult: Secondary | ICD-10-CM

## 2018-12-25 DIAGNOSIS — E785 Hyperlipidemia, unspecified: Secondary | ICD-10-CM

## 2018-12-25 DIAGNOSIS — E119 Type 2 diabetes mellitus without complications: Secondary | ICD-10-CM | POA: Diagnosis not present

## 2018-12-25 DIAGNOSIS — Z79899 Other long term (current) drug therapy: Secondary | ICD-10-CM

## 2018-12-25 DIAGNOSIS — I10 Essential (primary) hypertension: Secondary | ICD-10-CM

## 2018-12-25 DIAGNOSIS — R600 Localized edema: Secondary | ICD-10-CM

## 2018-12-25 LAB — COMPREHENSIVE METABOLIC PANEL
ALT: 20 U/L (ref 0–53)
AST: 19 U/L (ref 0–37)
Albumin: 4.7 g/dL (ref 3.5–5.2)
Alkaline Phosphatase: 43 U/L (ref 39–117)
BUN: 13 mg/dL (ref 6–23)
CO2: 29 mEq/L (ref 19–32)
Calcium: 9.9 mg/dL (ref 8.4–10.5)
Chloride: 101 mEq/L (ref 96–112)
Creatinine, Ser: 1.2 mg/dL (ref 0.40–1.50)
GFR: 66.37 mL/min (ref >60.00)
Glucose, Bld: 127 mg/dL — ABNORMAL HIGH (ref 70–99)
Potassium: 4.3 mEq/L (ref 3.5–5.1)
Sodium: 138 mEq/L (ref 135–145)
Total Bilirubin: 1.3 mg/dL — ABNORMAL HIGH (ref 0.2–1.2)
Total Protein: 7.2 g/dL (ref 6.0–8.3)

## 2018-12-25 LAB — POCT GLYCOSYLATED HEMOGLOBIN (HGB A1C)
HbA1c POC (<> result, manual entry): 6.3 % (ref 4.0–5.6)
HbA1c, POC (controlled diabetic range): 6.3 % (ref 0.0–7.0)
HbA1c, POC (prediabetic range): 6.3 % (ref 5.7–6.4)
Hemoglobin A1C: 6.3 % — AB (ref 4.0–5.6)

## 2018-12-25 LAB — CBC
HCT: 43.2 % (ref 39.0–52.0)
Hemoglobin: 14.3 g/dL (ref 13.0–17.0)
MCHC: 33.1 g/dL (ref 30.0–36.0)
MCV: 85.2 fl (ref 78.0–100.0)
Platelets: 185 10*3/uL (ref 150.0–400.0)
RBC: 5.07 Mil/uL (ref 4.22–5.81)
RDW: 13.3 % (ref 11.5–15.5)
WBC: 6.7 10*3/uL (ref 4.0–10.5)

## 2018-12-25 LAB — LIPID PANEL
Cholesterol: 139 mg/dL (ref 0–200)
HDL: 41.3 mg/dL (ref >39.00)
LDL Cholesterol: 78 mg/dL (ref 0–99)
NonHDL: 97.81
Total CHOL/HDL Ratio: 3
Triglycerides: 101 mg/dL (ref 0.0–149.0)
VLDL: 20.2 mg/dL (ref 0.0–40.0)

## 2018-12-25 MED ORDER — FUROSEMIDE 20 MG PO TABS: 40.0000 mg | ORAL_TABLET | Freq: Every day | ORAL | 1 refills | Status: DC

## 2018-12-25 MED ORDER — LISINOPRIL 40 MG PO TABS: 40.0000 mg | ORAL_TABLET | Freq: Every day | ORAL | 1 refills | Status: DC

## 2018-12-25 MED ORDER — AMLODIPINE BESYLATE 10 MG PO TABS: 10.0000 mg | ORAL_TABLET | Freq: Every day | ORAL | 1 refills | Status: DC

## 2018-12-25 MED ORDER — SPIRONOLACTONE 25 MG PO TABS: 50.0000 mg | ORAL_TABLET | Freq: Every day | ORAL | 1 refills | Status: DC

## 2018-12-25 MED ORDER — METFORMIN HCL 500 MG PO TABS: 500.0000 mg | ORAL_TABLET | Freq: Two times a day (BID) | ORAL | 1 refills | Status: DC

## 2018-12-25 MED ORDER — METOPROLOL SUCCINATE ER 50 MG PO TB24: 50.0000 mg | ORAL_TABLET | Freq: Every day | ORAL | 1 refills | Status: DC

## 2018-12-25 NOTE — Progress Notes (Signed)
Patient ID: Darren Carlson, male  DOB: June 05, 1975, 43 y.o.   MRN: [id number] Patient Care Team    Relationship Specialty Notifications Start End  Ma Hillock, DO PCP - General Family Medicine  01/23/17   Gaye Pollack, MD Consulting Physician Cardiothoracic Surgery  05/03/17   Park Liter, MD Consulting Physician Cardiology  05/03/17     Chief Complaint  Patient presents with   Diabetes    Pt is doing well. No complaints. Pt has not taken metformin.    Hypertension    Subjective:  Darren Carlson is a 43 y.o.  male present for follow-up on diabetes/metabolic syndrome/obesity: Body mass index is 54.83 kg/m.  Last A1c 6.8 06/24/2018 with a fasting glucose of 137.  Patient discontinued the metformin.  And has been working on his diet and exercise alone.  He does not desire to be on medication. He has been exercising routinely and strictly watching his diet. He is concerned he is no longer losing weight and gaining back some back.  Patient denies dizziness, hyperglycemic or hypoglycemic events. Patient denies numbness, tingling in the extremities or nonhealing wounds of feet.   hypertension/hyperlipidemia/acute combined systolic and diastolic heart failure/cardiomyopathy Pt reports compliance with Toprol-XL 50 mg daily, amlodipine 10 mg daily, Aldactone 50 mg daily, lisinopril 40 mg daily, Lasix 40 mg daily. Patient denies chest pain, shortness of breath, dizziness or lower extremity edema.  Pt takes a daily baby ASA.  Coreg discontinued and metoprolol XL started secondary to side effects. He has established with cardiology and CTS.  Thoracic aortic aneurysm 4.7 x 4.4 cm. --> on rpt image it is not felt he has an aneurysm. He is at the upper limits of normal. Original image felt to have artifact. BMP: Collected today CBC: Collected today Lipid: Collected today Microalbumin: 01/25/2017 43.3 (elevated)--> on ace Diet: Watching diet very closely, low-sodium Exercise: Routine  exercise RF: Hypertension, hyperlipidemia, obesity, dilated cardiomyopathy, thoracic aortic aneurysm.  CTA 09/17/2017: CLINICAL DATA:  Follow-up of aneurysmal disease of the ascending thoracic aorta. EXAM: CT ANGIOGRAPHY CHEST WITH CONTRAST CONTRAST:  176m ISOVUE-370 IOPAMIDOL (ISOVUE-370) INJECTION 76% COMPARISON:  03/22/2017 IMPRESSION: Previous aortic dimensions are felt to be overestimated due to motion artifact. The ascending thoracic aorta actually measures 4 cm in greatest diameter, which is at the upper limits of normal.  CT anterior chest 03/12/2017: IMPRESSION: 1. Ascending thoracic aorta measures 4.7 x 4.4 cm.  2.  No demonstrable pulmonary embolus. 3. There is prominence of the main pulmonary outflow tract with a measured transverse diameter of 3.9 cm. This finding is concerning for underlying pulmonary arterial hypertension. 4.  No lung edema or consolidation. 5.  No evident thoracic adenopathy.  Echocardiogram 04/26/2017: LV EF: 55-65 % Study Conclusions - Left ventricle: The cavity size was normal. There was moderate   concentric hypertrophy. Systolic function was normal. The   estimated ejection fraction was in the range of 55% to 65%. Wall   motion was normal; there were no regional wall motion   abnormalities. The study is not technically sufficient to allow   evaluation of LV diastolic function. - Aortic valve: Valve area (VTI): 3.44 cm^2. Valve area (Vmax):   3.05 cm^2. Valve area (Vmean): 2.95 cm^2. Impressions: - Normal LVEF.   Moderate LVH.   Trace MR.  Echocardiogram 01/29/2017: LV EF: 35% -   40% Study Conclusions - Left ventricle: The cavity size was normal. Wall thickness was   increased in a pattern of mild LVH.  There was mild concentric   hypertrophy. Systolic function was moderately reduced. The   estimated ejection fraction was in the range of 35% to 40%.   Diffuse hypokinesis. Features are consistent with a pseudonormal   left  ventricular filling pattern, with concomitant abnormal   relaxation and increased filling pressure (grade 2 diastolic   dysfunction). Doppler parameters are consistent with high   ventricular filling pressure. - Left atrium: The atrium was moderately to severely dilated. - Cardiac MRI is recommended.   Depression screen [facility] 2/9 06/24/2018 12/16/2017 01/23/2017  Decreased Interest 0 0 0  Down, Depressed, Hopeless 0 0 0  PHQ - 2 Score 0 0 0  Altered sleeping 0 - -  Tired, decreased energy 0 - -  Change in appetite 1 - -  Feeling bad or failure about yourself  0 - -  Trouble concentrating 0 - -  Moving slowly or fidgety/restless 0 - -  Suicidal thoughts 0 - -  PHQ-9 Score 1 - -  Difficult doing work/chores Not difficult at all - -    Past Medical History:  Diagnosis Date   Asthma    Environmental allergies    GERD (gastroesophageal reflux disease)    Hemorrhoids    Obesity    Allergies  Allergen Reactions   Amoxicillin Rash   Penicillins Rash   Past Surgical History:  Procedure Laterality Date   WISDOM TOOTH EXTRACTION     Family History  Problem Relation Age of Onset   Colon cancer Maternal Grandmother 33       early death   Diabetes Paternal Grandmother    Kidney disease Paternal Grandmother    Diabetes Paternal Grandfather    Kidney disease Paternal Grandfather    Colon cancer Maternal Aunt 35   Colon cancer Cousin    Stomach cancer Neg Hx    Pancreatic cancer Neg Hx    Esophageal cancer Neg Hx    Social History   Socioeconomic History   Marital status: Married    Spouse name: Terese   Number of children: 2   Years of education: 16   Highest education level: Not on file  Occupational History   Occupation: Insurance account manager strain: Not on file   Food insecurity    Worry: Not on file    Inability: Not on file   Transportation needs    Medical: Not on file    Non-medical: Not on file  Tobacco  Use   Smoking status: Never Smoker   Smokeless tobacco: Never Used  Substance and Sexual Activity   Alcohol use: Yes    Comment: occasional   Drug use: No   Sexual activity: Yes    Partners: Female    Birth control/protection: None  Lifestyle   Physical activity    Days per week: Not on file    Minutes per session: Not on file   Stress: Not on file  Relationships   Social connections    Talks on phone: Not on file    Gets together: Not on file    Attends religious service: Not on file    Active member of club or organization: Not on file    Attends meetings of clubs or organizations: Not on file    Relationship status: Not on file   Intimate partner violence    Fear of current or ex partner: Not on file    Emotionally abused: Not on file    Physically abused:  Not on file    Forced sexual activity: Not on file  Other Topics Concern   Not on file  Social History Narrative   Married to Wisner, 2 children.    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 Drinks caffeine.    Smoke alarm in the home. Wears seatbelt.    Feels safe in his relationships.    Allergies as of 12/25/2018      Reactions   Amoxicillin Rash   Penicillins Rash      Medication List       Accurate as of December 25, 2018 11:59 PM. If you have any questions, ask your nurse or doctor.        amLODipine 10 MG tablet Commonly known as: NORVASC Take 1 tablet (10 mg total) by mouth daily. Needs office visit prior to any additional refills   ammonium lactate 12 % lotion Commonly known as: LAC-HYDRIN APPLY TO ARMS AND HANDS TWICE DAILY AS NEEDED. CAN BE USED ON FACE, NECK, CHEST, AND LEGS IF DESIRED   aspirin EC 81 MG tablet Take 81 mg by mouth daily.   atorvastatin 40 MG tablet Commonly known as: LIPITOR Take 1 tablet (40 mg total) by mouth daily.   furosemide 20 MG tablet Commonly known as: LASIX Take 2 tablets (40 mg total) by mouth daily.     lisinopril 40 MG tablet Commonly known as: ZESTRIL Take 1 tablet (40 mg total) by mouth daily.   metFORMIN 500 MG tablet Commonly known as: GLUCOPHAGE Take 1 tablet (500 mg total) by mouth 2 (two) times daily with a meal.   metoprolol succinate 50 MG 24 hr tablet Commonly known as: TOPROL-XL Take 1 tablet (50 mg total) by mouth daily. Take with or immediately following a meal.   spironolactone 25 MG tablet Commonly known as: ALDACTONE Take 2 tablets (50 mg total) by mouth daily. Needs office visit prior to any additional refills.   terbinafine 250 MG tablet Commonly known as: LAMISIL       All past medical history, surgical history, allergies, family history, immunizations andmedications were updated in the EMR today and reviewed under the history and medication portions of their EMR.    Recent Results (from the past 2160 hour(s))  POCT glycosylated hemoglobin (Hb A1C)     Status: Abnormal   Collection Time: 12/25/18  9:20 AM  Result Value Ref Range   Hemoglobin A1C 6.3 (A) 4.0 - 5.6 %   HbA1c POC (<> result, manual entry) 6.3 4.0 - 5.6 %   HbA1c, POC (prediabetic range) 6.3 5.7 - 6.4 %   HbA1c, POC (controlled diabetic range) 6.3 0.0 - 7.0 %  Lipid panel     Status: None   Collection Time: 12/25/18  9:49 AM  Result Value Ref Range   Cholesterol 139 0 - 200 mg/dL    Comment: ATP III Classification       Desirable:  < 200 mg/dL               Borderline High:  200 - 239 mg/dL          High:  > = 240 mg/dL   Triglycerides 101.0 0.0 - 149.0 mg/dL    Comment: Normal:  <150 mg/dLBorderline High:  150 - 199 mg/dL   HDL 41.30 >39.00 mg/dL   VLDL 20.2 0.0 - 40.0 mg/dL   LDL Cholesterol 78 0 - 99 mg/dL   Total CHOL/HDL Ratio 3     Comment:  Men          Women1/2 Average Risk     3.4          3.3Average Risk          5.0          4.42X Average Risk          9.6          7.13X Average Risk          15.0          11.0                       NonHDL 97.81     Comment:  NOTE:  Non-HDL goal should be 30 mg/dL higher than patient's LDL goal (i.e. LDL goal of < 70 mg/dL, would have non-HDL goal of < 100 mg/dL)  Comp Met (CMET)     Status: Abnormal   Collection Time: 12/25/18  9:49 AM  Result Value Ref Range   Sodium 138 135 - 145 mEq/L   Potassium 4.3 3.5 - 5.1 mEq/L   Chloride 101 96 - 112 mEq/L   CO2 29 19 - 32 mEq/L   Glucose, Bld 127 (H) 70 - 99 mg/dL   BUN 13 6 - 23 mg/dL   Creatinine, Ser 1.20 0.40 - 1.50 mg/dL   Total Bilirubin 1.3 (H) 0.2 - 1.2 mg/dL   Alkaline Phosphatase 43 39 - 117 U/L   AST 19 0 - 37 U/L   ALT 20 0 - 53 U/L   Total Protein 7.2 6.0 - 8.3 g/dL   Albumin 4.7 3.5 - 5.2 g/dL   Calcium 9.9 8.4 - 10.5 mg/dL   GFR 66.37 >60.00 mL/min  CBC     Status: None   Collection Time: 12/25/18  9:49 AM  Result Value Ref Range   WBC 6.7 4.0 - 10.5 K/uL   RBC 5.07 4.22 - 5.81 Mil/uL   Platelets 185.0 150.0 - 400.0 K/uL   Hemoglobin 14.3 13.0 - 17.0 g/dL   HCT 43.2 39.0 - 52.0 %   MCV 85.2 78.0 - 100.0 fl   MCHC 33.1 30.0 - 36.0 g/dL   RDW 13.3 11.5 - 15.5 %    Patient was never admitted.   ROS: 14 pt review of systems performed and negative (unless mentioned in an HPI)  Objective: BP 113/72 (BP Location: Right Arm, Patient Position: Sitting, Cuff Size: Large)    Pulse 67    Temp (!) 97.1 F (36.2 C) (Temporal)    Resp 18    Ht 5' 10" (1.778 m)    Wt (!) 382 lb 2 oz (173.3 kg)    SpO2 99%    BMI 54.83 kg/m   Gen: Afebrile. No acute distress. Morbidly obese male.  HENT: AT. [place].  Eyes:Pupils Equal Round Reactive to light, Extraocular movements intact,  Conjunctiva without redness, discharge or icterus. CV: RRR no murmur, no edema, +2/4 P posterior tibialis pulses Chest: CTAB, no wheeze or crackles Abd: Soft.obese. NTND. BS present. no Masses palpated.  Skin: no rashes, purpura or petechiae.  Neuro:  Normal gait. Alert. Oriented x3  Psych: Normal affect, dress and demeanor. Normal speech. Normal thought content and  judgment.  Assessment/plan: Darren Carlson is a 43 y.o. male present for establishment visit with  Essential hypertension/dyspnea on exertion/morbid obesity Acute combined systolic and diastolic heart failure ([place]) - stable. Refills provided today on all meds.  - Ejection fraction improved, return to  normal. LVH, trace MR. - Thoracic aortic aneurysm --> repeat imaging felt he did not have an  aneurysm and original was artifact.  - Continue amlodipine 10 mg daily, continue lisinopril 40 mg daily, continue Lasix 40 mg daily, continue metoprolol XL 50 mg daily.Continue Aldactone 50 mg daily.   - Continue daily baby aspirin. - Continue atorvastatin 40 mg daily. - Continue follow-up per routine schedule with cardiology and cardiothoracic surgeon. - cmp and lipids collected today - Follow-up 4 months  diabetes/metabolic syndrome/morbid obesity: - A1c 6.4--> 6.3 --> 5.8-->6.8>6.3  today.  Strongly encouraged him to start metformin just as important as the dietary changes he is doing.   - Continue to diet and exercise. increase his cardio. Decrease starch carbs- he also has become gluten free and lactose.  - discussed weight loss medication with him again last visit >>Contrave prescribed- last OV>> did take too expensive.  -Patient encouraged to schedule his eye exam, he is a patient of Dr. Mellody Memos. -He declined his pneumonia vaccines and his flu vaccine. -Foot exam completed 12/25/2018 - f/u 4 months   Note is dictated utilizing voice recognition software. Although note has been proof read prior to signing, occasional typographical errors still can be missed. If any questions arise, please do not hesitate to call for verification.   Electronically signed by: Howard Pouch, DO [place]

## 2018-12-25 NOTE — Patient Instructions (Addendum)
Nice to see you today.  Please take the metformin for your diabetes. It is absolutely great you are getting back in the habit of exercise and diet changes, but the metformin is also just as important to your health.   Your BP looked great.   Please get your eye exam completed.  Look up glycemic index- foods lower on the glycemic index are best.    Mediterranean Diet A Mediterranean diet refers to food and lifestyle choices that are based on the traditions of countries located on the [employer]. This way of eating has been shown to help prevent certain conditions and improve outcomes for people who have chronic diseases, like kidney disease and heart disease. What are tips for following this plan? Lifestyle  Cook and eat meals together with your family, when possible.  Drink enough fluid to keep your urine clear or pale yellow.  Be physically active every day. This includes: ? Aerobic exercise like running or swimming. ? Leisure activities like gardening, walking, or housework.  Get 7-8 hours of sleep each night.  If recommended by your health care provider, drink red wine in moderation. This means 1 glass a day for nonpregnant women and 2 glasses a day for men. A glass of wine equals 5 oz (150 mL). Reading food labels   Check the serving size of packaged foods. For foods such as rice and pasta, the serving size refers to the amount of cooked product, not dry.  Check the total fat in packaged foods. Avoid foods that have saturated fat or trans fats.  Check the ingredients list for added sugars, such as corn syrup. Shopping  At the grocery store, buy most of your food from the areas near the walls of the store. This includes: ? Fresh fruits and vegetables (produce). ? Grains, beans, nuts, and seeds. Some of these may be available in unpackaged forms or large amounts (in bulk). ? Fresh seafood. ? Poultry and eggs. ? Low-fat dairy products.  Buy whole ingredients instead  of prepackaged foods.  Buy fresh fruits and vegetables in-season from local farmers markets.  Buy frozen fruits and vegetables in resealable bags.  If you do not have access to quality fresh seafood, buy precooked frozen shrimp or canned fish, such as tuna, salmon, or sardines.  Buy small amounts of raw or cooked vegetables, salads, or olives from the deli or salad bar at your store.  Stock your pantry so you always have certain foods on hand, such as olive oil, canned tuna, canned tomatoes, rice, pasta, and beans. Cooking  Cook foods with extra-virgin olive oil instead of using butter or other vegetable oils.  Have meat as a side dish, and have vegetables or grains as your main dish. This means having meat in small portions or adding small amounts of meat to foods like pasta or stew.  Use beans or vegetables instead of meat in common dishes like chili or lasagna.  Experiment with different cooking methods. Try roasting or broiling vegetables instead of steaming or sauteing them.  Add frozen vegetables to soups, stews, pasta, or rice.  Add nuts or seeds for added healthy fat at each meal. You can add these to yogurt, salads, or vegetable dishes.  Marinate fish or vegetables using olive oil, lemon juice, garlic, and fresh herbs. Meal planning   Plan to eat 1 vegetarian meal one day each week. Try to work up to 2 vegetarian meals, if possible.  Eat seafood 2 or more times a week.  Have healthy snacks readily available, such as: ? Vegetable sticks with hummus. ? [place] yogurt. ? Fruit and nut trail mix.  Eat balanced meals throughout the week. This includes: ? Fruit: 2-3 servings a day ? Vegetables: 4-5 servings a day ? Low-fat dairy: 2 servings a day ? Fish, poultry, or lean meat: 1 serving a day ? Beans and legumes: 2 or more servings a week ? Nuts and seeds: 1-2 servings a day ? Whole grains: 6-8 servings a day ? Extra-virgin olive oil: 3-4 servings a day  Limit red  meat and sweets to only a few servings a month What are my food choices?  Mediterranean diet ? Recommended  Grains: Whole-grain pasta. Brown rice. Bulgar wheat. Polenta. Couscous. Whole-wheat bread. Orpah Cobb.  Vegetables: Artichokes. Beets. Broccoli. Cabbage. Carrots. Eggplant. Green beans. Chard. Kale. Spinach. Onions. Leeks. Peas. Squash. Tomatoes. Peppers. Radishes.  Fruits: Apples. Apricots. Avocado. Berries. Bananas. Cherries. Dates. Figs. Grapes. Lemons. Melon. Oranges. Peaches. Plums. Pomegranate.  Meats and other protein foods: Beans. Almonds. Sunflower seeds. Pine nuts. Peanuts. Cod. Salmon. Scallops. Shrimp. Tuna. Tilapia. Clams. Oysters. Eggs.  Dairy: Low-fat milk. Cheese. Greek yogurt.  Beverages: Water. Red wine. Herbal tea.  Fats and oils: Extra virgin olive oil. Avocado oil. Grape seed oil.  Sweets and desserts: [place] yogurt with honey. Baked apples. Poached pears. Trail mix.  Seasoning and other foods: Basil. Cilantro. Coriander. Cumin. Mint. Parsley. Sage. Rosemary. Tarragon. Garlic. Oregano. Thyme. Pepper. Balsalmic vinegar. Tahini. Hummus. Tomato sauce. Olives. Mushrooms. ? Limit these  Grains: Prepackaged pasta or rice dishes. Prepackaged cereal with added sugar.  Vegetables: Deep fried potatoes (french fries).  Fruits: Fruit canned in syrup.  Meats and other protein foods: Beef. Pork. Lamb. Poultry with skin. Hot dogs. Tomasa Blase.  Dairy: Ice cream. Sour cream. Whole milk.  Beverages: Juice. Sugar-sweetened soft drinks. Beer. Liquor and spirits.  Fats and oils: Butter. Canola oil. Vegetable oil. Beef fat (tallow). Lard.  Sweets and desserts: Cookies. Cakes. Pies. Candy.  Seasoning and other foods: Mayonnaise. Premade sauces and marinades. The items listed may not be a complete list. Talk with your dietitian about what dietary choices are right for you. Summary  The Mediterranean diet includes both food and lifestyle choices.  Eat a variety of  fresh fruits and vegetables, beans, nuts, seeds, and whole grains.  Limit the amount of red meat and sweets that you eat.  Talk with your health care provider about whether it is safe for you to drink red wine in moderation. This means 1 glass a day for nonpregnant women and 2 glasses a day for men. A glass of wine equals 5 oz (150 mL). This information is not intended to replace advice given to you by your health care provider. Make sure you discuss any questions you have with your health care provider. Document Released: 11/17/2015 Document Revised: 11/24/2015 Document Reviewed: 11/17/2015 Elsevier Patient Education  2020 [employer].   Please help [place] help you:  We are honored you have chosen [facility] for your Primary Care home. Below you will find basic instructions that you may need to access in the future. Please help [place] help you by reading the instructions, which cover many of the frequent questions we experience.   Prescription refills and request:  -In order to allow more efficient response time, please call your pharmacy for all refills. They will forward the request electronically to [place]. This allows for the quickest possible response. Request left on a nurse line can take longer to  refill, since these are checked as time allows between office patients and other phone calls.  - refill request can take up to 3-5 working days to complete.  - If request is sent electronically and request is appropiate, it is usually completed in 1-2 business days.  - all patients will need to be seen routinely for all chronic medical conditions requiring prescription medications (see follow-up below). If you are overdue for follow up on your condition, you will be asked to make an appointment and we will call in enough medication to cover you until your appointment (up to 30 days).  - all controlled substances will require a face to face visit to request/refill.  - if you desire your prescriptions  to go through a new pharmacy, and have an active script at original pharmacy, you will need to call your pharmacy and have scripts transferred to new pharmacy. This is completed between the pharmacy locations and not by your provider.    Results: If any images or labs were ordered, it can take up to 1 week to get results depending on the test ordered and the lab/facility running and resulting the test. - Normal or stable results, which do not need further discussion, may be released to your mychart immediately with attached note to you. A call may not be generated for normal results. Please make certain to sign up for mychart. If you have questions on how to activate your mychart you can call the front office.  - If your results need further discussion, our office will attempt to contact you via phone, and if unable to reach you after 2 attempts, we will release your abnormal result to your mychart with instructions.  - All results will be automatically released in mychart after 1 week.  - Your provider will provide you with explanation and instruction on all relevant material in your results. Please keep in mind, results and labs may appear confusing or abnormal to the untrained eye, but it does not mean they are actually abnormal for you personally. If you have any questions about your results that are not covered, or you desire more detailed explanation than what was provided, you should make an appointment with your provider to do so.   Our office handles many outgoing and incoming calls daily. If we have not contacted you within 1 week about your results, please check your mychart to see if there is a message first and if not, then contact our office.  In helping with this matter, you help decrease call volume, and therefore allow [place] to be able to respond to patients needs more efficiently.   Acute office visits (sick visit):  An acute visit is intended for a new problem and are scheduled in shorter  time slots to allow schedule openings for patients with new problems. This is the appropriate visit to discuss a new problem. Problems will not be addressed by phone call or Echart message. Appointment is needed if requesting treatment. In order to provide you with excellent quality medical care with proper time for you to explain your problem, have an exam and receive treatment with instructions, these appointments should be limited to one new problem per visit. If you experience a new problem, in which you desire to be addressed, please make an acute office visit, we save openings on the schedule to accommodate you. Please do not save your new problem for any other type of visit, let us take care of it properly and quickly  for you.   Follow up visits:  Depending on your condition(s) your provider will need to see you routinely in order to provide you with quality care and prescribe medication(s). Most chronic conditions (Example: hypertension, Diabetes, depression/anxiety... etc), require visits a couple times a year. Your provider will instruct you on proper follow up for your personal medical conditions and history. Please make certain to make follow up appointments for your condition as instructed. Failing to do so could result in lapse in your medication treatment/refills. If you request a refill, and are overdue to be seen on a condition, we will always provide you with a 30 day script (once) to allow you time to schedule.    Medicare wellness (well visit): - we have a wonderful Nurse Selena Batten(Kim), that will meet with you and provide you will yearly medicare wellness visits. These visits should occur yearly (can not be scheduled less than 1 calendar year apart) and cover preventive health, immunizations, advance directives and screenings you are entitled to yearly through your medicare benefits. Do not miss out on your entitled benefits, this is when medicare will pay for these benefits to be ordered for you.   These are strongly encouraged by your provider and is the appropriate type of visit to make certain you are up to date with all preventive health benefits. If you have not had your medicare wellness exam in the last 12 months, please make certain to schedule one by calling the office and schedule your medicare wellness with Selena BattenKim as soon as possible.   Yearly physical (well visit):  - Adults are recommended to be seen yearly for physicals. Check with your insurance and date of your last physical, most insurances require one calendar year between physicals. Physicals include all preventive health topics, screenings, medical exam and labs that are appropriate for gender/age and history. You may have fasting labs needed at this visit. This is a well visit (not a sick visit), new problems should not be covered during this visit (see acute visit).  - Pediatric patients are seen more frequently when they are younger. Your provider will advise you on well child visit timing that is appropriate for your their age. - This is not a medicare wellness visit. Medicare wellness exams do not have an exam portion to the visit. Some medicare companies allow for a physical, some do not allow a yearly physical. If your medicare allows a yearly physical you can schedule the medicare wellness with our nurse Selena BattenKim and have your physical with your provider after, on the same day. Please check with insurance for your full benefits.   Late Policy/No Shows:  - all new patients should arrive 15-30 minutes earlier than appointment to allow us time  to  obtain all personal demographics,  insurance information and for you to complete office paperwork. - All established patients should arrive 10-15 minutes earlier than appointment time to update all information and be checked in .  - In our best efforts to run on time, if you are late for your appointment you will be asked to either reschedule or if able, we will work you back into the  schedule. There will be a wait time to work you back in the schedule,  depending on availability.  - If you are unable to make it to your appointment as scheduled, please call 24 hours ahead of time to allow us to fill the time slot with someone else who needs to be seen. If you do not  cancel your appointment ahead of time, you may be charged a no show fee.

## 2018-12-29 ENCOUNTER — Encounter: Payer: Self-pay | Admitting: Family Medicine

## 2018-12-31 ENCOUNTER — Ambulatory Visit
Admission: RE | Admit: 2018-12-31 | Discharge: 2018-12-31 | Disposition: A | Discharge status: Home / Self Care | Payer: BC Managed Care – PPO | Source: Ambulatory Visit | Attending: Surgery | Admitting: Surgery

## 2018-12-31 ENCOUNTER — Other Ambulatory Visit: Payer: Self-pay

## 2018-12-31 ENCOUNTER — Ambulatory Visit: Payer: BC Managed Care – PPO | Admitting: Surgery

## 2018-12-31 ENCOUNTER — Encounter: Payer: Self-pay | Admitting: Surgery

## 2018-12-31 VITALS — BP 115/76 | HR 58 | Temp 97.9°F | Resp 18 | Ht 70.0 in | Wt 381.0 lb

## 2018-12-31 DIAGNOSIS — I712 Thoracic aortic aneurysm, without rupture, unspecified: Secondary | ICD-10-CM

## 2018-12-31 MED ORDER — IOPAMIDOL (ISOVUE-370) INJECTION 76%
75.0000 mL | Freq: Once | INTRAVENOUS | Status: AC | PRN
  Administered 2018-12-31: 75 mL via INTRAVENOUS

## 2018-12-31 NOTE — Progress Notes (Signed)
HPI:  The patient returns for follow up of an ascending aortic aneurysm. This was measured at 4.7 cm on CTA in 03/2017. A follow up CTA in 09/2017 gave a measure of 4.0 cm.  He has been feeling well overall. He has continued to modify his diet, has been exercising and losing weight slowly. He does describe some left sided chest pressure/pain that is not exertional.  Current Outpatient Medications  Medication Sig Dispense Refill  . amLODipine (NORVASC) 10 MG tablet Take 1 tablet (10 mg total) by mouth daily. Needs office visit prior to any additional refills 90 tablet 1  . aspirin EC 81 MG tablet Take 81 mg by mouth daily.    Marland Kitchen atorvastatin (LIPITOR) 40 MG tablet Take 1 tablet (40 mg total) by mouth daily. 90 tablet 3  . furosemide (LASIX) 20 MG tablet Take 2 tablets (40 mg total) by mouth daily. 180 tablet 1  . lisinopril (ZESTRIL) 40 MG tablet Take 1 tablet (40 mg total) by mouth daily. 90 tablet 1  . metoprolol succinate (TOPROL-XL) 50 MG 24 hr tablet Take 1 tablet (50 mg total) by mouth daily. Take with or immediately following a meal. 90 tablet 1  . spironolactone (ALDACTONE) 25 MG tablet Take 2 tablets (50 mg total) by mouth daily. Needs office visit prior to any additional refills. 180 tablet 1  . terbinafine (LAMISIL) 250 MG tablet      No current facility-administered medications for this visit.      Physical Exam: BP 115/76 (BP Location: Right Arm, Patient Position: Sitting, Cuff Size: Large)   Pulse (!) 58   Temp 97.9 F (36.6 C)   Resp 18   Ht 5\' 10"  (1.778 m)   Wt (!) 381 lb (172.8 kg)   SpO2 98% Comment: RA  BMI 54.67 kg/m  He looks well Cardiac exam shows a regular rate and rhythm with normal heart sounds and no murmur. Lungs are clear  Diagnostic Tests:  CLINICAL DATA:  Thoracic aortic aneurysm  EXAM: CT ANGIOGRAPHY CHEST WITH CONTRAST  TECHNIQUE: Multidetector CT imaging of the chest was performed using the standard protocol during bolus  administration of intravenous contrast. Multiplanar CT image reconstructions and MIPs were obtained to evaluate the vascular anatomy.  CONTRAST:  68mL ISOVUE-370 IOPAMIDOL (ISOVUE-370) INJECTION 76%  COMPARISON:  September 17, 2017  FINDINGS: Cardiovascular: The heart size is normal. There is no significant pericardial effusion. The ascending aorta measures approximately 3.4 cm. There is no evidence for an aortic dissection.  Mediastinum/Nodes:  --No mediastinal or hilar lymphadenopathy.  --No axillary lymphadenopathy.  --No supraclavicular lymphadenopathy.  --Normal thyroid gland.  --The esophagus is unremarkable  Lungs/Pleura: There is a stable 4 mm pulmonary nodule in the left lower lobe. No pneumothorax. No pleural effusion.  Upper Abdomen: No acute abnormality.  Musculoskeletal: No chest wall abnormality. No acute or significant osseous findings.  Review of the MIP images confirms the above findings.  IMPRESSION: 1. No aortic dissection. 2. No thoracic aortic aneurysm. 3. No acute cardiopulmonary process identified.   Electronically Signed   By: September 19, 2017 M.D.   On: 12/31/2018 11:36   Impression:  This 43 year old gentleman has mild enlargement of the ascending aorta measured at 3.4 cm on his current CTA.  I have personally reviewed this scan and my measurement is about 3.8 cm in the mid ascending aorta at the level of the right pulmonary artery.  His previous scan 2 years ago which showed a measurement of 4.7 cm but  this can clinically had a lot of motion artifact and grainy images.  His scan last year was measured at 4.0 cm but again had some motion artifact.  I think the current scan is much better quality and I am comfortable with the current measurement.  There is no coronary calcification to suggest coronary disease.  I reviewed the CTA study with him and answered his questions.  I stressed the importance of good blood pressure control  and preventing further enlargement and acute aortic dissection.  He had a 2D echocardiogram done in January 2019 which showed a normal trileaflet aortic valve without stenosis or regurgitation.  Given the small size of his ascending aortic aneurysm I think is reasonable to wait 2 years to repeat the study.  He is in agreement with that.  Plan:  He will return to see me in 2 years with a CTA of the chest.  I spent 15 minutes performing this established patient evaluation and > 50% of this time was spent face to face counseling and coordinating the care of this patient's aortic aneurysm.    Gaye Pollack, MD [facility] [phone]

## 2019-04-07 ENCOUNTER — Ambulatory Visit (INDEPENDENT_AMBULATORY_CARE_PROVIDER_SITE_OTHER): Payer: BC Managed Care – PPO | Admitting: Cardiology

## 2019-04-07 ENCOUNTER — Encounter: Payer: Self-pay | Admitting: Cardiology

## 2019-04-07 ENCOUNTER — Other Ambulatory Visit: Payer: Self-pay

## 2019-04-07 ENCOUNTER — Other Ambulatory Visit (INDEPENDENT_AMBULATORY_CARE_PROVIDER_SITE_OTHER): Payer: BC Managed Care – PPO

## 2019-04-07 VITALS — BP 148/96 | HR 72 | Ht 70.0 in | Wt 385.0 lb

## 2019-04-07 DIAGNOSIS — I429 Cardiomyopathy, unspecified: Secondary | ICD-10-CM

## 2019-04-07 DIAGNOSIS — I10 Essential (primary) hypertension: Secondary | ICD-10-CM

## 2019-04-07 DIAGNOSIS — I712 Thoracic aortic aneurysm, without rupture, unspecified: Secondary | ICD-10-CM

## 2019-04-07 DIAGNOSIS — E785 Hyperlipidemia, unspecified: Secondary | ICD-10-CM | POA: Diagnosis not present

## 2019-04-07 NOTE — Progress Notes (Signed)
Cardiology Office Note:    Date:  04/07/2019   ID:  Darren Carlson, DOB 1976/01/15, MRN [id number]  PCP:  Ma Hillock, DO  Cardiologist:  Jenne Campus, MD    Referring MD: Ma Hillock, DO   Chief Complaint  Patient presents with  . Follow-up    1 YR FU  Doing well  History of Present Illness:    Granvel Proudfoot is a 43 y.o. male with history of stenting aortic aneurysm however latest measurements showed smaller diameter both reported previously.  He was the previous CAT scan showed enlarged diameters secondary to motion artifact.  Overall he is doing fine he complained of having some shortness of breath as well as disappointment with the fact that he is gaining some weight.  But overall seems to be doing quite okay.  Past Medical History:  Diagnosis Date  . Asthma   . Environmental allergies   . GERD (gastroesophageal reflux disease)   . Hemorrhoids   . Obesity     Past Surgical History:  Procedure Laterality Date  . WISDOM TOOTH EXTRACTION      Current Medications: Current Meds  Medication Sig  . amLODipine (NORVASC) 10 MG tablet Take 1 tablet (10 mg total) by mouth daily. Needs office visit prior to any additional refills  . atorvastatin (LIPITOR) 40 MG tablet Take 1 tablet (40 mg total) by mouth daily.  . furosemide (LASIX) 20 MG tablet Take 2 tablets (40 mg total) by mouth daily.  Marland Kitchen lisinopril (ZESTRIL) 40 MG tablet Take 1 tablet (40 mg total) by mouth daily.  Marland Kitchen spironolactone (ALDACTONE) 25 MG tablet Take 2 tablets (50 mg total) by mouth daily. Needs office visit prior to any additional refills.     Allergies:   Amoxicillin and Penicillins   Social History   Socioeconomic History  . Marital status: Married    Spouse name: [name]  . Number of children: 2  . Years of education: 50  . Highest education level: Not on file  Occupational History  . Occupation: Regulatory affairs officer  Tobacco Use  . Smoking status: Never Smoker  . Smokeless tobacco: Never Used    Substance and Sexual Activity  . Alcohol use: Yes    Comment: occasional  . Drug use: No  . Sexual activity: Yes    Partners: Female    Birth control/protection: None  Other Topics Concern  . Not on file  Social History Narrative   Married to [place], 2 children.    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 Drinks caffeine.    Smoke alarm in the home. Wears seatbelt.    Feels safe in his relationships.    Social Determinants of Health   Financial Resource Strain:   . Difficulty of Paying Living Expenses: Not on file  Food Insecurity:   . Worried About Charity fundraiser in the Last Year: Not on file  . Ran Out of Food in the Last Year: Not on file  Transportation Needs:   . Lack of Transportation (Medical): Not on file  . Lack of Transportation (Non-Medical): Not on file  Physical Activity:   . Days of Exercise per Week: Not on file  . Minutes of Exercise per Session: Not on file  Stress:   . Feeling of Stress : Not on file  Social Connections:   . Frequency of Communication with Friends and Family: Not on file  . Frequency of Social Gatherings with Friends and Family: Not  on file  . Attends Religious Services: Not on file  . Active Member of Clubs or Organizations: Not on file  . Attends Banker Meetings: Not on file  . Marital Status: Not on file     Family History: The patient's family history includes Colon cancer in his cousin; Colon cancer (age of onset: 28) in his maternal aunt; Colon cancer (age of onset: 33) in his maternal grandmother; Diabetes in his paternal grandfather and paternal grandmother; Kidney disease in his paternal grandfather and paternal grandmother. There is no history of Stomach cancer, Pancreatic cancer, or Esophageal cancer. ROS:   Please see the history of present illness.    All 14 point review of systems negative except as described per history of present illness  EKGs/Labs/Other Studies  Reviewed:      Recent Labs: 06/24/2018: TSH 1.55 12/25/2018: ALT 20; BUN 13; Creatinine, Ser 1.20; Hemoglobin 14.3; Platelets 185.0; Potassium 4.3; Sodium 138  Recent Lipid Panel    Component Value Date/Time   CHOL 139 12/25/2018 0949   TRIG 101.0 12/25/2018 0949   HDL 41.30 12/25/2018 0949   CHOLHDL 3 12/25/2018 0949   VLDL 20.2 12/25/2018 0949   LDLCALC 78 12/25/2018 0949   LDLDIRECT 157 (H) 01/23/2017 1628    Physical Exam:    VS:  BP (!) 148/96   Pulse 72   Ht 5\' 10"  (1.778 m)   Wt (!) 385 lb (174.6 kg)   SpO2 99%   BMI 55.24 kg/m     Wt Readings from Last 3 Encounters:  04/07/19 (!) 385 lb (174.6 kg)  12/31/18 (!) 381 lb (172.8 kg)  12/25/18 (!) 382 lb 2 oz (173.3 kg)     GEN:  Well nourished, well developed in no acute distress HEENT: Normal NECK: No JVD; No carotid bruits LYMPHATICS: No lymphadenopathy CARDIAC: RRR, no murmurs, no rubs, no gallops RESPIRATORY:  Clear to auscultation without rales, wheezing or rhonchi  ABDOMEN: Soft, non-tender, non-distended MUSCULOSKELETAL:  No edema; No deformity  SKIN: Warm and dry LOWER EXTREMITIES: no swelling NEUROLOGIC:  Alert and oriented x 3 PSYCHIATRIC:  Normal affect   ASSESSMENT:    1. Cardiomyopathy, unspecified type (HCC)   2. Essential hypertension   3. Thoracic aortic aneurysm without rupture (HCC)   4. Hyperlipidemia LDL goal <100    PLAN:    In order of problems listed above:  1. Cardiomyopathy.  I will ask him to have another echocardiogram to assess left ventricle ejection fraction.  He stopped his beta-blocker because he thinks he gave him some strange side effect and he feels better will make a decision regarding restarting this medication based on results of his echocardiogram. 2. Essential hypertension mildly elevated today but he check it at home and always perfect between 1 20-1 30 systolic 3. Thoracic aneurysm actually smaller than refused to talk.  Doing well from that point review followed  by surgeons.  We will repeat follow-up and to years basis. 4. Dyslipidemia scheduled to see his primary care physician to have fasting lipid profile done.  Will wait for results of it.   Medication Adjustments/Labs and Tests Ordered: Current medicines are reviewed at length with the patient today.  Concerns regarding medicines are outlined above.  Orders Placed This Encounter  Procedures  . ECHOCARDIOGRAM COMPLETE   Medication changes: No orders of the defined types were placed in this encounter.   Signed, 12/27/18, MD, [facility] 04/07/2019 11:49 AM    [facility]

## 2019-04-07 NOTE — Patient Instructions (Signed)
Medication Instructions:  Your physician recommends that you continue on your current medications as directed. Please refer to the Current Medication list given to you today.  *If you need a refill on your cardiac medications before your next appointment, please call your pharmacy*  Lab Work: None ordered  If you have labs (blood work) drawn today and your tests are completely normal, you will receive your results only by: Marland Kitchen MyChart Message (if you have MyChart) OR . A paper copy in the mail If you have any lab test that is abnormal or we need to change your treatment, we will call you to review the results.  Testing/Procedures: Your physician has requested that you have an echocardiogram. Echocardiography is a painless test that uses sound waves to create images of your heart. It provides your doctor with information about the size and shape of your heart and how well your heart's chambers and valves are working. This procedure takes approximately one hour. There are no restrictions for this procedure.    Follow-Up: At [facility], you and your health needs are our priority.  As part of our continuing mission to provide you with exceptional heart care, we have created designated Provider Care Teams.  These Care Teams include your primary Cardiologist (physician) and Advanced Practice Providers (APPs -  Physician Assistants and Nurse Practitioners) who all work together to provide you with the care you need, when you need it.  Your next appointment:   6 month(s)  The format for your next appointment:   In Person  Provider:   Jenne Campus, MD  Other Instructions  Echocardiogram An echocardiogram is a procedure that uses painless sound waves (ultrasound) to produce an image of the heart. Images from an echocardiogram can provide important information about:  Signs of coronary artery disease (CAD).  Aneurysm detection. An aneurysm is a weak or damaged part of an artery wall  that bulges out from the normal force of blood pumping through the body.  Heart size and shape. Changes in the size or shape of the heart can be associated with certain conditions, including heart failure, aneurysm, and CAD.  Heart muscle function.  Heart valve function.  Signs of a past heart attack.  Fluid buildup around the heart.  Thickening of the heart muscle.  A tumor or infectious growth around the heart valves. Tell a health care provider about:  Any allergies you have.  All medicines you are taking, including vitamins, herbs, eye drops, creams, and over-the-counter medicines.  Any blood disorders you have.  Any surgeries you have had.  Any medical conditions you have.  Whether you are pregnant or may be pregnant. What are the risks? Generally, this is a safe procedure. However, problems may occur, including:  Allergic reaction to dye (contrast) that may be used during the procedure. What happens before the procedure? No specific preparation is needed. You may eat and drink normally. What happens during the procedure?   An IV tube may be inserted into one of your veins.  You may receive contrast through this tube. A contrast is an injection that improves the quality of the pictures from your heart.  A gel will be applied to your chest.  A wand-like tool (transducer) will be moved over your chest. The gel will help to transmit the sound waves from the transducer.  The sound waves will harmlessly bounce off of your heart to allow the heart images to be captured in real-time motion. The images will be recorded  on a computer. The procedure may vary among health care providers and hospitals. What happens after the procedure?  You may return to your normal, everyday life, including diet, activities, and medicines, unless your health care provider tells you not to do that. Summary  An echocardiogram is a procedure that uses painless sound waves (ultrasound) to  produce an image of the heart.  Images from an echocardiogram can provide important information about the size and shape of your heart, heart muscle function, heart valve function, and fluid buildup around your heart.  You do not need to do anything to prepare before this procedure. You may eat and drink normally.  After the echocardiogram is completed, you may return to your normal, everyday life, unless your health care provider tells you not to do that. This information is not intended to replace advice given to you by your health care provider. Make sure you discuss any questions you have with your health care provider. Document Released: 03/23/2000 Document Revised: 07/17/2018 Document Reviewed: 04/28/2016 Elsevier Patient Education  2020 [employer].

## 2019-04-14 ENCOUNTER — Other Ambulatory Visit: Payer: Self-pay

## 2019-04-14 ENCOUNTER — Ambulatory Visit (HOSPITAL_BASED_OUTPATIENT_CLINIC_OR_DEPARTMENT_OTHER)
Admission: RE | Admit: 2019-04-14 | Discharge: 2019-04-14 | Disposition: A | Discharge status: Home / Self Care | Payer: BC Managed Care – PPO | Source: Ambulatory Visit | Attending: Cardiology | Admitting: Cardiology

## 2019-04-14 DIAGNOSIS — I429 Cardiomyopathy, unspecified: Secondary | ICD-10-CM | POA: Diagnosis not present

## 2019-04-14 MED ORDER — PERFLUTREN LIPID MICROSPHERE
1.0000 mL | INTRAVENOUS | Status: AC | PRN
  Administered 2019-04-14: 3 mL via INTRAVENOUS
  Filled 2019-04-14: qty 10

## 2019-04-14 NOTE — Progress Notes (Signed)
  Echocardiogram 2D Echocardiogram has been performed.  Darren Carlson 04/14/2019, 10:17 AM

## 2019-04-21 LAB — HM DIABETES EYE EXAM

## 2019-04-22 ENCOUNTER — Encounter: Payer: Self-pay | Admitting: Family Medicine

## 2019-08-09 ENCOUNTER — Encounter: Payer: Self-pay | Admitting: Family Medicine

## 2019-08-10 ENCOUNTER — Other Ambulatory Visit: Payer: Self-pay

## 2019-08-10 MED ORDER — ATORVASTATIN CALCIUM 40 MG PO TABS: 40.0000 mg | ORAL_TABLET | Freq: Every day | ORAL | 0 refills | Status: DC

## 2019-08-14 ENCOUNTER — Encounter: Payer: Self-pay | Admitting: Family Medicine

## 2019-08-14 ENCOUNTER — Ambulatory Visit: Payer: BC Managed Care – PPO | Admitting: Family Medicine

## 2019-08-14 ENCOUNTER — Other Ambulatory Visit: Payer: Self-pay

## 2019-08-14 VITALS — BP 140/86 | HR 84 | Temp 97.7°F | Resp 19 | Ht 70.0 in | Wt 373.5 lb

## 2019-08-14 DIAGNOSIS — Z6841 Body Mass Index (BMI) 40.0 and over, adult: Secondary | ICD-10-CM

## 2019-08-14 DIAGNOSIS — E119 Type 2 diabetes mellitus without complications: Secondary | ICD-10-CM

## 2019-08-14 DIAGNOSIS — E785 Hyperlipidemia, unspecified: Secondary | ICD-10-CM

## 2019-08-14 DIAGNOSIS — I5041 Acute combined systolic (congestive) and diastolic (congestive) heart failure: Secondary | ICD-10-CM | POA: Diagnosis not present

## 2019-08-14 DIAGNOSIS — I10 Essential (primary) hypertension: Secondary | ICD-10-CM

## 2019-08-14 DIAGNOSIS — R600 Localized edema: Secondary | ICD-10-CM

## 2019-08-14 LAB — POCT GLYCOSYLATED HEMOGLOBIN (HGB A1C)
HbA1c POC (<> result, manual entry): 6.8 % (ref 4.0–5.6)
HbA1c, POC (controlled diabetic range): 6.8 % (ref 0.0–7.0)
HbA1c, POC (prediabetic range): 6.8 % — AB (ref 5.7–6.4)
Hemoglobin A1C: 6.8 % — AB (ref 4.0–5.6)

## 2019-08-14 MED ORDER — SPIRONOLACTONE 25 MG PO TABS: 50.0000 mg | ORAL_TABLET | Freq: Every day | ORAL | 1 refills | Status: DC

## 2019-08-14 MED ORDER — LISINOPRIL 40 MG PO TABS: 40.0000 mg | ORAL_TABLET | Freq: Every day | ORAL | 1 refills | Status: DC

## 2019-08-14 MED ORDER — FUROSEMIDE 20 MG PO TABS: 40.0000 mg | ORAL_TABLET | Freq: Every day | ORAL | 1 refills | Status: DC

## 2019-08-14 MED ORDER — METFORMIN HCL 500 MG PO TABS
500.0000 mg | ORAL_TABLET | Freq: Every day | ORAL | 1 refills | Status: DC
Start: 2019-08-14 — End: 2019-12-23

## 2019-08-14 MED ORDER — ATORVASTATIN CALCIUM 40 MG PO TABS: 40.0000 mg | ORAL_TABLET | Freq: Every day | ORAL | 1 refills | Status: DC

## 2019-08-14 MED ORDER — AMLODIPINE BESYLATE 10 MG PO TABS: 10.0000 mg | ORAL_TABLET | Freq: Every day | ORAL | 1 refills | Status: DC

## 2019-08-14 NOTE — Progress Notes (Signed)
Patient ID: Darren Carlson, male  DOB: Aug 18, 1975, 44 y.o.   MRN: [id number] Patient Care Team    Relationship Specialty Notifications Start End  Natalia Leatherwood, DO PCP - General Family Medicine  01/23/17   Alleen Borne, MD Consulting Physician Cardiothoracic Surgery  05/03/17   Georgeanna Lea, MD Consulting Physician Cardiology  05/03/17     Chief Complaint  Patient presents with  . Diabetes    Needs refills on medications.   . Hyperlipidemia  . Hypertension    Subjective:Darren Carlson is a 44 y.o. male present for [facility] Darren Carlson is a 44 y.o.  male present for follow-up on diabetes/metabolic syndrome/obesity: A1c 5.8>6.8>6.3>6.8 today.  Patient never started metformin. He has worked hard on exercise and diet changes. He has lost weight. Patient denies dizziness, hyperglycemic or hypoglycemic events. Patient denies numbness, tingling in the extremities or nonhealing wounds of feet.    hypertension/hyperlipidemia/acute combined systolic and diastolic heart failure/cardiomyopathy Pt reports compliance amlodipine 10 mg daily, Aldactone 50 mg daily, lisinopril 40 mg daily, Lasix 40 mg daily and spirolactone 50 mg qd.Patient denies chest pain, shortness of breath, dizziness or lower extremity edema. .  Pt takes a daily baby ASA.  Coreg discontinued and metoprolol XL discontinued (by pt) secondary to side effects. He has established with cardiology and CTS.  Thoracic aortic aneurysm 4.7 x 4.4 cm. --> on rpt image it is not felt he has an aneurysm. He is at the upper limits of normal. Original image felt to have artifact. Labs UTD Microalbumin:  on ace Diet: Watching diet very closely, low-sodium Exercise: Routine exercise RF: Hypertension, hyperlipidemia, obesity, dilated cardiomyopathy, thoracic aortic aneurysm.  CTA 09/17/2017: CLINICAL DATA:  Follow-up of aneurysmal disease of the ascending thoracic aorta. EXAM: CT ANGIOGRAPHY CHEST WITH CONTRAST CONTRAST:   ISOVUE-370 IOPAMIDOL (ISOVUE-370) INJECTION 76% COMPARISON:  03/22/2017 IMPRESSION: Previous aortic dimensions are felt to be overestimated due to motion artifact. The ascending thoracic aorta actually measures 4 cm in greatest diameter, which is at the upper limits of normal.  CT anterior chest 03/12/2017: IMPRESSION: 1. Ascending thoracic aorta measures 4.7 x 4.4 cm.  2.  No demonstrable pulmonary embolus. 3. There is prominence of the main pulmonary outflow tract with a measured transverse diameter of 3.9 cm. This finding is concerning for underlying pulmonary arterial hypertension. 4.  No lung edema or consolidation. 5.  No evident thoracic adenopathy.  Echocardiogram 04/26/2017: LV EF: 55-65 % Study Conclusions - Left ventricle: The cavity size was normal. There was moderate   concentric hypertrophy. Systolic function was normal. The   estimated ejection fraction was in the range of 55% to 65%. Wall   motion was normal; there were no regional wall motion   abnormalities. The study is not technically sufficient to allow   evaluation of LV diastolic function. - Aortic valve: Valve area (VTI): 3.44 cm^2. Valve area (Vmax):   3.05 cm^2. Valve area (Vmean): 2.95 cm^2. Impressions: - Normal LVEF.   Moderate LVH.   Trace MR.  Echocardiogram 01/29/2017: LV EF: 35% -   40% Study Conclusions - Left ventricle: The cavity size was normal. Wall thickness was   increased in a pattern of mild LVH. There was mild concentric   hypertrophy. Systolic function was moderately reduced. The   estimated ejection fraction was in the range of 35% to 40%.   Diffuse hypokinesis. Features are consistent with a pseudonormal   left ventricular filling pattern, with concomitant abnormal   relaxation and  increased filling pressure (grade 2 diastolic   dysfunction). Doppler parameters are consistent with high   ventricular filling pressure. - Left atrium: The atrium was moderately to severely  dilated. - Cardiac MRI is recommended.   Depression screen [facility] 2/9 06/24/2018 12/16/2017 01/23/2017  Decreased Interest 0 0 0  Down, Depressed, Hopeless 0 0 0  PHQ - 2 Score 0 0 0  Altered sleeping 0 - -  Tired, decreased energy 0 - -  Change in appetite 1 - -  Feeling bad or failure about yourself  0 - -  Trouble concentrating 0 - -  Moving slowly or fidgety/restless 0 - -  Suicidal thoughts 0 - -  PHQ-9 Score 1 - -  Difficult doing work/chores Not difficult at all - -    Past Medical History:  Diagnosis Date  . Asthma   . Environmental allergies   . GERD (gastroesophageal reflux disease)   . Hemorrhoids   . Obesity    Allergies  Allergen Reactions  . Amoxicillin Rash  . Penicillins Rash   Past Surgical History:  Procedure Laterality Date  . WISDOM TOOTH EXTRACTION     Family History  Problem Relation Age of Onset  . Colon cancer Maternal Grandmother 52       early death  . Diabetes Paternal Grandmother   . Kidney disease Paternal Grandmother   . Diabetes Paternal Grandfather   . Kidney disease Paternal Grandfather   . Colon cancer Maternal Aunt 35  . Colon cancer Cousin   . Stomach cancer Neg Hx   . Pancreatic cancer Neg Hx   . Esophageal cancer Neg Hx    Social History   Socioeconomic History  . Marital status: Married    Spouse name: Jaymes Graff  . Number of children: 2  . Years of education: 86  . Highest education level: Not on file  Occupational History  . Occupation: Sports administrator  Tobacco Use  . Smoking status: Never Smoker  . Smokeless tobacco: Never Used  Substance and Sexual Activity  . Alcohol use: Yes    Comment: occasional  . Drug use: No  . Sexual activity: Yes    Partners: Female    Birth control/protection: None  Other Topics Concern  . Not on file  Social History Narrative   Married to [place], 2 children.    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 Drinks caffeine.    Smoke alarm in the home.  Wears seatbelt.    Feels safe in his relationships.    Social Determinants of Health   Financial Resource Strain:   . Difficulty of Paying Living Expenses:   Food Insecurity:   . Worried About Programme researcher, broadcasting/film/video in the Last Year:   . Barista in the Last Year:   Transportation Needs:   . Freight forwarder (Medical):   Marland Kitchen Lack of Transportation (Non-Medical):   Physical Activity:   . Days of Exercise per Week:   . Minutes of Exercise per Session:   Stress:   . Feeling of Stress :   Social Connections:   . Frequency of Communication with Friends and Family:   . Frequency of Social Gatherings with Friends and Family:   . Attends Religious Services:   . Active Member of Clubs or Organizations:   . Attends Banker Meetings:   Marland Kitchen Marital Status:   Intimate Partner Violence:   . Fear of Current or Ex-Partner:   .  Emotionally Abused:   Marland Kitchen Physically Abused:   . Sexually Abused:    Allergies as of 08/14/2019      Reactions   Amoxicillin Rash   Penicillins Rash      Medication List       Accurate as of Aug 14, 2019 10:38 AM. If you have any questions, ask your nurse or doctor.        STOP taking these medications   aspirin EC 81 MG tablet Stopped by: Felix Pacini, DO   metoprolol succinate 50 MG 24 hr tablet Commonly known as: TOPROL-XL Stopped by: Felix Pacini, DO   terbinafine 250 MG tablet Commonly known as: LAMISIL Stopped by: Felix Pacini, DO     TAKE these medications   amLODipine 10 MG tablet Commonly known as: NORVASC Take 1 tablet (10 mg total) by mouth daily. Needs office visit prior to any additional refills   atorvastatin 40 MG tablet Commonly known as: LIPITOR Take 1 tablet (40 mg total) by mouth daily.   furosemide 20 MG tablet Commonly known as: LASIX Take 2 tablets (40 mg total) by mouth daily.   lisinopril 40 MG tablet Commonly known as: ZESTRIL Take 1 tablet (40 mg total) by mouth daily.   loratadine 10 MG  tablet Commonly known as: CLARITIN Take 10 mg by mouth daily.   spironolactone 25 MG tablet Commonly known as: ALDACTONE Take 2 tablets (50 mg total) by mouth daily. Needs office visit prior to any additional refills.       All past medical history, surgical history, allergies, family history, immunizations andmedications were updated in the EMR today and reviewed under the history and medication portions of their EMR.    No results found for this or any previous visit (from the past 2160 hour(s)).  Patient was never admitted.   ROS: 14 pt review of systems performed and negative (unless mentioned in an HPI)  Objective: BP 140/86 (BP Location: Right Arm, Patient Position: Sitting, Cuff Size: Large)   Pulse 84   Temp 97.7 F (36.5 C) (Temporal)   Resp 19   Ht 5\' 10"  (1.778 m)   Wt (!) 373 lb 8 oz (169.4 kg)   SpO2 100%   BMI 53.59 kg/m   Gen: Afebrile. No acute distress. Nontoxic. Obese caucasian male.  HENT: AT. [place]. MMM.  Eyes:Pupils Equal Round Reactive to light, Extraocular movements intact,  Conjunctiva without redness, discharge or icterus. Neck/lymp/endocrine: Supple,no lymphadenopathy, no thyromegaly CV: RRR no murmur, trace left Lower edema Chest: CTAB, no wheeze or crackles Abd: Soft. NTND. BS present Skin: no rashes, purpura or petechiae.  Neuro:  Normal gait. PERLA. EOMi. Alert. Oriented x3  Psych: Normal affect, dress and demeanor. Normal speech. Normal thought content and judgment.  Assessment/plan: Darren Carlson is a 44 y.o. male present for establishment visit with  Essential hypertension/dyspnea on exertion/morbid obesity Acute combined systolic and diastolic heart failure (HCC) - mildly above goal today> he only took his medicine just before appt.  - Ejection fraction improved, return to normal. LVH, trace MR. - Thoracic aortic aneurysm --> repeat imaging felt he did not have an  aneurysm and original was artifact.  - continue  amlodipine 10 mg daily -  continue lisinopril 40 mg daily - continue Lasix 40 mg daily - continue  Aldactone 50 mg daily.   - Continue daily baby aspirin. - continue atorvastatin 40 mg daily. - Continue follow-up per routine schedule with cardiology and cardiothoracic surgeon. - Follow-up 4 months, if BP routinely  above 130/80 at home or at follow up will alter regimen.   diabetes/metabolic syndrome/morbid obesity: - Pt agreeable to start low dose metformin 500 QD A1c 6.4--> 6.3 --> 5.8-->6.8>6.3>6.8  today.  Strongly encouraged him to start metformin > he is agreeable today.  - Continue to diet and exercise. - discussed weight loss medication with him again last visit >>Contrave prescribed- last OV>> did take too expensive.  -EYE: 04/21/2019 Dr. Oswaldo Conroy -He declined his pneumonia vaccines and his flu vaccine. -Foot exam completed 12/25/2018 - f/u 4 months  Orders Placed This Encounter  Procedures  . POCT glycosylated hemoglobin (Hb A1C)   Meds ordered this encounter  Medications  . spironolactone (ALDACTONE) 25 MG tablet    Sig: Take 2 tablets (50 mg total) by mouth daily. Needs office visit prior to any additional refills.    Dispense:  180 tablet    Refill:  1  . lisinopril (ZESTRIL) 40 MG tablet    Sig: Take 1 tablet (40 mg total) by mouth daily.    Dispense:  90 tablet    Refill:  1  . furosemide (LASIX) 20 MG tablet    Sig: Take 2 tablets (40 mg total) by mouth daily.    Dispense:  180 tablet    Refill:  1  . atorvastatin (LIPITOR) 40 MG tablet    Sig: Take 1 tablet (40 mg total) by mouth daily.    Dispense:  90 tablet    Refill:  1  . amLODipine (NORVASC) 10 MG tablet    Sig: Take 1 tablet (10 mg total) by mouth daily. Needs office visit prior to any additional refills    Dispense:  90 tablet    Refill:  1  . metFORMIN (GLUCOPHAGE) 500 MG tablet    Sig: Take 1 tablet (500 mg total) by mouth daily with breakfast.    Dispense:  90 tablet    Refill:  1    Hold until pt request    Note is  dictated utilizing voice recognition software. Although note has been proof read prior to signing, occasional typographical errors still can be missed. If any questions arise, please do not hesitate to call for verification.   Electronically signed by: Howard Pouch, DO [place]

## 2019-08-14 NOTE — Patient Instructions (Addendum)
Nice to see you today.   a1c 6.8. Diabetes is a1c over 6.5. Metformin once daily is recommended.   I have refilled your medications today.  Follow up in 4 months.    Diabetes Mellitus and Nutrition, Adult When you have diabetes (diabetes mellitus), it is very important to have healthy eating habits because your blood sugar (glucose) levels are greatly affected by what you eat and drink. Eating healthy foods in the appropriate amounts, at about the same times every day, can help you:  Control your blood glucose.  Lower your risk of heart disease.  Improve your blood pressure.  Reach or maintain a healthy weight. Every person with diabetes is different, and each person has different needs for a meal plan. Your health care provider may recommend that you work with a diet and nutrition specialist (dietitian) to make a meal plan that is best for you. Your meal plan may vary depending on factors such as:  The calories you need.  The medicines you take.  Your weight.  Your blood glucose, blood pressure, and cholesterol levels.  Your activity level.  Other health conditions you have, such as heart or kidney disease. How do carbohydrates affect me? Carbohydrates, also called carbs, affect your blood glucose level more than any other type of food. Eating carbs naturally raises the amount of glucose in your blood. Carb counting is a method for keeping track of how many carbs you eat. Counting carbs is important to keep your blood glucose at a healthy level, especially if you use insulin or take certain oral diabetes medicines. It is important to know how many carbs you can safely have in each meal. This is different for every person. Your dietitian can help you calculate how many carbs you should have at each meal and for each snack. Foods that contain carbs include:  Bread, cereal, rice, pasta, and crackers.  Potatoes and corn.  Peas, beans, and lentils.  Milk and yogurt.  Fruit  and juice.  Desserts, such as cakes, cookies, ice cream, and candy. How does alcohol affect me? Alcohol can cause a sudden decrease in blood glucose (hypoglycemia), especially if you use insulin or take certain oral diabetes medicines. Hypoglycemia can be a life-threatening condition. Symptoms of hypoglycemia (sleepiness, dizziness, and confusion) are similar to symptoms of having too much alcohol. If your health care provider says that alcohol is safe for you, follow these guidelines:  Limit alcohol intake to no more than 1 drink per day for nonpregnant women and 2 drinks per day for men. One drink equals 12 oz of beer, 5 oz of wine, or 1 oz of hard liquor.  Do not drink on an empty stomach.  Keep yourself hydrated with water, diet soda, or unsweetened iced tea.  Keep in mind that regular soda, juice, and other mixers may contain a lot of sugar and must be counted as carbs. What are tips for following this plan?  Reading food labels  Start by checking the serving size on the "Nutrition Facts" label of packaged foods and drinks. The amount of calories, carbs, fats, and other nutrients listed on the label is based on one serving of the item. Many items contain more than one serving per package.  Check the total grams (g) of carbs in one serving. You can calculate the number of servings of carbs in one serving by dividing the total carbs by 15. For example, if a food has 30 g of total carbs, it would be equal  to 2 servings of carbs.  Check the number of grams (g) of saturated and trans fats in one serving. Choose foods that have low or no amount of these fats.  Check the number of milligrams (mg) of salt (sodium) in one serving. Most people should limit total sodium intake to less than 2,300 mg per day.  Always check the nutrition information of foods labeled as "low-fat" or "nonfat". These foods may be higher in added sugar or refined carbs and should be avoided.  Talk to your dietitian  to identify your daily goals for nutrients listed on the label. Shopping  Avoid buying canned, premade, or processed foods. These foods tend to be high in fat, sodium, and added sugar.  Shop around the outside edge of the grocery store. This includes fresh fruits and vegetables, bulk grains, fresh meats, and fresh dairy. Cooking  Use low-heat cooking methods, such as baking, instead of high-heat cooking methods like deep frying.  Cook using healthy oils, such as olive, canola, or sunflower oil.  Avoid cooking with butter, cream, or high-fat meats. Meal planning  Eat meals and snacks regularly, preferably at the same times every day. Avoid going long periods of time without eating.  Eat foods high in fiber, such as fresh fruits, vegetables, beans, and whole grains. Talk to your dietitian about how many servings of carbs you can eat at each meal.  Eat 4-6 ounces (oz) of lean protein each day, such as lean meat, chicken, fish, eggs, or tofu. One oz of lean protein is equal to: ? 1 oz of meat, chicken, or fish. ? 1 egg. ?  cup of tofu.  Eat some foods each day that contain healthy fats, such as avocado, nuts, seeds, and fish. Lifestyle  Check your blood glucose regularly.  Exercise regularly as told by your health care provider. This may include: ? 150 minutes of moderate-intensity or vigorous-intensity exercise each week. This could be brisk walking, biking, or water aerobics. ? Stretching and doing strength exercises, such as yoga or weightlifting, at least 2 times a week.  Take medicines as told by your health care provider.  Do not use any products that contain nicotine or tobacco, such as cigarettes and e-cigarettes. If you need help quitting, ask your health care provider.  Work with a Veterinary surgeon or diabetes educator to identify strategies to manage stress and any emotional and social challenges. Questions to ask a health care provider  Do I need to meet with a diabetes  educator?  Do I need to meet with a dietitian?  What number can I call if I have questions?  When are the best times to check my blood glucose? Where to find more information:  American Diabetes Association: diabetes.org  Academy of Nutrition and Dietetics: www.eatright.[employer] of Diabetes and Digestive and Kidney Diseases (NIH): CarFlippers.tn Summary  A healthy meal plan will help you control your blood glucose and maintain a healthy lifestyle.  Working with a diet and nutrition specialist (dietitian) can help you make a meal plan that is best for you.  Keep in mind that carbohydrates (carbs) and alcohol have immediate effects on your blood glucose levels. It is important to count carbs and to use alcohol carefully. This information is not intended to replace advice given to you by your health care provider. Make sure you discuss any questions you have with your health care provider. Document Revised: 03/08/2017 Document Reviewed: 04/30/2016 Elsevier Patient Education  2020 [employer].

## 2019-12-15 DIAGNOSIS — D225 Melanocytic nevi of trunk: Secondary | ICD-10-CM | POA: Diagnosis not present

## 2019-12-15 DIAGNOSIS — L814 Other melanin hyperpigmentation: Secondary | ICD-10-CM | POA: Diagnosis not present

## 2019-12-16 ENCOUNTER — Ambulatory Visit: Payer: BC Managed Care – PPO | Admitting: Family Medicine

## 2019-12-23 ENCOUNTER — Encounter: Payer: Self-pay | Admitting: Family Medicine

## 2019-12-23 ENCOUNTER — Ambulatory Visit: Payer: BC Managed Care – PPO | Admitting: Family Medicine

## 2019-12-23 ENCOUNTER — Other Ambulatory Visit: Payer: Self-pay

## 2019-12-23 VITALS — BP 127/82 | HR 80 | Temp 98.1°F | Ht 70.0 in | Wt 366.0 lb

## 2019-12-23 DIAGNOSIS — I10 Essential (primary) hypertension: Secondary | ICD-10-CM

## 2019-12-23 DIAGNOSIS — R600 Localized edema: Secondary | ICD-10-CM

## 2019-12-23 DIAGNOSIS — E785 Hyperlipidemia, unspecified: Secondary | ICD-10-CM

## 2019-12-23 DIAGNOSIS — I5041 Acute combined systolic (congestive) and diastolic (congestive) heart failure: Secondary | ICD-10-CM | POA: Diagnosis not present

## 2019-12-23 DIAGNOSIS — I420 Dilated cardiomyopathy: Secondary | ICD-10-CM

## 2019-12-23 DIAGNOSIS — Z6841 Body Mass Index (BMI) 40.0 and over, adult: Secondary | ICD-10-CM

## 2019-12-23 DIAGNOSIS — E119 Type 2 diabetes mellitus without complications: Secondary | ICD-10-CM | POA: Diagnosis not present

## 2019-12-23 LAB — CBC
HCT: 43.1 % (ref 39.0–52.0)
Hemoglobin: 14.6 g/dL (ref 13.0–17.0)
MCHC: 33.9 g/dL (ref 30.0–36.0)
MCV: 84.2 fl (ref 78.0–100.0)
Platelets: 193 10*3/uL (ref 150.0–400.0)
RBC: 5.12 Mil/uL (ref 4.22–5.81)
RDW: 13.1 % (ref 11.5–15.5)
WBC: 7.7 10*3/uL (ref 4.0–10.5)

## 2019-12-23 LAB — COMPREHENSIVE METABOLIC PANEL
ALT: 19 U/L (ref 0–53)
AST: 16 U/L (ref 0–37)
Albumin: 4.6 g/dL (ref 3.5–5.2)
Alkaline Phosphatase: 42 U/L (ref 39–117)
BUN: 15 mg/dL (ref 6–23)
CO2: 28 mEq/L (ref 19–32)
Calcium: 9.7 mg/dL (ref 8.4–10.5)
Chloride: 99 mEq/L (ref 96–112)
Creatinine, Ser: 1.17 mg/dL (ref 0.40–1.50)
GFR: 68.02 mL/min (ref >60.00)
Glucose, Bld: 118 mg/dL — ABNORMAL HIGH (ref 70–99)
Potassium: 4.8 mEq/L (ref 3.5–5.1)
Sodium: 136 mEq/L (ref 135–145)
Total Bilirubin: 1.2 mg/dL (ref 0.2–1.2)
Total Protein: 7.1 g/dL (ref 6.0–8.3)

## 2019-12-23 LAB — LIPID PANEL
Cholesterol: 143 mg/dL (ref 0–200)
HDL: 45.7 mg/dL (ref >39.00)
LDL Cholesterol: 78 mg/dL (ref 0–99)
NonHDL: 97.4
Total CHOL/HDL Ratio: 3
Triglycerides: 99 mg/dL (ref 0.0–149.0)
VLDL: 19.8 mg/dL (ref 0.0–40.0)

## 2019-12-23 LAB — TSH: TSH: 1.57 u[IU]/mL (ref 0.35–4.50)

## 2019-12-23 LAB — POCT GLYCOSYLATED HEMOGLOBIN (HGB A1C)
HbA1c POC (<> result, manual entry): 5.9 % (ref 4.0–5.6)
HbA1c, POC (controlled diabetic range): 5.9 % (ref 0.0–7.0)
HbA1c, POC (prediabetic range): 5.9 % (ref 5.7–6.4)
Hemoglobin A1C: 5.9 % — AB (ref 4.0–5.6)

## 2019-12-23 MED ORDER — FUROSEMIDE 20 MG PO TABS: 40.0000 mg | ORAL_TABLET | Freq: Every day | ORAL | 1 refills | Status: DC

## 2019-12-23 MED ORDER — SPIRONOLACTONE 25 MG PO TABS: 50.0000 mg | ORAL_TABLET | Freq: Every day | ORAL | 1 refills | Status: DC

## 2019-12-23 MED ORDER — AMLODIPINE BESYLATE 10 MG PO TABS: 10.0000 mg | ORAL_TABLET | Freq: Every day | ORAL | 1 refills | Status: DC

## 2019-12-23 MED ORDER — METFORMIN HCL 500 MG PO TABS: 500.0000 mg | ORAL_TABLET | Freq: Every day | ORAL | 1 refills | Status: DC

## 2019-12-23 MED ORDER — ATORVASTATIN CALCIUM 40 MG PO TABS: 40.0000 mg | ORAL_TABLET | Freq: Every day | ORAL | 1 refills | Status: DC

## 2019-12-23 MED ORDER — LISINOPRIL 40 MG PO TABS: 40.0000 mg | ORAL_TABLET | Freq: Every day | ORAL | 1 refills | Status: DC

## 2019-12-23 NOTE — Patient Instructions (Addendum)
Your BP and a1c looks great today.  Continue current meds. I have refilled all your medications.   We will call lab results

## 2019-12-23 NOTE — Progress Notes (Signed)
Patient ID: Darren Carlson, male  DOB: [birth year], 44 y.o.   MRN: [id number] Patient Care Team    Relationship Specialty Notifications Start End  Ma Hillock, DO PCP - General Family Medicine  01/23/17   Gaye Pollack, MD Consulting Physician Cardiothoracic Surgery  05/03/17   Park Liter, MD Consulting Physician Cardiology  05/03/17     Chief Complaint  Patient presents with  . Follow-up    [facility]    Subjective:Darren Carlson is a 44 y.o. male present for No Name is a 44 y.o.  male present for follow-up on diabetes/metabolic syndrome/obesity:  He has worked hard on exercise and diet changes. He has lost weight. Patient denies dizziness, hyperglycemic or hypoglycemic events. Patient denies numbness, tingling in the extremities or nonhealing wounds of feet.  He has started metformin QD.   hypertension/hyperlipidemia/acute combined systolic and diastolic heart failure/cardiomyopathy Pt reports compliance  amlodipine 10 mg daily,  lisinopril 40 mg daily, Lasix 40 mg daily and spirolactone 50 mg qd.Patient denies chest pain, shortness of breath, dizziness or lower extremity edema.  Pt takes a daily baby ASA.  Coreg discontinued and metoprolol XL discontinued (by pt) secondary to side effects. He has established with cardiology and CTS.  Thoracic aortic aneurysm 4.7 x 4.4 cm. --> on rpt image it is not felt he has an aneurysm. He is at the upper limits of normal. Original image felt to have artifact. Labs due Microalbumin:  on ace Diet: Watching diet very closely, low-sodium Exercise: Routine exercise RF: Hypertension, hyperlipidemia, obesity, dilated cardiomyopathy, thoracic aortic aneurysm.  CTA 09/17/2017: CLINICAL DATA:  Follow-up of aneurysmal disease of the ascending thoracic aorta. EXAM: CT ANGIOGRAPHY CHEST WITH CONTRAST CONTRAST:  132m ISOVUE-370 IOPAMIDOL (ISOVUE-370) INJECTION 76% COMPARISON:  03/22/2017 IMPRESSION: Previous aortic dimensions are felt to  be overestimated due to motion artifact. The ascending thoracic aorta actually measures 4 cm in greatest diameter, which is at the upper limits of normal.  CT anterior chest 03/12/2017: IMPRESSION: 1. Ascending thoracic aorta measures 4.7 x 4.4 cm.  2.  No demonstrable pulmonary embolus. 3. There is prominence of the main pulmonary outflow tract with a measured transverse diameter of 3.9 cm. This finding is concerning for underlying pulmonary arterial hypertension. 4.  No lung edema or consolidation. 5.  No evident thoracic adenopathy.  Echocardiogram 04/26/2017: LV EF: 55-65 % Study Conclusions - Left ventricle: The cavity size was normal. There was moderate   concentric hypertrophy. Systolic function was normal. The   estimated ejection fraction was in the range of 55% to 65%. Wall   motion was normal; there were no regional wall motion   abnormalities. The study is not technically sufficient to allow   evaluation of LV diastolic function. - Aortic valve: Valve area (VTI): 3.44 cm^2. Valve area (Vmax):   3.05 cm^2. Valve area (Vmean): 2.95 cm^2. Impressions: - Normal LVEF.   Moderate LVH.   Trace MR.  [facility] 12/23/2019 06/24/2018 12/16/2017 01/23/2017  Decreased Interest 0 0 0 0  Down, Depressed, Hopeless 0 0 0 0  PHQ - 2 Score 0 0 0 0  Altered sleeping - 0 - -  Tired, decreased energy - 0 - -  Change in appetite - 1 - -  Feeling bad or failure about yourself  - 0 - -  Trouble concentrating - 0 - -  Moving slowly or fidgety/restless - 0 - -  Suicidal thoughts - 0 - -  PHQ-9 Score - 1 - -  Difficult doing work/chores - Not difficult at all - -    Past Medical History:  Diagnosis Date  . Asthma   . Environmental allergies   . GERD (gastroesophageal reflux disease)   . Hemorrhoids   . Obesity    Allergies  Allergen Reactions  . Amoxicillin Rash  . Penicillins Rash   Past Surgical History:  Procedure Laterality Date  . WISDOM TOOTH EXTRACTION      Family History  Problem Relation Age of Onset  . Colon cancer Maternal Grandmother 74       early death  . Diabetes Paternal Grandmother   . Kidney disease Paternal Grandmother   . Diabetes Paternal Grandfather   . Kidney disease Paternal Grandfather   . Colon cancer Maternal Aunt 87  . Colon cancer Cousin   . Stomach cancer Neg Hx   . Pancreatic cancer Neg Hx   . Esophageal cancer Neg Hx    Social History   Socioeconomic History  . Marital status: Married    Spouse name: [name]  . Number of children: 2  . Years of education: 54  . Highest education level: Not on file  Occupational History  . Occupation: Regulatory affairs officer  Tobacco Use  . Smoking status: Never Smoker  . Smokeless tobacco: Never Used  Vaping Use  . Vaping Use: Never used  Substance and Sexual Activity  . Alcohol use: Yes    Comment: occasional  . Drug use: No  . Sexual activity: Yes    Partners: Female    Birth control/protection: None  Other Topics Concern  . Not on file  Social History Narrative   Married to [place], 2 children.    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 Drinks caffeine.    Smoke alarm in the home. Wears seatbelt.    Feels safe in his relationships.    Social Determinants of Health   Financial Resource Strain:   . Difficulty of Paying Living Expenses: Not on file  Food Insecurity:   . Worried About Charity fundraiser in the Last Year: Not on file  . Ran Out of Food in the Last Year: Not on file  Transportation Needs:   . Lack of Transportation (Medical): Not on file  . Lack of Transportation (Non-Medical): Not on file  Physical Activity:   . Days of Exercise per Week: Not on file  . Minutes of Exercise per Session: Not on file  Stress:   . Feeling of Stress : Not on file  Social Connections:   . Frequency of Communication with Friends and Family: Not on file  . Frequency of Social Gatherings with Friends and Family: Not on file  .  Attends Religious Services: Not on file  . Active Member of Clubs or Organizations: Not on file  . Attends Archivist Meetings: Not on file  . Marital Status: Not on file  Intimate Partner Violence:   . Fear of Current or Ex-Partner: Not on file  . Emotionally Abused: Not on file  . Physically Abused: Not on file  . Sexually Abused: Not on file   Allergies as of 12/23/2019      Reactions   Amoxicillin Rash   Penicillins Rash      Medication List       Accurate as of December 23, 2019  1:40 PM. If you have any questions, ask your nurse or doctor.        amLODipine 10 MG tablet Commonly  known as: NORVASC Take 1 tablet (10 mg total) by mouth daily. Needs office visit prior to any additional refills   atorvastatin 40 MG tablet Commonly known as: LIPITOR Take 1 tablet (40 mg total) by mouth daily.   furosemide 20 MG tablet Commonly known as: LASIX Take 2 tablets (40 mg total) by mouth daily.   lisinopril 40 MG tablet Commonly known as: ZESTRIL Take 1 tablet (40 mg total) by mouth daily.   loratadine 10 MG tablet Commonly known as: CLARITIN Take 10 mg by mouth daily.   metFORMIN 500 MG tablet Commonly known as: GLUCOPHAGE Take 1 tablet (500 mg total) by mouth daily with breakfast.   spironolactone 25 MG tablet Commonly known as: ALDACTONE Take 2 tablets (50 mg total) by mouth daily. Needs office visit prior to any additional refills.       All past medical history, surgical history, allergies, family history, immunizations andmedications were updated in the EMR today and reviewed under the history and medication portions of their EMR.    Recent Results (from the past 2160 hour(s))  POCT glycosylated hemoglobin (Hb A1C)     Status: Abnormal   Collection Time: 12/23/19 11:17 AM  Result Value Ref Range   Hemoglobin A1C 5.9 (A) 4.0 - 5.6 %   HbA1c POC (<> result, manual entry) 5.9 4.0 - 5.6 %   HbA1c, POC (prediabetic range) 5.9 5.7 - 6.4 %   HbA1c, POC  (controlled diabetic range) 5.9 0.0 - 7.0 %    Patient was never admitted.   ROS: 14 pt review of systems performed and negative (unless mentioned in an HPI)  Objective: BP 127/82 (BP Location: Right Arm, Patient Position: Sitting, Cuff Size: Large)   Pulse 80   Temp 98.1 F (36.7 C) (Oral)   Ht 5' 10" (1.778 m)   Wt (!) 366 lb (166 kg)   SpO2 100%   BMI 52.52 kg/m   Gen: Afebrile. No acute distress. Obese pleasant male.  HENT: AT. [place].  Eyes:Pupils Equal Round Reactive to light, Extraocular movements intact,  Conjunctiva without redness, discharge or icterus. Neck/lymp/endocrine: Supple,no lymphadenopathy, no thyromegaly CV: RRR no murmru, no edema, +2/4 P posterior tibialis pulses Chest: CTAB, no wheeze or crackles Abd: Soft.. NTND. BS present.  Skin: no rashes, purpura or petechiae.  Neuro: Normal gait. PERLA. EOMi. Alert. Oriented x3 Psych: Normal affect, dress and demeanor. Normal speech. Normal thought content and judgment.   Assessment/plan: Darren Carlson is a 44 y.o. male present for establishment visit with  Essential hypertension/dyspnea on exertion/morbid obesity Acute combined systolic and diastolic heart failure ([place]) - looks great today. Reminded him to hydrate on 2 diuretics.  - Ejection fraction improved, return to normal. LVH, trace MR. - Thoracic aortic aneurysm --> repeat imaging felt he did not have an  aneurysm and original was artifact.  - continue  amlodipine 10 mg daily - continue  lisinopril 40 mg daily - continue Lasix 40 mg daily - continue  Aldactone 50 mg daily.   - Continue daily baby aspirin. - continue  atorvastatin 40 mg daily. - Continue follow-up per routine schedule with cardiology and cardiothoracic surgeon. - f/u 5 mos  diabetes/metabolic syndrome/morbid obesity: - Pt agreeable to start low dose metformin 500 QD A1c 6.4--> 6.3 --> 5.8-->6.8>6.3>6.8> 5.9  today.   Continue metformin QD - Continue to diet and exercise. - discussed  weight loss medication with him again last visit >>Contrave prescribed>> did take too expensive.  -EYE: 04/21/2019 Dr. Oswaldo Conroy -He declined  his pneumonia vaccines and his flu vaccine. -Foot exam completed today - f/u 5.5 months  Orders Placed This Encounter  Procedures  . Comp Met (CMET)  . Lipid panel  . CBC  . TSH  . POCT glycosylated hemoglobin (Hb A1C)   Meds ordered this encounter  Medications  . spironolactone (ALDACTONE) 25 MG tablet    Sig: Take 2 tablets (50 mg total) by mouth daily. Needs office visit prior to any additional refills.    Dispense:  180 tablet    Refill:  1  . metFORMIN (GLUCOPHAGE) 500 MG tablet    Sig: Take 1 tablet (500 mg total) by mouth daily with breakfast.    Dispense:  90 tablet    Refill:  1  . lisinopril (ZESTRIL) 40 MG tablet    Sig: Take 1 tablet (40 mg total) by mouth daily.    Dispense:  90 tablet    Refill:  1  . furosemide (LASIX) 20 MG tablet    Sig: Take 2 tablets (40 mg total) by mouth daily.    Dispense:  180 tablet    Refill:  1  . atorvastatin (LIPITOR) 40 MG tablet    Sig: Take 1 tablet (40 mg total) by mouth daily.    Dispense:  90 tablet    Refill:  1  . amLODipine (NORVASC) 10 MG tablet    Sig: Take 1 tablet (10 mg total) by mouth daily. Needs office visit prior to any additional refills    Dispense:  90 tablet    Refill:  1    Note is dictated utilizing voice recognition software. Although note has been proof read prior to signing, occasional typographical errors still can be missed. If any questions arise, please do not hesitate to call for verification.   Electronically signed by: Howard Pouch, DO [place]

## 2020-01-26 ENCOUNTER — Telehealth: Payer: Self-pay | Admitting: Family Medicine

## 2020-01-26 DIAGNOSIS — S52044A Nondisplaced fracture of coronoid process of right ulna, initial encounter for closed fracture: Secondary | ICD-10-CM | POA: Diagnosis not present

## 2020-01-26 NOTE — Telephone Encounter (Signed)
Please offer pt UC or sched with another provider. Sched is full for the day

## 2020-01-26 NOTE — Telephone Encounter (Signed)
Patient states this morning, he was pulling something heavy and he felt a pop inside his RT elbow area. Now, it is swollen and painful and he cannot lift anything. He would like to be seen in the 4:00 acute appt today. Please advise.

## 2020-01-26 NOTE — Telephone Encounter (Signed)
Spoke with patient who will decide if he will go to urgent care or call [facility].

## 2020-01-28 DIAGNOSIS — M25521 Pain in right elbow: Secondary | ICD-10-CM | POA: Diagnosis not present

## 2020-01-29 DIAGNOSIS — S56511A Strain of other extensor muscle, fascia and tendon at forearm level, right arm, initial encounter: Secondary | ICD-10-CM | POA: Diagnosis not present

## 2020-01-29 DIAGNOSIS — S46211A Strain of muscle, fascia and tendon of other parts of biceps, right arm, initial encounter: Secondary | ICD-10-CM | POA: Diagnosis not present

## 2020-01-29 DIAGNOSIS — M67931 Unspecified disorder of synovium and tendon, right forearm: Secondary | ICD-10-CM | POA: Diagnosis not present

## 2020-01-29 DIAGNOSIS — Q798 Other congenital malformations of musculoskeletal system: Secondary | ICD-10-CM | POA: Diagnosis not present

## 2020-02-02 DIAGNOSIS — S46211A Strain of muscle, fascia and tendon of other parts of biceps, right arm, initial encounter: Secondary | ICD-10-CM | POA: Diagnosis not present

## 2020-02-23 DIAGNOSIS — S46211D Strain of muscle, fascia and tendon of other parts of biceps, right arm, subsequent encounter: Secondary | ICD-10-CM | POA: Diagnosis not present

## 2020-03-01 DIAGNOSIS — S56211D Strain of other flexor muscle, fascia and tendon at forearm level, right arm, subsequent encounter: Secondary | ICD-10-CM | POA: Diagnosis not present

## 2020-03-01 DIAGNOSIS — S46111D Strain of muscle, fascia and tendon of long head of biceps, right arm, subsequent encounter: Secondary | ICD-10-CM | POA: Diagnosis not present

## 2020-03-01 DIAGNOSIS — S56511D Strain of other extensor muscle, fascia and tendon at forearm level, right arm, subsequent encounter: Secondary | ICD-10-CM | POA: Diagnosis not present

## 2020-03-01 DIAGNOSIS — M6281 Muscle weakness (generalized): Secondary | ICD-10-CM | POA: Diagnosis not present

## 2020-03-24 DIAGNOSIS — S46111D Strain of muscle, fascia and tendon of long head of biceps, right arm, subsequent encounter: Secondary | ICD-10-CM | POA: Diagnosis not present

## 2020-04-09 ENCOUNTER — Encounter: Payer: Self-pay | Admitting: Family Medicine

## 2020-06-02 DIAGNOSIS — S46111D Strain of muscle, fascia and tendon of long head of biceps, right arm, subsequent encounter: Secondary | ICD-10-CM | POA: Diagnosis not present

## 2020-07-07 ENCOUNTER — Other Ambulatory Visit: Payer: Self-pay | Admitting: Family Medicine

## 2020-07-07 DIAGNOSIS — I10 Essential (primary) hypertension: Secondary | ICD-10-CM

## 2020-09-10 ENCOUNTER — Other Ambulatory Visit: Payer: Self-pay | Admitting: Family Medicine

## 2020-09-10 DIAGNOSIS — R600 Localized edema: Secondary | ICD-10-CM

## 2020-09-16 ENCOUNTER — Other Ambulatory Visit: Payer: Self-pay

## 2020-09-16 DIAGNOSIS — I10 Essential (primary) hypertension: Secondary | ICD-10-CM

## 2020-09-21 ENCOUNTER — Other Ambulatory Visit: Payer: Self-pay

## 2020-09-21 DIAGNOSIS — I10 Essential (primary) hypertension: Secondary | ICD-10-CM

## 2020-09-23 DIAGNOSIS — M19072 Primary osteoarthritis, left ankle and foot: Secondary | ICD-10-CM | POA: Diagnosis not present

## 2020-09-23 DIAGNOSIS — M109 Gout, unspecified: Secondary | ICD-10-CM | POA: Diagnosis not present

## 2020-09-23 DIAGNOSIS — M67372 Transient synovitis, left ankle and foot: Secondary | ICD-10-CM | POA: Diagnosis not present

## 2020-09-27 ENCOUNTER — Encounter: Payer: Self-pay | Admitting: Family Medicine

## 2020-09-27 ENCOUNTER — Ambulatory Visit: Payer: BC Managed Care – PPO | Admitting: Family Medicine

## 2020-09-27 ENCOUNTER — Other Ambulatory Visit: Payer: Self-pay

## 2020-09-27 VITALS — BP 137/84 | HR 88 | Temp 97.7°F | Ht 70.0 in | Wt 386.0 lb

## 2020-09-27 DIAGNOSIS — M79672 Pain in left foot: Secondary | ICD-10-CM

## 2020-09-27 DIAGNOSIS — R600 Localized edema: Secondary | ICD-10-CM

## 2020-09-27 DIAGNOSIS — I10 Essential (primary) hypertension: Secondary | ICD-10-CM | POA: Diagnosis not present

## 2020-09-27 DIAGNOSIS — I5041 Acute combined systolic (congestive) and diastolic (congestive) heart failure: Secondary | ICD-10-CM

## 2020-09-27 DIAGNOSIS — E1169 Type 2 diabetes mellitus with other specified complication: Secondary | ICD-10-CM | POA: Diagnosis not present

## 2020-09-27 DIAGNOSIS — I420 Dilated cardiomyopathy: Secondary | ICD-10-CM

## 2020-09-27 DIAGNOSIS — Z6841 Body Mass Index (BMI) 40.0 and over, adult: Secondary | ICD-10-CM

## 2020-09-27 DIAGNOSIS — M67372 Transient synovitis, left ankle and foot: Secondary | ICD-10-CM | POA: Diagnosis not present

## 2020-09-27 DIAGNOSIS — E785 Hyperlipidemia, unspecified: Secondary | ICD-10-CM

## 2020-09-27 DIAGNOSIS — M7752 Other enthesopathy of left foot: Secondary | ICD-10-CM | POA: Diagnosis not present

## 2020-09-27 LAB — COMPREHENSIVE METABOLIC PANEL
ALT: 25 U/L (ref 0–53)
AST: 15 U/L (ref 0–37)
Albumin: 4.7 g/dL (ref 3.5–5.2)
Alkaline Phosphatase: 46 U/L (ref 39–117)
BUN: 19 mg/dL (ref 6–23)
CO2: 28 mEq/L (ref 19–32)
Calcium: 9.9 mg/dL (ref 8.4–10.5)
Chloride: 97 mEq/L (ref 96–112)
Creatinine, Ser: 1.19 mg/dL (ref 0.40–1.50)
GFR: 74.64 mL/min (ref >60.00)
Glucose, Bld: 183 mg/dL — ABNORMAL HIGH (ref 70–99)
Potassium: 4.4 mEq/L (ref 3.5–5.1)
Sodium: 135 mEq/L (ref 135–145)
Total Bilirubin: 1.2 mg/dL (ref 0.2–1.2)
Total Protein: 7.3 g/dL (ref 6.0–8.3)

## 2020-09-27 LAB — CBC WITH DIFFERENTIAL/PLATELET
Basophils Absolute: 0.1 10*3/uL (ref 0.0–0.1)
Basophils Relative: 0.9 % (ref 0.0–3.0)
Eosinophils Absolute: 0.2 10*3/uL (ref 0.0–0.7)
Eosinophils Relative: 2.6 % (ref 0.0–5.0)
HCT: 43 % (ref 39.0–52.0)
Hemoglobin: 14.8 g/dL (ref 13.0–17.0)
Lymphocytes Relative: 24.5 % (ref 12.0–46.0)
Lymphs Abs: 2.2 10*3/uL (ref 0.7–4.0)
MCHC: 34.3 g/dL (ref 30.0–36.0)
MCV: 83.3 fl (ref 78.0–100.0)
Monocytes Absolute: 0.7 10*3/uL (ref 0.1–1.0)
Monocytes Relative: 7.6 % (ref 3.0–12.0)
Neutro Abs: 5.8 10*3/uL (ref 1.4–7.7)
Neutrophils Relative %: 64.4 % (ref 43.0–77.0)
Platelets: 204 10*3/uL (ref 150.0–400.0)
RBC: 5.16 Mil/uL (ref 4.22–5.81)
RDW: 13.3 % (ref 11.5–15.5)
WBC: 9 10*3/uL (ref 4.0–10.5)

## 2020-09-27 LAB — POCT GLYCOSYLATED HEMOGLOBIN (HGB A1C)
HbA1c POC (<> result, manual entry): 7.8 % (ref 4.0–5.6)
HbA1c, POC (controlled diabetic range): 7.8 % — AB (ref 0.0–7.0)
HbA1c, POC (prediabetic range): 7.8 % — AB (ref 5.7–6.4)
Hemoglobin A1C: 7.8 % — AB (ref 4.0–5.6)

## 2020-09-27 LAB — LIPID PANEL
Cholesterol: 183 mg/dL (ref 0–200)
HDL: 47.7 mg/dL (ref >39.00)
LDL Cholesterol: 108 mg/dL — ABNORMAL HIGH (ref 0–99)
NonHDL: 135.56
Total CHOL/HDL Ratio: 4
Triglycerides: 140 mg/dL (ref 0.0–149.0)
VLDL: 28 mg/dL (ref 0.0–40.0)

## 2020-09-27 LAB — TSH: TSH: 1.81 u[IU]/mL (ref 0.35–4.50)

## 2020-09-27 LAB — URIC ACID: Uric Acid, Serum: 7.7 mg/dL (ref 4.0–7.8)

## 2020-09-27 MED ORDER — ATORVASTATIN CALCIUM 40 MG PO TABS: 40.0000 mg | ORAL_TABLET | Freq: Every day | ORAL | 1 refills | Status: DC

## 2020-09-27 MED ORDER — SPIRONOLACTONE 25 MG PO TABS: 50.0000 mg | ORAL_TABLET | Freq: Every day | ORAL | 1 refills | Status: DC

## 2020-09-27 MED ORDER — FUROSEMIDE 20 MG PO TABS: 40.0000 mg | ORAL_TABLET | Freq: Every day | ORAL | 1 refills | Status: DC

## 2020-09-27 MED ORDER — METFORMIN HCL 1000 MG PO TABS: 1000.0000 mg | ORAL_TABLET | Freq: Two times a day (BID) | ORAL | 1 refills | Status: DC

## 2020-09-27 MED ORDER — AMLODIPINE BESYLATE 10 MG PO TABS: 10.0000 mg | ORAL_TABLET | Freq: Every day | ORAL | 0 refills | Status: DC

## 2020-09-27 MED ORDER — LOSARTAN POTASSIUM 100 MG PO TABS: 100.0000 mg | ORAL_TABLET | Freq: Every day | ORAL | 1 refills | Status: DC

## 2020-09-27 NOTE — Progress Notes (Signed)
Patient ID: Kais Monje, male  DOB: Apr 29, 1975, 45 y.o.   MRN: [id number] Patient Care Team    Relationship Specialty Notifications Start End  Ma Hillock, DO PCP - General Family Medicine  01/23/17   Gaye Pollack, MD Consulting Physician Cardiothoracic Surgery  05/03/17   Park Liter, MD Consulting Physician Cardiology  05/03/17     Chief Complaint  Patient presents with   Diabetes    [facility]; pt is fasting    Subjective:Diezel Marczak is a 45 y.o. male present for Gaastra is a 45 y.o.  male present for follow-up on diabetes/metabolic syndrome/morbid obesity: Pt reports he has not been taking his metformin 500 mg daily denies numbness, tingling of extremities, hypo/hyperglycemic events or non-healing wounds.  PNA series: Declined Flu shot: Declined (recommneded yearly) Foot exam: Completed 09/27/2020 Eye exam: Encouraged yearly exam A1c: 5.9> 7.8 today  Gout: Patient reports he was having foot pain and went to see a podiatrist.  He was told that he had gout and an injection was provided in his foot.  He states since that time his symptoms have resolved.  He is not treated for gout at this time.  hypertension/hyperlipidemia/acute combined systolic and diastolic heart failure/cardiomyopathy Pt reports compliance amlodipine 10 mg daily,  lisinopril 40 mg daily, Lasix 40 mg daily and spirolactone 50 mg qd. Patient denies chest pain, shortness of breath, dizziness or lower extremity edema.  Pt takes a daily baby ASA.  Coreg discontinued and metoprolol XL discontinued (by pt) secondary to side effects. He has established with cardiology and CTS.  Thoracic aortic aneurysm 4.7 x 4.4 cm. --> on rpt image it is not felt he has an aneurysm. He is at the upper limits of normal. Original image felt to have artifact. Labs due Microalbumin:  on ace Diet: Watching diet very closely, low-sodium Exercise: Routine exercise RF: Hypertension, hyperlipidemia, obesity, dilated  cardiomyopathy, thoracic aortic aneurysm.  CTA 09/17/2017: CLINICAL DATA:  Follow-up of aneurysmal disease of the ascending thoracic aorta. EXAM: CT ANGIOGRAPHY CHEST WITH CONTRAST CONTRAST:  144m ISOVUE-370 IOPAMIDOL (ISOVUE-370) INJECTION 76% COMPARISON:  03/22/2017 IMPRESSION: Previous aortic dimensions are felt to be overestimated due to motion artifact. The ascending thoracic aorta actually measures 4 cm in greatest diameter, which is at the upper limits of normal.   CT anterior chest 03/12/2017: IMPRESSION: 1. Ascending thoracic aorta measures 4.7 x 4.4 cm.   2.  No demonstrable pulmonary embolus. 3. There is prominence of the main pulmonary outflow tract with a measured transverse diameter of 3.9 cm. This finding is concerning for underlying pulmonary arterial hypertension. 4.  No lung edema or consolidation. 5.  No evident thoracic adenopathy.  Echocardiogram 04/26/2017: LV EF: 55-65 % Study Conclusions - Left ventricle: The cavity size was normal. There was moderate   concentric hypertrophy. Systolic function was normal. The   estimated ejection fraction was in the range of 55% to 65%. Wall   motion was normal; there were no regional wall motion   abnormalities. The study is not technically sufficient to allow   evaluation of LV diastolic function. - Aortic valve: Valve area (VTI): 3.44 cm^2. Valve area (Vmax):   3.05 cm^2. Valve area (Vmean): 2.95 cm^2. Impressions: - Normal LVEF.   Moderate LVH.   Trace MR.  [facility] 09/27/2020 12/23/2019 06/24/2018 12/16/2017 01/23/2017  Decreased Interest 0 0 0 0 0  Down, Depressed, Hopeless 0 0 0 0 0  PHQ - 2 Score 0 0 0  0 0  Altered sleeping - - 0 - -  Tired, decreased energy - - 0 - -  Change in appetite - - 1 - -  Feeling bad or failure about yourself  - - 0 - -  Trouble concentrating - - 0 - -  Moving slowly or fidgety/restless - - 0 - -  Suicidal thoughts - - 0 - -  PHQ-9 Score - - 1 - -  Difficult  doing work/chores - - Not difficult at all - -    Past Medical History:  Diagnosis Date   Asthma    Environmental allergies    GERD (gastroesophageal reflux disease)    Hemorrhoids    Obesity    Allergies  Allergen Reactions   Amoxicillin Rash   Penicillins Rash   Past Surgical History:  Procedure Laterality Date   WISDOM TOOTH EXTRACTION     Family History  Problem Relation Age of Onset   Colon cancer Maternal Grandmother 64       early death   Diabetes Paternal Grandmother    Kidney disease Paternal Grandmother    Diabetes Paternal Grandfather    Kidney disease Paternal Grandfather    Colon cancer Maternal Aunt 35   Colon cancer Cousin    Stomach cancer Neg Hx    Pancreatic cancer Neg Hx    Esophageal cancer Neg Hx    Social History   Socioeconomic History   Marital status: Married    Spouse name: Terese   Number of children: 2   Years of education: 16   Highest education level: Not on file  Occupational History   Occupation: Regulatory affairs officer  Tobacco Use   Smoking status: Never   Smokeless tobacco: Never  Vaping Use   Vaping Use: Never used  Substance and Sexual Activity   Alcohol use: Yes    Comment: occasional   Drug use: No   Sexual activity: Yes    Partners: Female    Birth control/protection: None  Other Topics Concern   Not on file  Social History Narrative   Married to [place], 2 children.    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 Drinks caffeine.    Smoke alarm in the home. Wears seatbelt.    Feels safe in his relationships.    Social Determinants of Health   Financial Resource Strain: Not on file  Food Insecurity: Not on file  Transportation Needs: Not on file  Physical Activity: Not on file  Stress: Not on file  Social Connections: Not on file  Intimate Partner Violence: Not on file   Allergies as of 09/27/2020       Reactions   Amoxicillin Rash   Penicillins Rash        Medication List         Accurate as of September 27, 2020 12:49 PM. If you have any questions, ask your nurse or doctor.          STOP taking these medications    lisinopril 40 MG tablet Commonly known as: ZESTRIL Stopped by: Howard Pouch, DO       TAKE these medications    amLODipine 10 MG tablet Commonly known as: NORVASC Take 1 tablet (10 mg total) by mouth daily. What changed: additional instructions Changed by: Howard Pouch, DO   atorvastatin 40 MG tablet Commonly known as: LIPITOR Take 1 tablet (40 mg total) by mouth daily.   furosemide 20 MG tablet Commonly known as: LASIX Take  2 tablets (40 mg total) by mouth daily.   loratadine 10 MG tablet Commonly known as: CLARITIN Take 10 mg by mouth daily.   losartan 100 MG tablet Commonly known as: COZAAR Take 1 tablet (100 mg total) by mouth daily. Started by: Howard Pouch, DO   metFORMIN 1000 MG tablet Commonly known as: GLUCOPHAGE Take 1 tablet (1,000 mg total) by mouth 2 (two) times daily with a meal. What changed:  medication strength how much to take when to take this Changed by: Howard Pouch, DO   spironolactone 25 MG tablet Commonly known as: ALDACTONE Take 2 tablets (50 mg total) by mouth daily. Needs office visit prior to any additional refills.        All past medical history, surgical history, allergies, family history, immunizations andmedications were updated in the EMR today and reviewed under the history and medication portions of their EMR.    Recent Results (from the past 2160 hour(s))  POCT HgB A1C     Status: Abnormal   Collection Time: 09/27/20  9:50 AM  Result Value Ref Range   Hemoglobin A1C 7.8 (A) 4.0 - 5.6 %   HbA1c POC (<> result, manual entry) 7.8 4.0 - 5.6 %   HbA1c, POC (prediabetic range) 7.8 (A) 5.7 - 6.4 %   HbA1c, POC (controlled diabetic range) 7.8 (A) 0.0 - 7.0 %     Patient was never admitted.   ROS: 14 pt review of systems performed and negative (unless mentioned in an  HPI)  Objective: BP 137/84   Pulse 88   Temp 97.7 F (36.5 C) (Oral)   Ht '5\' 10"'  (1.778 m)   Wt (!) 386 lb (175.1 kg)   SpO2 99%   BMI 55.39 kg/m   Gen: Afebrile. No acute distress. Morbid obesity, pleasant male.  HENT: AT. [place]. no cough or hoarseness.  Eyes:Pupils Equal Round Reactive to light, Extraocular movements intact,  Conjunctiva without redness, discharge or icterus. Neck/lymp/endocrine: Supple,no lymphadenopathy, no thyromegaly CV: RRR no murmur, no edema, +2/4 P posterior tibialis pulses Chest: CTAB, no wheeze or crackles Skin: no rashes, purpura or petechiae.  Neuro:Normal gait. PERLA. EOMi. Alert. Oriented x3 Psych: Normal affect, dress and demeanor. Normal speech. Normal thought content and judgment.    Assessment/plan: Shemar Plemmons is a 45 y.o. male present for establishment visit with  Essential hypertension/dyspnea on exertion/morbid obesity Acute combined systolic and diastolic heart failure (HCC) Stable, could use tighter control. - Ejection fraction improved, return to normal. LVH, trace MR. - Thoracic aortic aneurysm --> repeat imaging felt he did not have an  aneurysm and original was artifact.  -Continue amlodipine 10 mg daily -Discontinue lisinopril 40 mg daily> start losartan 100 mg daily (gout) -Continue Lasix 40 mg daily -Continue Aldactone 50 mg daily.   - Continue daily baby aspirin. -Continue atorvastatin 40 mg daily. - Continue follow-up per routine schedule with cardiology and cardiothoracic surgeon. -CBC, CMP, TSH and lipids collected today. - f/u 5 mos  diabetes/metabolic syndrome/morbid obesity: -Increase metformin to 1000 mg twice daily.  Offered Ozempic once week injection which would provide him cardiovascular protection, help him lose weight and cover diabetes and he declined at this time. A1c 6.4--> 6.3 --> 5.8-->6.8>6.3>6.8> 5.9> 7.8 today.   Continue metformin QD - Continue to diet and exercise. -EYE: 04/21/2019 Dr. Oswaldo Conroy encouraged  yearly eye exams -He declined his pneumonia vaccines and his flu vaccine. -Foot exam completed 09/27/2020 - f/u 5.5 months  Foot pain-possible gout: Diagnosed by podiatry.  Seem to  respond well to steroid injection to joint by podiatry.  We will collect uric acid today. Encouraged low purine diet. Switched lisinopril to losartan. At high risk since he is on 2 diuretics. Discussed allopurinol daily dosing and he does not desire to be on a daily medication at this time.  Orders Placed This Encounter  Procedures   TSH   Lipid panel   Comp Met (CMET)   CBC w/Diff   Uric acid   POCT HgB A1C   Meds ordered this encounter  Medications   amLODipine (NORVASC) 10 MG tablet    Sig: Take 1 tablet (10 mg total) by mouth daily.    Dispense:  901 tablet    Refill:  0   atorvastatin (LIPITOR) 40 MG tablet    Sig: Take 1 tablet (40 mg total) by mouth daily.    Dispense:  90 tablet    Refill:  1   furosemide (LASIX) 20 MG tablet    Sig: Take 2 tablets (40 mg total) by mouth daily.    Dispense:  180 tablet    Refill:  1   spironolactone (ALDACTONE) 25 MG tablet    Sig: Take 2 tablets (50 mg total) by mouth daily. Needs office visit prior to any additional refills.    Dispense:  180 tablet    Refill:  1   metFORMIN (GLUCOPHAGE) 1000 MG tablet    Sig: Take 1 tablet (1,000 mg total) by mouth 2 (two) times daily with a meal.    Dispense:  180 tablet    Refill:  1   losartan (COZAAR) 100 MG tablet    Sig: Take 1 tablet (100 mg total) by mouth daily.    Dispense:  90 tablet    Refill:  1     Note is dictated utilizing voice recognition software. Although note has been proof read prior to signing, occasional typographical errors still can be missed. If any questions arise, please do not hesitate to call for verification.   Electronically signed by: Howard Pouch, DO [place]

## 2020-09-27 NOTE — Patient Instructions (Addendum)
Low-Purine Eating Plan A low-purine eating plan involves making food choices to limit your intake of purine. Purine is a kind of uric acid. Too much uric acid in your blood can cause certain conditions, such as gout and kidney stones. Eating a low-purinediet can help control these conditions. What are tips for following this plan? Reading food labels Avoid foods with saturated or Trans fat. Check the ingredient list of grains-based foods, such as bread and cereal, to make sure that they contain whole grains. Check the ingredient list of sauces or soups to make sure they do not contain meat or fish. When choosing soft drinks, check the ingredient list to make sure they do not contain high-fructose corn syrup. Shopping  Buy plenty of fresh fruits and vegetables. Avoid buying canned or fresh fish. Buy dairy products labeled as low-fat or nonfat. Avoid buying premade or processed foods. These foods are often high in fat, salt (sodium), and added sugar.  Cooking Use olive oil instead of butter when cooking. Oils like olive oil, canola oil, and sunflower oil contain healthy fats. Meal planning Learn which foods do or do not affect you. If you find out that a food tends to cause your gout symptoms to flare up, avoid eating that food. You can enjoy foods that do not cause problems. If you have any questions about a food item, talk with your dietitian or health care provider. Limit foods high in fat, especially saturated fat. Fat makes it harder for your body to get rid of uric acid. Choose foods that are lower in fat and are lean sources of protein. General guidelines Limit alcohol intake to no more than 1 drink a day for nonpregnant women and 2 drinks a day for men. One drink equals 12 oz of beer, 5 oz of wine, or 1 oz of hard liquor. Alcohol can affect the way your body gets rid of uric acid. Drink plenty of water to keep your urine clear or pale yellow. Fluids can help remove uric acid from  your body. If directed by your health care provider, take a vitamin C supplement. Work with your health care provider and dietitian to develop a plan to achieve or maintain a healthy weight. Losing weight can help reduce uric acid in your blood. What foods are recommended? The items listed may not be a complete list. Talk with your dietitian aboutwhat dietary choices are best for you. Foods low in purines Foods low in purines do not need to be limited. These include: All fruits. All low-purine vegetables, pickles, and olives. Breads, pasta, rice, cornbread, and popcorn. Cake and other baked goods. All dairy foods. Eggs, nuts, and nut butters. Spices and condiments, such as salt, herbs, and vinegar. Plant oils, butter, and margarine. Water, sugar-free soft drinks, tea, coffee, and cocoa. Vegetable-based soups, broths, sauces, and gravies. Foods moderate in purines Foods moderate in purines should be limited to the amounts listed.  cup of asparagus, cauliflower, spinach, mushrooms, or green peas, each day. 2/3 cup uncooked oatmeal, each day.  cup dry wheat bran or wheat germ, each day. 2-3 ounces of meat or poultry, each day. 4-6 ounces of shellfish, such as crab, lobster, oysters, or shrimp, each day. 1 cup cooked beans, peas, or lentils, each day. Soup, broths, or bouillon made from meat or fish. Limit these foods as much as possible. What foods are not recommended? The items listed may not be a complete list. Talk with your dietitian aboutwhat dietary choices are best  for you. Limit your intake of foods high in purines, including: Beer and other alcohol. Meat-based gravy or sauce. Canned or fresh fish, such as: Anchovies, sardines, herring, and tuna. Mussels and scallops. Codfish, trout, and haddock. Tomasa Blase. Organ meats, such as: Liver or kidney. Tripe. Sweetbreads (thymus gland or pancreas). Wild Education officer, environmental. Yeast or yeast extract supplements. Drinks sweetened with  high-fructose corn syrup. Summary Eating a low-purine diet can help control conditions caused by too much uric acid in the body, such as gout or kidney stones. Choose low-purine foods, limit alcohol, and limit foods high in fat. You will learn over time which foods do or do not affect you. If you find out that a food tends to cause your gout symptoms to flare up, avoid eating that food. This information is not intended to replace advice given to you by your health care provider. Make sure you discuss any questions you have with your healthcare provider. Document Revised: 07/09/2019 Document Reviewed: 07/09/2019 Elsevier Patient Education  2022 Elsevier Inc. Diabetes Mellitus and Exercise Exercising regularly is important for overall health, especially for people who have diabetes mellitus. Exercising is not only about losing weight. It has many other health benefits, such as increasing muscle strength and bone density and reducing body fat and stress. This leads to improved fitness, flexibility, andendurance, all of which result in better overall health. What are the benefits of exercise if I have diabetes? Exercise has many benefits for people with diabetes. They include: Helping to lower and control blood sugar (glucose). Helping the body to respond better to the hormone insulin by improving insulin sensitivity. Reducing how much insulin the body needs. Lowering the risk for heart disease by: Lowering "bad" cholesterol and triglyceride levels. Increasing "good" cholesterol levels. Lowering blood pressure. Lowering blood glucose levels. What is my activity plan? Your health care provider or certified diabetes educator can help you make a plan for the type and frequency of exercise that works for you. This is called your activity plan. Be sure to: Get at least 150 minutes of medium-intensity or high-intensity exercise each week. Exercises may include brisk walking, biking, or water  aerobics. Do stretching and strengthening exercises, such as yoga or weight lifting, at least 2 times a week. Spread out your activity over at least 3 days of the week. Get some form of physical activity each day. Do not go more than 2 days in a row without some kind of physical activity. Avoid being inactive for more than 90 minutes at a time. Take frequent breaks to walk or stretch. Choose exercises or activities that you enjoy. Set realistic goals. Start slowly and gradually increase your exercise intensity over time. How do I manage my diabetes during exercise?  Monitor your blood glucose Check your blood glucose before and after exercising. If your blood glucose is: 240 mg/dL (38.1 mmol/L) or higher before you exercise, check your urine for ketones. These are chemicals created by the liver. If you have ketones in your urine, do not exercise until your blood glucose returns to normal. 100 mg/dL (5.6 mmol/L) or lower, eat a snack containing 15-20 grams of carbohydrate. Check your blood glucose 15 minutes after the snack to make sure that your glucose level is above 100 mg/dL (5.6 mmol/L) before you start your exercise. Know the symptoms of low blood glucose (hypoglycemia) and how to treat it. Your risk for hypoglycemia increases during and after exercise. Follow these tips and your health care provider's instructions Keep a carbohydrate  snack that is fast-acting for use before, during, and after exercise to help prevent or treat hypoglycemia. Avoid injecting insulin into areas of the body that are going to be exercised. For example, avoid injecting insulin into: Your arms, when you are about to play tennis. Your legs, when you are about to go jogging. Keep records of your exercise habits. Doing this can help you and your health care provider adjust your diabetes management plan as needed. Write down: Food that you eat before and after you exercise. Blood glucose levels before and after you  exercise. The type and amount of exercise you have done. Work with your health care provider when you start a new exercise or activity. He or she may need to: Make sure that the activity is safe for you. Adjust your insulin, other medicines, and food that you eat. Drink plenty of water while you exercise. This prevents loss of water (dehydration) and problems caused by a lot of heat in the body (heat stroke). Where to find more information American Diabetes Association: www.diabetes.org Summary Exercising regularly is important for overall health, especially for people who have diabetes mellitus. Exercising has many health benefits. It increases muscle strength and bone density and reduces body fat and stress. It also lowers and controls blood glucose. Your health care provider or certified diabetes educator can help you make an activity plan for the type and frequency of exercise that works for you. Work with your health care provider to make sure any new activity is safe for you. Also work with your health care provider to adjust your insulin, other medicines, and the food you eat. This information is not intended to replace advice given to you by your health care provider. Make sure you discuss any questions you have with your healthcare provider. Document Revised: 12/22/2018 Document Reviewed: 12/22/2018 Elsevier Patient Education  2022 [employer].

## 2020-09-28 ENCOUNTER — Telehealth: Payer: Self-pay | Admitting: Family Medicine

## 2020-09-28 MED ORDER — ATORVASTATIN CALCIUM 80 MG PO TABS: 80.0000 mg | ORAL_TABLET | Freq: Every day | ORAL | 3 refills | Status: DC

## 2020-09-28 NOTE — Telephone Encounter (Signed)
Please call patient Darren Carlson, kidney and thyroid function are normal Blood cell counts and electrolytes are normal-with the exception of a elevated glucose of 183.  Cholesterol panel above goal for him.  His LDL goal is less than 70.  Currently his LDL is 108.  I have called in an increased dose of his atorvastatin for him to start.  New doses atorvastatin 80 mg in the evening.  If he has atorvastatin left over from prior prescription he can continue by taking 2 of the atorvastatin 40 mg tabs until bottle is complete.  We will recheck his cholesterol again to make sure he is at goal at his next follow-up appointment in 3-4 months.

## 2020-09-28 NOTE — Telephone Encounter (Signed)
LM for pt to return call to discuss.  

## 2020-09-29 NOTE — Telephone Encounter (Signed)
LVM for pt to CB regarding results.  

## 2020-09-30 NOTE — Telephone Encounter (Signed)
Spoke with pt regarding labs and instructions.   

## 2020-11-08 ENCOUNTER — Other Ambulatory Visit: Payer: Self-pay | Admitting: *Deleted

## 2020-11-08 DIAGNOSIS — I712 Thoracic aortic aneurysm, without rupture, unspecified: Secondary | ICD-10-CM

## 2020-11-10 ENCOUNTER — Encounter: Payer: Self-pay | Admitting: Family Medicine

## 2020-11-11 NOTE — Telephone Encounter (Signed)
Please advise 

## 2021-01-04 ENCOUNTER — Ambulatory Visit: Payer: BC Managed Care – PPO | Admitting: Surgery

## 2021-01-04 ENCOUNTER — Ambulatory Visit
Admission: RE | Admit: 2021-01-04 | Discharge: 2021-01-04 | Disposition: A | Discharge status: Home / Self Care | Payer: BC Managed Care – PPO | Source: Ambulatory Visit | Attending: Surgery | Admitting: Surgery

## 2021-01-04 DIAGNOSIS — I712 Thoracic aortic aneurysm, without rupture, unspecified: Secondary | ICD-10-CM

## 2021-01-04 DIAGNOSIS — R911 Solitary pulmonary nodule: Secondary | ICD-10-CM | POA: Diagnosis not present

## 2021-01-04 MED ORDER — IOPAMIDOL (ISOVUE-370) INJECTION 76%
75.0000 mL | Freq: Once | INTRAVENOUS | Status: AC | PRN
  Administered 2021-01-04: 75 mL via INTRAVENOUS

## 2021-01-05 ENCOUNTER — Ambulatory Visit: Payer: BC Managed Care – PPO | Admitting: Surgical

## 2021-01-05 ENCOUNTER — Other Ambulatory Visit: Payer: Self-pay

## 2021-01-05 VITALS — BP 134/87 | HR 68 | Resp 20 | Ht 70.0 in | Wt 355.0 lb

## 2021-01-05 DIAGNOSIS — I712 Thoracic aortic aneurysm, without rupture, unspecified: Secondary | ICD-10-CM

## 2021-01-05 NOTE — Patient Instructions (Signed)
Continue healthy lifestyle and nutrition choices.

## 2021-01-05 NOTE — Progress Notes (Signed)
Subjective:    Patient ID: Darren Carlson, male    DOB: 03/13/1976, 45 y.o.   MRN: [id number]  Chief Complaint  Patient presents with   Thoracic Aortic Aneurysm    2 year f/u with CTA Chest    HPI Patient is in today for follow-up on aneurysm surveillance.  He has been followed since 2018 when an original study showed a aortic a sending thoracic aneurysm.  This study in 2020 showed that that may have been an error due to motion artifact.  The study in 2020 did not show findings consistent with aneurysmal dilatation.  The patient reports overall he is feeling well.  He has made significant lifestyle modification in terms of diet and activity.  He reports that he has had significant weight loss but is still in the obese range.  He also has hypertension.  Past Medical History:  Diagnosis Date   Asthma    Environmental allergies    GERD (gastroesophageal reflux disease)    Hemorrhoids    Obesity     Past Surgical History:  Procedure Laterality Date   WISDOM TOOTH EXTRACTION      Family History  Problem Relation Age of Onset   Colon cancer Maternal Grandmother 109       early death   Diabetes Paternal Grandmother    Kidney disease Paternal Grandmother    Diabetes Paternal Grandfather    Kidney disease Paternal Grandfather    Colon cancer Maternal Aunt 35   Colon cancer Cousin    Stomach cancer Neg Hx    Pancreatic cancer Neg Hx    Esophageal cancer Neg Hx     Social History   Socioeconomic History   Marital status: Married    Spouse name: Terese   Number of children: 2   Years of education: 16   Highest education level: Not on file  Occupational History   Occupation: Sports administrator  Tobacco Use   Smoking status: Never   Smokeless tobacco: Never  Vaping Use   Vaping Use: Never used  Substance and Sexual Activity   Alcohol use: Yes    Comment: occasional   Drug use: No   Sexual activity: Yes    Partners: Female    Birth control/protection: None  Other  Topics Concern   Not on file  Social History Narrative   Married to [place], 2 children.    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 Drinks caffeine.    Smoke alarm in the home. Wears seatbelt.    Feels safe in his relationships.    Social Determinants of Health   Financial Resource Strain: Not on file  Food Insecurity: Not on file  Transportation Needs: Not on file  Physical Activity: Not on file  Stress: Not on file  Social Connections: Not on file  Intimate Partner Violence: Not on file    Outpatient Medications Prior to Visit  Medication Sig Dispense Refill   amLODipine (NORVASC) 10 MG tablet Take 1 tablet (10 mg total) by mouth daily. 901 tablet 0   atorvastatin (LIPITOR) 80 MG tablet Take 1 tablet (80 mg total) by mouth daily. 90 tablet 3   furosemide (LASIX) 20 MG tablet Take 2 tablets (40 mg total) by mouth daily. 180 tablet 1   loratadine (CLARITIN) 10 MG tablet Take 10 mg by mouth daily.     losartan (COZAAR) 100 MG tablet Take 1 tablet (100 mg total) by mouth daily. 90 tablet 1  metFORMIN (GLUCOPHAGE) 1000 MG tablet Take 1 tablet (1,000 mg total) by mouth 2 (two) times daily with a meal. 180 tablet 1   spironolactone (ALDACTONE) 25 MG tablet Take 2 tablets (50 mg total) by mouth daily. Needs office visit prior to any additional refills. 180 tablet 1   No facility-administered medications prior to visit.    Allergies  Allergen Reactions   Amoxicillin Rash   Penicillins Rash        Objective:     PE General obese male in no acute distress Lungs clear lung fields Cardiac regular rate and rhythm without murmurs gallops or rub Abdomen obese Extrem lower extremity venous stasis changes with no current edema     Ht 5\' 10"  (1.778 m)   BMI 55.39 kg/m  Wt Readings from Last 3 Encounters:  09/27/20 (!) 386 lb (175.1 kg)  12/23/19 (!) 366 lb (166 kg)  08/14/19 (!) 373 lb 8 oz (169.4 kg)    Health Maintenance Due  Topic  Date Due   OPHTHALMOLOGY EXAM  04/20/2020   COVID-19 Vaccine (4 - Booster for Pfizer series) 04/27/2020   INFLUENZA VACCINE  Never done    There are no preventive care reminders to display for this patient.   Lab Results  Component Value Date   TSH 1.81 09/27/2020   Lab Results  Component Value Date   WBC 9.0 09/27/2020   HGB 14.8 09/27/2020   HCT 43.0 09/27/2020   MCV 83.3 09/27/2020   PLT 204.0 09/27/2020   Lab Results  Component Value Date   NA 135 09/27/2020   K 4.4 09/27/2020   CO2 28 09/27/2020   GLUCOSE 183 (H) 09/27/2020   BUN 19 09/27/2020   CREATININE 1.19 09/27/2020   BILITOT 1.2 09/27/2020   ALKPHOS 46 09/27/2020   AST 15 09/27/2020   ALT 25 09/27/2020   PROT 7.3 09/27/2020   ALBUMIN 4.7 09/27/2020   CALCIUM 9.9 09/27/2020   GFR 74.64 09/27/2020   Lab Results  Component Value Date   CHOL 183 09/27/2020   Lab Results  Component Value Date   HDL 47.70 09/27/2020   Lab Results  Component Value Date   LDLCALC 108 (H) 09/27/2020   Lab Results  Component Value Date   TRIG 140.0 09/27/2020   Lab Results  Component Value Date   CHOLHDL 4 09/27/2020   Lab Results  Component Value Date   HGBA1C 7.8 (A) 09/27/2020   HGBA1C 7.8 09/27/2020   HGBA1C 7.8 (A) 09/27/2020   HGBA1C 7.8 (A) 09/27/2020       Assessment & Plan:   Problem List Items Addressed This Visit   None Visit Diagnoses     Thoracic aortic aneurysm without rupture    -  Primary      CT ANGIO CHEST AORTA W/CM & OR WO/CM  Result Date: 01/04/2021 CLINICAL DATA:  Follow-up thoracic aortic aneurysm EXAM: CT ANGIOGRAPHY CHEST WITH CONTRAST TECHNIQUE: Multidetector CT imaging of the chest was performed using the standard protocol during bolus administration of intravenous contrast. Multiplanar CT image reconstructions and MIPs were obtained to evaluate the vascular anatomy. CONTRAST:  36mL ISOVUE-370 IOPAMIDOL (ISOVUE-370) INJECTION 76% COMPARISON:  12/31/2018 FINDINGS:  Cardiovascular: No evidence of thoracic aortic aneurysm. Diameter at the sinuses of Valsalva 3.5 cm. Maximum diameter is in the ascending thoracic aorta measuring 3.8 cm. Heart is normal size. Mediastinum/Nodes: No mediastinal, hilar, or axillary adenopathy. Trachea and esophagus are unremarkable. Thyroid unremarkable. Lungs/Pleura: Lungs are clear. No focal airspace opacities or suspicious  nodules. No effusions. 4 mm left lower lobe pulmonary nodule again noted, unchanged compatible with a benign nodule. Upper Abdomen: Imaging into the upper abdomen demonstrates no acute findings. Musculoskeletal: Chest wall soft tissues are unremarkable. No acute bony abnormality. Review of the MIP images confirms the above findings. IMPRESSION: No evidence of thoracic aortic aneurysm. Maximum aortic diameter 3.8 cm in the ascending thoracic aorta. No acute cardiopulmonary disease. Electronically Signed   By: Charlett Nose M.D.   On: 01/04/2021 12:30      No orders of the defined types were placed in this encounter.  Today study as described above showing maximum aortic diameter to 3.8 cm.  This is not consistent with thoracic aneurysm.  We did discuss continuing to maintain a healthy lifestyle and nutrition as he remains at risk for cardiovascular sequela I due to obesity and hypertension.  He understands the importance of this.  I discussed this with Dr. Laneta Simmers and he feels as though he does not require ongoing CTA chest surveillance at this time.   Rowe Clack, PA-C

## 2021-01-25 ENCOUNTER — Ambulatory Visit: Payer: BC Managed Care – PPO | Admitting: Family Medicine

## 2021-02-02 ENCOUNTER — Encounter: Payer: Self-pay | Admitting: Family Medicine

## 2021-02-02 ENCOUNTER — Other Ambulatory Visit: Payer: Self-pay

## 2021-02-02 ENCOUNTER — Ambulatory Visit: Payer: BC Managed Care – PPO | Admitting: Family Medicine

## 2021-02-02 VITALS — BP 116/76 | HR 73 | Temp 98.0°F | Ht 70.0 in | Wt 347.0 lb

## 2021-02-02 DIAGNOSIS — R600 Localized edema: Secondary | ICD-10-CM

## 2021-02-02 DIAGNOSIS — Z6841 Body Mass Index (BMI) 40.0 and over, adult: Secondary | ICD-10-CM

## 2021-02-02 DIAGNOSIS — E1169 Type 2 diabetes mellitus with other specified complication: Secondary | ICD-10-CM | POA: Diagnosis not present

## 2021-02-02 DIAGNOSIS — E785 Hyperlipidemia, unspecified: Secondary | ICD-10-CM

## 2021-02-02 DIAGNOSIS — I420 Dilated cardiomyopathy: Secondary | ICD-10-CM

## 2021-02-02 DIAGNOSIS — I10 Essential (primary) hypertension: Secondary | ICD-10-CM

## 2021-02-02 DIAGNOSIS — I5041 Acute combined systolic (congestive) and diastolic (congestive) heart failure: Secondary | ICD-10-CM

## 2021-02-02 LAB — POCT GLYCOSYLATED HEMOGLOBIN (HGB A1C)
HbA1c POC (<> result, manual entry): 5.7 % (ref 4.0–5.6)
HbA1c, POC (controlled diabetic range): 5.7 % (ref 0.0–7.0)
HbA1c, POC (prediabetic range): 5.7 % (ref 5.7–6.4)
Hemoglobin A1C: 5.7 % — AB (ref 4.0–5.6)

## 2021-02-02 LAB — LIPID PANEL
Cholesterol: 155 mg/dL (ref 0–200)
HDL: 45 mg/dL (ref >39.00)
LDL Cholesterol: 89 mg/dL (ref 0–99)
NonHDL: 109.91
Total CHOL/HDL Ratio: 3
Triglycerides: 107 mg/dL (ref 0.0–149.0)
VLDL: 21.4 mg/dL (ref 0.0–40.0)

## 2021-02-02 MED ORDER — LOSARTAN POTASSIUM 100 MG PO TABS: 100.0000 mg | ORAL_TABLET | Freq: Every day | ORAL | 1 refills | Status: DC

## 2021-02-02 MED ORDER — METFORMIN HCL 1000 MG PO TABS: 1000.0000 mg | ORAL_TABLET | Freq: Every day | ORAL | 1 refills | Status: DC

## 2021-02-02 MED ORDER — SPIRONOLACTONE 25 MG PO TABS: 50.0000 mg | ORAL_TABLET | Freq: Every day | ORAL | 1 refills | Status: DC

## 2021-02-02 MED ORDER — FUROSEMIDE 20 MG PO TABS: 40.0000 mg | ORAL_TABLET | Freq: Every day | ORAL | 1 refills | Status: DC

## 2021-02-02 MED ORDER — AMLODIPINE BESYLATE 5 MG PO TABS: 5.0000 mg | ORAL_TABLET | Freq: Every day | ORAL | 1 refills | Status: DC

## 2021-02-02 NOTE — Progress Notes (Signed)
Patient ID: Darren Carlson, male  DOB: September 15, 1975, 45 y.o.   MRN: [id number] Patient Care Team    Relationship Specialty Notifications Start End  Natalia Leatherwood, DO PCP - General Family Medicine  01/23/17   Alleen Borne, MD Consulting Physician Cardiothoracic Surgery  05/03/17   Georgeanna Lea, MD Consulting Physician Cardiology  05/03/17     Chief Complaint  Patient presents with   Diabetes    [facility]; pt is not fasting    Subjective:Darren Carlson is a 45 y.o. male present for [facility] Haik Mahoney is a 45 y.o.  male present for follow-up on diabetes/metabolic syndrome/morbid obesity: Pt reports compliance  metformin 500 mg daily. He had not increased as we discussed. He has been working very hard on his diet and exercise. He has lost weight and feeling better. Patient denies dizziness, hyperglycemic or hypoglycemic events. Patient denies numbness, tingling in the extremities or nonhealing wounds of feet.   hypertension/hyperlipidemia/acute combined systolic and diastolic heart failure/cardiomyopathy Pt reports compliance amlodipine 5 mg daily,  lisinopril 40 mg daily, Lasix 40 mg daily and spirolactone 50 mg qd. Patient denies chest pain, shortness of breath, dizziness or lower extremity edema.  Coreg discontinued and metoprolol XL discontinued (by pt) secondary to side effects. He has established with cardiology and CTS. patient does not have a thoracic aortic aneurysm.  Repeat imaging rule this out. Microalbumin:  on ace Diet: Watching diet very closely, low-sodium Exercise: Routine exercise RF: Hypertension, hyperlipidemia, obesity, dilated cardiomyopathy, thoracic aortic aneurysm.  CTA 09/17/2017: CLINICAL DATA:  Follow-up of aneurysmal disease of the ascending thoracic aorta. EXAM: CT ANGIOGRAPHY CHEST WITH CONTRAST CONTRAST:  ISOVUE-370 IOPAMIDOL (ISOVUE-370) INJECTION 76% COMPARISON:  03/22/2017 IMPRESSION: Previous aortic dimensions are felt to be overestimated due  to motion artifact. The ascending thoracic aorta actually measures 4 cm in greatest diameter, which is at the upper limits of normal.   CT anterior chest 03/12/2017: IMPRESSION: 1. Ascending thoracic aorta measures 4.7 x 4.4 cm.   2.  No demonstrable pulmonary embolus. 3. There is prominence of the main pulmonary outflow tract with a measured transverse diameter of 3.9 cm. This finding is concerning for underlying pulmonary arterial hypertension. 4.  No lung edema or consolidation. 5.  No evident thoracic adenopathy.  Echocardiogram 04/26/2017: LV EF: 55-65 % Study Conclusions - Left ventricle: The cavity size was normal. There was moderate   concentric hypertrophy. Systolic function was normal. The   estimated ejection fraction was in the range of 55% to 65%. Wall   motion was normal; there were no regional wall motion   abnormalities. The study is not technically sufficient to allow   evaluation of LV diastolic function. - Aortic valve: Valve area (VTI): 3.44 cm^2. Valve area (Vmax):   3.05 cm^2. Valve area (Vmean): 2.95 cm^2. Impressions: - Normal LVEF.   Moderate LVH.   Trace MR.  Depression screen [facility] 2/9 02/02/2021 09/27/2020 12/23/2019 06/24/2018 12/16/2017  Decreased Interest 0 0 0 0 0  Down, Depressed, Hopeless 0 0 0 0 0  PHQ - 2 Score 0 0 0 0 0  Altered sleeping - - - 0 -  Tired, decreased energy - - - 0 -  Change in appetite - - - 1 -  Feeling bad or failure about yourself  - - - 0 -  Trouble concentrating - - - 0 -  Moving slowly or fidgety/restless - - - 0 -  Suicidal thoughts - - - 0 -  PHQ-9 Score - - -  1 -  Difficult doing work/chores - - - Not difficult at all -    Past Medical History:  Diagnosis Date   Asthma    Environmental allergies    GERD (gastroesophageal reflux disease)    Hemorrhoids    Obesity    Allergies  Allergen Reactions   Amoxicillin Rash   Penicillins Rash   Past Surgical History:  Procedure Laterality Date   WISDOM TOOTH  EXTRACTION     Family History  Problem Relation Age of Onset   Colon cancer Maternal Grandmother 74       early death   Diabetes Paternal Grandmother    Kidney disease Paternal Grandmother    Diabetes Paternal Grandfather    Kidney disease Paternal Grandfather    Colon cancer Maternal Aunt 35   Colon cancer Cousin    Stomach cancer Neg Hx    Pancreatic cancer Neg Hx    Esophageal cancer Neg Hx    Social History   Socioeconomic History   Marital status: Married    Spouse name: Terese   Number of children: 2   Years of education: 16   Highest education level: Not on file  Occupational History   Occupation: Sports administrator  Tobacco Use   Smoking status: Never   Smokeless tobacco: Never  Vaping Use   Vaping Use: Never used  Substance and Sexual Activity   Alcohol use: Yes    Comment: occasional   Drug use: No   Sexual activity: Yes    Partners: Female    Birth control/protection: None  Other Topics Concern   Not on file  Social History Narrative   Married to [place], 2 children.    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 Drinks caffeine.    Smoke alarm in the home. Wears seatbelt.    Feels safe in his relationships.    Social Determinants of Health   Financial Resource Strain: Not on file  Food Insecurity: Not on file  Transportation Needs: Not on file  Physical Activity: Not on file  Stress: Not on file  Social Connections: Not on file  Intimate Partner Violence: Not on file   Allergies as of 02/02/2021       Reactions   Amoxicillin Rash   Penicillins Rash        Medication List        Accurate as of February 02, 2021 12:55 PM. If you have any questions, ask your nurse or doctor.          amLODipine 5 MG tablet Commonly known as: NORVASC Take 1 tablet (5 mg total) by mouth daily. What changed:  medication strength how much to take Changed by: Felix Pacini, DO   atorvastatin 80 MG tablet Commonly known  as: LIPITOR Take 1 tablet (80 mg total) by mouth daily.   furosemide 20 MG tablet Commonly known as: LASIX Take 2 tablets (40 mg total) by mouth daily.   loratadine 10 MG tablet Commonly known as: CLARITIN Take 10 mg by mouth daily.   losartan 100 MG tablet Commonly known as: COZAAR Take 1 tablet (100 mg total) by mouth daily.   metFORMIN 1000 MG tablet Commonly known as: GLUCOPHAGE Take 1 tablet (1,000 mg total) by mouth daily with breakfast. What changed: when to take this Changed by: Felix Pacini, DO   spironolactone 25 MG tablet Commonly known as: ALDACTONE Take 2 tablets (50 mg total) by mouth daily. Needs office visit prior to any  additional refills.        All past medical history, surgical history, allergies, family history, immunizations andmedications were updated in the EMR today and reviewed under the history and medication portions of their EMR.    Recent Results (from the past 2160 hour(s))  POCT HgB A1C     Status: Abnormal   Collection Time: 02/02/21 10:43 AM  Result Value Ref Range   Hemoglobin A1C 5.7 (A) 4.0 - 5.6 %   HbA1c POC (<> result, manual entry) 5.7 4.0 - 5.6 %   HbA1c, POC (prediabetic range) 5.7 5.7 - 6.4 %   HbA1c, POC (controlled diabetic range) 5.7 0.0 - 7.0 %      Patient was never admitted.   ROS: 14 pt review of systems performed and negative (unless mentioned in an HPI)  Objective: BP 116/76   Pulse 73   Temp 98 F (36.7 C) (Oral)   Ht 5\' 10"  (1.778 m)   Wt (!) 347 lb (157.4 kg)   SpO2 99%   BMI 49.79 kg/m   Gen: Afebrile. No acute distress. Very pleasant male obese.  HENT: AT. [place].  Eyes:Pupils Equal Round Reactive to light, Extraocular movements intact,  Conjunctiva without redness, discharge or icterus. CV: RRR no murmur, no edema Chest: CTAB, no wheeze or crackles Skin: no rashes, purpura or petechiae.  Neuro: Normal gait. PERLA. EOMi. Alert. Oriented x3 Psych: Normal affect, dress and demeanor. Normal speech.  Normal thought content and judgment.  Assessment/plan: Darren Carlson is a 45 y.o. male present for establishment visit with  Essential hypertension/dyspnea on exertion/morbid obesity Acute combined systolic and diastolic heart failure (HCC)/hyperlipidemia goal less than 70 LDL Stable - Ejection fraction improved, return to normal. LVH, trace MR. - Thoracic aortic aneurysm --> repeat imaging felt he did not have an  aneurysm and original was artifact.  -continue  amlodipine 5 mg daily -continue losartan 100 mg daily (gout) -continue Lasix 40 mg daily -continue Aldactone 50 mg daily.   - Continue daily baby aspirin. -Continue atorvastatin 80 mg daily. - Continue follow-up per routine schedule with cardiology and cardiothoracic surgeon. - lipids collected today since recent increase and atorvastatin. - f/u 5 mos  Diabetes-hyperlipidemia goal less than 70 LDL/metabolic syndrome/morbid obesity: -Doing great with weight loss and diet Continue metformin 500 mg daily  offered Ozempic once week injection which would provide him cardiovascular protection, help him lose weight and cover diabetes and he declined at this time. A1c 6.4--> 6.3 --> 5.8-->6.8>6.3>6.8> 5.9> 7.8> 5.7 today.   - Continue to diet and exercise. -EYE: 04/21/2019 Dr. 06/19/2019 encouraged yearly eye exams -He declined his pneumonia vaccines and his flu vaccine. -Foot exam completed 09/27/2020 - f/u 5.5 months    Orders Placed This Encounter  Procedures   Lipid panel   POCT HgB A1C   Meds ordered this encounter  Medications   amLODipine (NORVASC) 5 MG tablet    Sig: Take 1 tablet (5 mg total) by mouth daily.    Dispense:  90 tablet    Refill:  1   furosemide (LASIX) 20 MG tablet    Sig: Take 2 tablets (40 mg total) by mouth daily.    Dispense:  180 tablet    Refill:  1   losartan (COZAAR) 100 MG tablet    Sig: Take 1 tablet (100 mg total) by mouth daily.    Dispense:  90 tablet    Refill:  1   metFORMIN  (GLUCOPHAGE) 1000 MG tablet    Sig:  Take 1 tablet (1,000 mg total) by mouth daily with breakfast.    Dispense:  90 tablet    Refill:  1   spironolactone (ALDACTONE) 25 MG tablet    Sig: Take 2 tablets (50 mg total) by mouth daily. Needs office visit prior to any additional refills.    Dispense:  180 tablet    Refill:  1      Note is dictated utilizing voice recognition software. Although note has been proof read prior to signing, occasional typographical errors still can be missed. If any questions arise, please do not hesitate to call for verification.   Electronically signed by: Felix Pacini, DO [facility] [place]

## 2021-02-02 NOTE — Patient Instructions (Signed)
   Great to see you today.  I have refilled the medication(s) we provide.   If labs were collected, we will inform you of lab results once received either by echart message or telephone call.   - echart message- for normal results that have been seen by the patient already.   - telephone call: abnormal results or if patient has not viewed results in their echart.   Follow up in 5.5 months.  

## 2021-02-06 ENCOUNTER — Telehealth: Payer: Self-pay | Admitting: Family Medicine

## 2021-02-06 MED ORDER — ATORVASTATIN CALCIUM 40 MG PO TABS
40.0000 mg | ORAL_TABLET | Freq: Every day | ORAL | 3 refills | Status: DC
Start: 2021-02-06 — End: 2021-10-26

## 2021-02-06 NOTE — Telephone Encounter (Signed)
Called in atorvastatin 40 mg per patient's desire.  He is refusing 80 mg atorvastatin, which is recommended per American Heart Association criteria for patient's with his medical history chronic disease to reach his LDL goal of less than 70.

## 2021-02-06 NOTE — Telephone Encounter (Signed)
-----   Message from Maxie Barb, [place] sent at 02/06/2021  9:28 AM EDT ----- Spoke with pt regarding results and instructions. Pt states he has not and will not inc medication and will like a new script of the 40 mg medication. Please advise

## 2021-04-10 ENCOUNTER — Encounter: Payer: Self-pay | Admitting: Family Medicine

## 2021-04-10 DIAGNOSIS — B309 Viral conjunctivitis, unspecified: Secondary | ICD-10-CM | POA: Diagnosis not present

## 2021-06-06 DIAGNOSIS — T781XXD Other adverse food reactions, not elsewhere classified, subsequent encounter: Secondary | ICD-10-CM | POA: Diagnosis not present

## 2021-07-13 ENCOUNTER — Encounter: Payer: Self-pay | Admitting: Family Medicine

## 2021-07-13 ENCOUNTER — Ambulatory Visit: Payer: BC Managed Care – PPO | Admitting: Family Medicine

## 2021-07-13 VITALS — BP 130/79 | HR 68 | Temp 97.7°F | Ht 70.0 in | Wt 341.0 lb

## 2021-07-13 DIAGNOSIS — Z6841 Body Mass Index (BMI) 40.0 and over, adult: Secondary | ICD-10-CM

## 2021-07-13 DIAGNOSIS — I10 Essential (primary) hypertension: Secondary | ICD-10-CM | POA: Diagnosis not present

## 2021-07-13 DIAGNOSIS — I5032 Chronic diastolic (congestive) heart failure: Secondary | ICD-10-CM | POA: Diagnosis not present

## 2021-07-13 DIAGNOSIS — E1169 Type 2 diabetes mellitus with other specified complication: Secondary | ICD-10-CM

## 2021-07-13 DIAGNOSIS — R600 Localized edema: Secondary | ICD-10-CM | POA: Diagnosis not present

## 2021-07-13 DIAGNOSIS — I420 Dilated cardiomyopathy: Secondary | ICD-10-CM

## 2021-07-13 DIAGNOSIS — E785 Hyperlipidemia, unspecified: Secondary | ICD-10-CM

## 2021-07-13 LAB — MICROALBUMIN / CREATININE URINE RATIO
Creatinine,U: 30.3 mg/dL
Microalb Creat Ratio: 2.3 mg/g (ref 0.0–30.0)
Microalb, Ur: <0.7 mg/dL (ref 0.0–1.9)

## 2021-07-13 LAB — CBC
HCT: 45.4 % (ref 39.0–52.0)
Hemoglobin: 15.1 g/dL (ref 13.0–17.0)
MCHC: 33.3 g/dL (ref 30.0–36.0)
MCV: 83.5 fl (ref 78.0–100.0)
Platelets: 178 10*3/uL (ref 150.0–400.0)
RBC: 5.43 Mil/uL (ref 4.22–5.81)
RDW: 13.6 % (ref 11.5–15.5)
WBC: 6.6 10*3/uL (ref 4.0–10.5)

## 2021-07-13 LAB — COMPREHENSIVE METABOLIC PANEL
ALT: 21 U/L (ref 0–53)
AST: 22 U/L (ref 0–37)
Albumin: 4.7 g/dL (ref 3.5–5.2)
Alkaline Phosphatase: 40 U/L (ref 39–117)
BUN: 16 mg/dL (ref 6–23)
CO2: 29 mEq/L (ref 19–32)
Calcium: 9.8 mg/dL (ref 8.4–10.5)
Chloride: 100 mEq/L (ref 96–112)
Creatinine, Ser: 1.12 mg/dL (ref 0.40–1.50)
GFR: 79.83 mL/min (ref >60.00)
Glucose, Bld: 121 mg/dL — ABNORMAL HIGH (ref 70–99)
Potassium: 4 mEq/L (ref 3.5–5.1)
Sodium: 138 mEq/L (ref 135–145)
Total Bilirubin: 1.9 mg/dL — ABNORMAL HIGH (ref 0.2–1.2)
Total Protein: 6.9 g/dL (ref 6.0–8.3)

## 2021-07-13 LAB — TSH: TSH: 1.27 u[IU]/mL (ref 0.35–5.50)

## 2021-07-13 LAB — HEMOGLOBIN A1C: Hgb A1c MFr Bld: 6.7 % — ABNORMAL HIGH (ref 4.6–6.5)

## 2021-07-13 MED ORDER — SPIRONOLACTONE 25 MG PO TABS: 50.0000 mg | ORAL_TABLET | Freq: Every day | ORAL | 1 refills | Status: DC

## 2021-07-13 MED ORDER — OZEMPIC (0.25 OR 0.5 MG/DOSE) 2 MG/1.5ML ~~LOC~~ SOPN: PEN_INJECTOR | SUBCUTANEOUS | 0 refills | Status: AC

## 2021-07-13 MED ORDER — FUROSEMIDE 20 MG PO TABS: 40.0000 mg | ORAL_TABLET | Freq: Every day | ORAL | 1 refills | Status: DC

## 2021-07-13 MED ORDER — METFORMIN HCL 1000 MG PO TABS: 1000.0000 mg | ORAL_TABLET | Freq: Every day | ORAL | 1 refills | Status: DC

## 2021-07-13 MED ORDER — METFORMIN HCL 500 MG PO TABS: 500.0000 mg | ORAL_TABLET | Freq: Every day | ORAL | 1 refills | Status: DC

## 2021-07-13 MED ORDER — AMLODIPINE BESYLATE 5 MG PO TABS: 5.0000 mg | ORAL_TABLET | Freq: Every day | ORAL | 1 refills | Status: DC

## 2021-07-13 MED ORDER — LOSARTAN POTASSIUM 100 MG PO TABS: 100.0000 mg | ORAL_TABLET | Freq: Every day | ORAL | 1 refills | Status: DC

## 2021-07-13 NOTE — Patient Instructions (Addendum)
? ?  Return in about 24 weeks (around 12/28/2021) for Routine chronic condition follow-up. ? ?Great to see you today.  ?I have refilled the medication(s) we provide.  ? ?If labs were collected, we will inform you of lab results once received either by echart message or telephone call.  ? - echart message- for normal results that have been seen by the patient already.  ? - telephone call: abnormal results or if patient has not viewed results in their echart. ? ? ?

## 2021-07-13 NOTE — Progress Notes (Signed)
? ? ? ?Patient ID: Darren Carlson, male  DOB: August 06, 1975, 46 y.o.   MRN: [id number] ?Patient Care Team  ?  Relationship Specialty Notifications Start End  ?Natalia Leatherwood, DO PCP - General Family Medicine  01/23/17   ?Alleen Borne, MD Consulting Physician Cardiothoracic Surgery  05/03/17   ?Georgeanna Lea, MD Consulting Physician Cardiology  05/03/17   ? ? ?Chief Complaint  ?Patient presents with  ? Diabetes  ?  CMC; pt is not fasting  ? ? ?Subjective:Darren Carlson is a 46 y.o. male present for [facility] ?[name] is a 46 y.o.  male present for follow-up on ?diabetes/metabolic syndrome/morbid obesity: ?Pt reports compliance with    metformin 500 mg daily. He had not increased as we discussed d/t SE. He has been working very hard on his diet and exercise. He has lost weight and feeling better. Patient denies dizziness, hyperglycemic or hypoglycemic events. Patient denies numbness, tingling in the extremities or nonhealing wounds of feet.  ? ?hypertension/hyperlipidemia/acute combined systolic and diastolic heart failure/cardiomyopathy ?Pt reports compliance amlodipine 5 mg daily,  lisinopril 40 mg daily, Lasix 40 mg daily and spirolactone 50 mg qd. Patient denies chest pain, shortness of breath, dizziness or lower extremity edema.   ?Coreg discontinued and metoprolol XL discontinued (by pt) secondary to side effects. ?He has established with cardiology and CTS. patient does not have a thoracic aortic aneurysm.  Repeat imaging ruled out aneurysm- artifact caused misdiagnosis.  ?Microalbumin:  collected today per protocol ?Diet: Watching diet very closely, low-sodium ?Exercise: Routine exercise ?RF: Hypertension, hyperlipidemia, obesity, dilated cardiomyopathy, thoracic aortic aneurysm. ? ?CTA 09/17/2017: ?CLINICAL DATA:  Follow-up of aneurysmal disease of the ascending ?thoracic aorta. ?EXAM: ?CT ANGIOGRAPHY CHEST WITH CONTRAST ?CONTRAST:  ISOVUE-370 IOPAMIDOL (ISOVUE-370) INJECTION 76% ?COMPARISON:   03/22/2017 ?IMPRESSION: ?Previous aortic dimensions are felt to be overestimated due to ?motion artifact. The ascending thoracic aorta actually measures 4 cm ?in greatest diameter, which is at the upper limits of normal. ?  ?CT anterior chest 03/12/2017: ?IMPRESSION: ?1. Ascending thoracic aorta measures 4.7 x 4.4 cm.   ?2.  No demonstrable pulmonary embolus. ?3. There is prominence of the main pulmonary outflow tract with a ?measured transverse diameter of 3.9 cm. This finding is concerning ?for underlying pulmonary arterial hypertension. ?4.  No lung edema or consolidation. ?5.  No evident thoracic adenopathy. ? ?Echocardiogram 04/26/2017: ?LV EF: 55-65 % ?Study Conclusions ?- Left ventricle: The cavity size was normal. There was moderate ?  concentric hypertrophy. Systolic function was normal. The ?  estimated ejection fraction was in the range of 55% to 65%. Wall ?  motion was normal; there were no regional wall motion ?  abnormalities. The study is not technically sufficient to allow ?  evaluation of LV diastolic function. ?- Aortic valve: Valve area (VTI): 3.44 cm^2. Valve area (Vmax): ?  3.05 cm^2. Valve area (Vmean): 2.95 cm^2. ?Impressions: ?- Normal LVEF. ?  Moderate LVH. ?  Trace MR. ? ? ?  02/02/2021  ? 10:27 AM 09/27/2020  ?  9:40 AM 12/23/2019  ? 10:52 AM 06/24/2018  ?  9:15 AM 12/16/2017  ?  9:38 AM  ?Depression screen PHQ 2/9  ?Decreased Interest 0 0 0 0 0  ?Down, Depressed, Hopeless 0 0 0 0 0  ?PHQ - 2 Score 0 0 0 0 0  ?Altered sleeping    0   ?Tired, decreased energy    0   ?Change in appetite    1   ?Feeling bad  or failure about yourself     0   ?Trouble concentrating    0   ?Moving slowly or fidgety/restless    0   ?Suicidal thoughts    0   ?PHQ-9 Score    1   ?Difficult doing work/chores    Not difficult at all   ? ? ?Past Medical History:  ?Diagnosis Date  ? Asthma   ? Environmental allergies   ? GERD (gastroesophageal reflux disease)   ? Hemorrhoids   ? Obesity   ? ?Allergies  ?Allergen Reactions   ? Amoxicillin Rash  ? Penicillins Rash  ? ?Past Surgical History:  ?Procedure Laterality Date  ? WISDOM TOOTH EXTRACTION    ? ?Family History  ?Problem Relation Age of Onset  ? Colon cancer Maternal Grandmother [id number]  ?     early death  ? Diabetes Paternal Grandmother   ? Kidney disease Paternal Grandmother   ? Diabetes Paternal Grandfather   ? Kidney disease Paternal Grandfather   ? Colon cancer Maternal Aunt 35  ? Colon cancer Cousin   ? Stomach cancer Neg Hx   ? Pancreatic cancer Neg Hx   ? Esophageal cancer Neg Hx   ? ?Social History  ? ?Socioeconomic History  ? Marital status: Married  ?  Spouse name: Darren Carlson  ? Number of children: 2  ? Years of education: 3816  ? Highest education level: Bachelor's degree (e.g., BA, AB, BS)  ?Occupational History  ? Occupation: Sports administratorrestaurant owner  ?Tobacco Use  ? Smoking status: Never  ? Smokeless tobacco: Never  ?Vaping Use  ? Vaping Use: Never used  ?Substance and Sexual Activity  ? Alcohol use: Yes  ?  Comment: occasional  ? Drug use: No  ? Sexual activity: Yes  ?  Partners: Female  ?  Birth control/protection: None  ?Other Topics Concern  ? Not on file  ?Social History Narrative  ? Married to [place], 2 children.   ? Bachelors degree. 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 Social alcohol use. Drinks caffeine.   ? Smoke alarm in the home. Wears seatbelt.   ? Feels safe in his relationships.   ? ?Social Determinants of Health  ? ?Financial Resource Strain: Low Risk   ? Difficulty of Paying Living Expenses: Not hard at all  ?Food Insecurity: No Food Insecurity  ? Worried About Programme researcher, broadcasting/film/videounning Out of Food in the Last Year: Never true  ? Ran Out of Food in the Last Year: Never true  ?Transportation Needs: No Transportation Needs  ? Lack of Transportation (Medical): No  ? Lack of Transportation (Non-Medical): No  ?Physical Activity: Sufficiently Active  ? Days of Exercise per Week: 4 days  ? Minutes of Exercise per Session: 90 min  ?Stress: No Stress Concern Present  ? Feeling of Stress : Not  at all  ?Social Connections: Socially Integrated  ? Frequency of Communication with Friends and Family: More than three times a week  ? Frequency of Social Gatherings with Friends and Family: Patient refused  ? Attends Religious Services: More than 4 times per year  ? Active Member of Clubs or Organizations: Yes  ? Attends BankerClub or Organization Meetings: More than 4 times per year  ? Marital Status: Married  ?Intimate Partner Violence: Not on file  ? ?Allergies as of 07/13/2021   ? ?   Reactions  ? Amoxicillin Rash  ? Penicillins Rash  ? ?  ? ?  ?Medication List  ?  ? ?  ? Accurate  as of July 13, 2021 10:37 AM. If you have any questions, ask your nurse or doctor.  ?  ?  ? ?  ? ?amLODipine 5 MG tablet ?Commonly known as: NORVASC ?Take 1 tablet (5 mg total) by mouth daily. ?  ?atorvastatin 40 MG tablet ?Commonly known as: LIPITOR ?Take 1 tablet (40 mg total) by mouth daily. ?  ?furosemide 20 MG tablet ?Commonly known as: LASIX ?Take 2 tablets (40 mg total) by mouth daily. ?  ?loratadine 10 MG tablet ?Commonly known as: CLARITIN ?Take 10 mg by mouth daily. ?  ?losartan 100 MG tablet ?Commonly known as: COZAAR ?Take 1 tablet (100 mg total) by mouth daily. ?  ?metFORMIN 500 MG tablet ?Commonly known as: GLUCOPHAGE ?Take 1 tablet (500 mg total) by mouth daily with breakfast. ?What changed:  ?medication strength ?how much to take ?Changed by: Felix Pacini, DO ?  ?Ozempic (0.25 or 0.5 MG/DOSE) 2 MG/1.5ML Sopn ?Generic drug: Semaglutide(0.25 or 0.5MG /DOS) ?Inject 0.25 mg into the skin once a week for 28 days, THEN 0.5 mg once a week. ?Start taking on: July 13, 2021 ?Started by: Felix Pacini, DO ?  ?spironolactone 25 MG tablet ?Commonly known as: ALDACTONE ?Take 2 tablets (50 mg total) by mouth daily. Needs office visit prior to any additional refills. ?  ? ?  ? ? ?All past medical history, surgical history, allergies, family history, immunizations andmedications were updated in the EMR today and reviewed under the history and  medication portions of their EMR.    ?No results found for this or any previous visit (from the past 2160 hour(s)). ? ? ? ? ?Patient was never admitted. ? ? ?ROS: 14 pt review of systems performed and negative (unless

## 2021-07-18 ENCOUNTER — Telehealth: Payer: Self-pay | Admitting: Family Medicine

## 2021-07-18 NOTE — Telephone Encounter (Signed)
Please sched 8 weeks f/u  ?

## 2021-07-18 NOTE — Telephone Encounter (Signed)
LVM for pt to CB regarding results.  

## 2021-07-18 NOTE — Telephone Encounter (Signed)
Please call patient ?Urinalysis is negative for protein. ?Liver, kidney and thyroid function are normal ?Total bilirubin is mildly elevated, this is common with fasting labs and has been present with him in the past.  Not worrisome. ?Blood cell counts and electrolytes are normal ?Diabetes screening/A1c raised to 6.7.  I called in the Ozempic start/taper 0.25 weekly injection for 4 weeks, then increase to 0.5 weekly injection for 4 week.  I would recommend follow-up in 8 weeks.  Please advise him to schedule this earlier follow-up, so we can see if he meets criteria to increase the Ozempic again. ? ? ?

## 2021-07-19 NOTE — Telephone Encounter (Signed)
Spoke with pt regarding labs and instructions.   

## 2021-10-13 ENCOUNTER — Ambulatory Visit: Payer: BC Managed Care – PPO | Admitting: Family Medicine

## 2021-10-26 ENCOUNTER — Encounter: Payer: Self-pay | Admitting: Family Medicine

## 2021-10-26 ENCOUNTER — Ambulatory Visit: Payer: BC Managed Care – PPO | Admitting: Family Medicine

## 2021-10-26 VITALS — BP 126/73 | HR 70 | Temp 98.0°F | Ht 70.0 in | Wt 294.0 lb

## 2021-10-26 DIAGNOSIS — E1169 Type 2 diabetes mellitus with other specified complication: Secondary | ICD-10-CM

## 2021-10-26 DIAGNOSIS — Z6841 Body Mass Index (BMI) 40.0 and over, adult: Secondary | ICD-10-CM

## 2021-10-26 DIAGNOSIS — I5032 Chronic diastolic (congestive) heart failure: Secondary | ICD-10-CM | POA: Diagnosis not present

## 2021-10-26 DIAGNOSIS — E119 Type 2 diabetes mellitus without complications: Secondary | ICD-10-CM

## 2021-10-26 DIAGNOSIS — I420 Dilated cardiomyopathy: Secondary | ICD-10-CM

## 2021-10-26 DIAGNOSIS — E785 Hyperlipidemia, unspecified: Secondary | ICD-10-CM

## 2021-10-26 DIAGNOSIS — I10 Essential (primary) hypertension: Secondary | ICD-10-CM

## 2021-10-26 LAB — POCT GLYCOSYLATED HEMOGLOBIN (HGB A1C)
HbA1c POC (<> result, manual entry): 5.4 % (ref 4.0–5.6)
HbA1c, POC (controlled diabetic range): 5.4 % (ref 0.0–7.0)
HbA1c, POC (prediabetic range): 5.4 % — AB (ref 5.7–6.4)
Hemoglobin A1C: 5.4 % (ref 4.0–5.6)

## 2021-10-26 MED ORDER — ATORVASTATIN CALCIUM 40 MG PO TABS: 40.0000 mg | ORAL_TABLET | Freq: Every day | ORAL | 3 refills | Status: DC

## 2021-10-26 MED ORDER — FUROSEMIDE 20 MG PO TABS
40.0000 mg | ORAL_TABLET | Freq: Every day | ORAL | 1 refills | Status: DC
Start: 2021-10-26 — End: 2021-12-28

## 2021-10-26 MED ORDER — LOSARTAN POTASSIUM 100 MG PO TABS: 100.0000 mg | ORAL_TABLET | Freq: Every day | ORAL | 1 refills | Status: DC

## 2021-10-26 NOTE — Progress Notes (Signed)
Patient ID: Darren Carlson, male  DOB: 1975/07/04, 46 y.o.   MRN: [id number] Patient Care Team    Relationship Specialty Notifications Start End  Natalia Leatherwood, DO PCP - General Family Medicine  01/23/17   Alleen Borne, MD Consulting Physician Cardiothoracic Surgery  05/03/17   Georgeanna Lea, MD Consulting Physician Cardiology  05/03/17     Chief Complaint  Patient presents with   Diabetes    [facility]; pt is fasting    Subjective:Darren Carlson is a 46 y.o. male present for [facility] Darren Carlson is a 46 y.o.  male present for follow-up on diabetes/metabolic syndrome/morbid obesity: Pt reports compliance he stopped    metformin 500 mg daily (increased dose caused SE) and ozempic was prescribed but he did not start yet. He has lost 50 LBS on his own!!!.  He has been working very hard on his diet and exercise. Patient denies dizziness, hyperglycemic or hypoglycemic events. Patient denies numbness, tingling in the extremities or nonhealing wounds of feet.   Vitals - 1 value per visit     Weight (lb) Height BMI  07/13/2021 341 (H)  5\' 10"  (1.778 m)  48.93 kg/m2      Vitals - 1 value per visit     Weight (lb) Height BMI  10/26/2021 294  5\' 10"  (1.778 m)  42.18 kg/m2      hypertension/hyperlipidemia/acute combined systolic and diastolic heart failure/cardiomyopathy Pt reports he stopped amlodipine 5 mg daily and lipitor He continue  losartan 100 mg daily, Lasix 40 mg daily and spirolactone 50 mg qd. Patient denies chest pain, shortness of breath, dizziness or lower extremity edema.  Coreg discontinued and metoprolol XL discontinued (by pt) secondary to side effects. He has established with cardiology and CTS. patient does not have a thoracic aortic aneurysm.  Repeat imaging ruled out aneurysm- artifact caused misdiagnosis.  Microalbumin:  collected today per protocol Diet: Watching diet very closely, low-sodium Exercise: Routine exercise RF: Hypertension, hyperlipidemia, obesity, dilated  cardiomyopathy, thoracic aortic aneurysm.  CTA 09/17/2017: CLINICAL DATA:  Follow-up of aneurysmal disease of the ascending thoracic aorta. EXAM: CT ANGIOGRAPHY CHEST WITH CONTRAST CONTRAST:  ISOVUE-370 IOPAMIDOL (ISOVUE-370) INJECTION 76% COMPARISON:  03/22/2017 IMPRESSION: Previous aortic dimensions are felt to be overestimated due to motion artifact. The ascending thoracic aorta actually measures 4 cm in greatest diameter, which is at the upper limits of normal.   CT anterior chest 03/12/2017: IMPRESSION: 1. Ascending thoracic aorta measures 4.7 x 4.4 cm.   2.  No demonstrable pulmonary embolus. 3. There is prominence of the main pulmonary outflow tract with a measured transverse diameter of 3.9 cm. This finding is concerning for underlying pulmonary arterial hypertension. 4.  No lung edema or consolidation. 5.  No evident thoracic adenopathy.  Echocardiogram 04/26/2017: LV EF: 55-65 % Study Conclusions - Left ventricle: The cavity size was normal. There was moderate   concentric hypertrophy. Systolic function was normal. The   estimated ejection fraction was in the range of 55% to 65%. Wall   motion was normal; there were no regional wall motion   abnormalities. The study is not technically sufficient to allow   evaluation of LV diastolic function. - Aortic valve: Valve area (VTI): 3.44 cm^2. Valve area (Vmax):   3.05 cm^2. Valve area (Vmean): 2.95 cm^2. Impressions: - Normal LVEF.   Moderate LVH.   Trace MR.     02/02/2021   10:27 AM 09/27/2020    9:40 AM 12/23/2019   10:52 AM 06/24/2018  9:15 AM 12/16/2017    9:38 AM  Depression screen PHQ 2/9  Decreased Interest 0 0 0 0 0  Down, Depressed, Hopeless 0 0 0 0 0  PHQ - 2 Score 0 0 0 0 0  Altered sleeping    0   Tired, decreased energy    0   Change in appetite    1   Feeling bad or failure about yourself     0   Trouble concentrating    0   Moving slowly or fidgety/restless    0   Suicidal thoughts    0    PHQ-9 Score    1   Difficult doing work/chores    Not difficult at all     Past Medical History:  Diagnosis Date   Asthma    Environmental allergies    GERD (gastroesophageal reflux disease)    Hemorrhoids    Obesity    Allergies  Allergen Reactions   Amoxicillin Rash   Penicillins Rash   Past Surgical History:  Procedure Laterality Date   WISDOM TOOTH EXTRACTION     Family History  Problem Relation Age of Onset   Colon cancer Maternal Grandmother 4       early death   Diabetes Paternal Grandmother    Kidney disease Paternal Grandmother    Diabetes Paternal Grandfather    Kidney disease Paternal Grandfather    Colon cancer Maternal Aunt 35   Colon cancer Cousin    Stomach cancer Neg Hx    Pancreatic cancer Neg Hx    Esophageal cancer Neg Hx    Social History   Socioeconomic History   Marital status: Married    Spouse name: Darren Carlson   Number of children: 2   Years of education: 16   Highest education level: Bachelor's degree (e.g., BA, AB, BS)  Occupational History   Occupation: Sports administrator  Tobacco Use   Smoking status: Never   Smokeless tobacco: Never  Vaping Use   Vaping Use: Never used  Substance and Sexual Activity   Alcohol use: Yes    Comment: occasional   Drug use: No   Sexual activity: Yes    Partners: Female    Birth control/protection: None  Other Topics Concern   Not on file  Social History Narrative   Married to Darren Carlson, 2 children.    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 Drinks caffeine.    Smoke alarm in the home. Wears seatbelt.    Feels safe in his relationships.    Social Determinants of Health   Financial Resource Strain: Low Risk  (07/12/2021)   Overall Financial Resource Strain (CARDIA)    Difficulty of Paying Living Expenses: Not hard at all  Food Insecurity: No Food Insecurity (07/12/2021)   Hunger Vital Sign    Worried About Running Out of Food in the Last Year: Never true    Ran  Out of Food in the Last Year: Never true  Transportation Needs: No Transportation Needs (07/12/2021)   PRAPARE - Administrator, Civil Service (Medical): No    Lack of Transportation (Non-Medical): No  Physical Activity: Sufficiently Active (07/12/2021)   Exercise Vital Sign    Days of Exercise per Week: 4 days    Minutes of Exercise per Session: 90 min  Stress: No Stress Concern Present (07/12/2021)   Darren Carlson of Occupational Health - Occupational Stress Questionnaire    Feeling of Stress : Not at all  Social Connections: Socially Integrated (07/12/2021)   Social Connection and Isolation Panel [NHANES]    Frequency of Communication with Friends and Family: More than three times a week    Frequency of Social Gatherings with Friends and Family: Patient refused    Attends Religious Services: More than 4 times per year    Active Member of [employer] or Organizations: Yes    Attends Banker Meetings: More than 4 times per year    Marital Status: Married  Catering manager Violence: Not on file   Allergies as of 10/26/2021       Reactions   Amoxicillin Rash   Penicillins Rash        Medication List        Accurate as of October 26, 2021 11:20 AM. If you have any questions, ask your nurse or doctor.          STOP taking these medications    amLODipine 5 MG tablet Commonly known as: NORVASC Stopped by: Felix Pacini, DO   loratadine 10 MG tablet Commonly known as: CLARITIN Stopped by: Felix Pacini, DO   metFORMIN 500 MG tablet Commonly known as: GLUCOPHAGE Stopped by: Felix Pacini, DO   Semaglutide(0.25 or 0.5MG /DOS) 2 MG/3ML Sopn Stopped by: Felix Pacini, DO   spironolactone 25 MG tablet Commonly known as: ALDACTONE Stopped by: Felix Pacini, DO       TAKE these medications    atorvastatin 40 MG tablet Commonly known as: LIPITOR Take 40 mg by mouth daily. What changed: Another medication with the same name was removed. Continue taking this  medication, and follow the directions you see here. Changed by: Felix Pacini, DO   furosemide 20 MG tablet Commonly known as: LASIX Take 2 tablets (40 mg total) by mouth daily.   losartan 100 MG tablet Commonly known as: COZAAR Take 1 tablet (100 mg total) by mouth daily.        All past medical history, surgical history, allergies, family history, immunizations andmedications were updated in the EMR today and reviewed under the history and medication portions of their EMR.    Recent Results (from the past 2160 hour(s))  POCT HgB A1C     Status: Abnormal   Collection Time: 10/26/21  9:23 AM  Result Value Ref Range   Hemoglobin A1C 5.4 4.0 - 5.6 %   HbA1c POC (<> result, manual entry) 5.4 4.0 - 5.6 %   HbA1c, POC (prediabetic range) 5.4 (A) 5.7 - 6.4 %   HbA1c, POC (controlled diabetic range) 5.4 0.0 - 7.0 %     Patient was never admitted.   ROS: 14 pt review of systems performed and negative (unless mentioned in an HPI)  Objective: BP 126/73   Pulse 70   Temp 98 F (36.7 C) (Oral)   Ht 5\' 10"  (1.778 m)   Wt 294 lb (133.4 kg)   SpO2 97%   BMI 42.18 kg/m   Physical Exam Vitals and nursing note reviewed.  Constitutional:      General: He is not in acute distress.    Appearance: Normal appearance. He is obese. He is not ill-appearing, toxic-appearing or diaphoretic.  HENT:     Head: Normocephalic and atraumatic.  Eyes:     General: No scleral icterus.       Right eye: No discharge.        Left eye: No discharge.     Extraocular Movements: Extraocular movements intact.     Pupils: Pupils are equal, round, and  reactive to light.  Cardiovascular:     Rate and Rhythm: Normal rate and regular rhythm.  Pulmonary:     Effort: Pulmonary effort is normal. No respiratory distress.     Breath sounds: Normal breath sounds. No wheezing, rhonchi or rales.  Musculoskeletal:     Cervical back: Neck supple.     Right lower leg: No edema.     Left lower leg: No edema.   Lymphadenopathy:     Cervical: No cervical adenopathy.  Skin:    General: Skin is warm and dry.     Coloration: Skin is not jaundiced or pale.     Findings: No rash.  Neurological:     Mental Status: He is alert and oriented to person, place, and time. Mental status is at baseline.  Psychiatric:        Mood and Affect: Mood normal.        Behavior: Behavior normal.        Thought Content: Thought content normal.        Judgment: Judgment normal.     Assessment/plan: Bufford Helms is a 46 y.o. male present for establishment visit with  Essential hypertension/dyspnea on exertion/morbid obesity diastolic heart failure (HCC)/hyperlipidemia goal less than 70 LDL Stable on decreased meds. - Ejection fraction improved, return to normal. LVH, trace MR. - Thoracic aortic aneurysm --> repeat imaging felt he did not have an  aneurysm and original was artifact.  - DIScontinue  amlodipine 5 mg daily - continue losartan 100 mg daily (extra benefit w/ gout) - continue  Lasix 40 mg daily (may try to decrease dose next visit) - DIScontinue Aldactone 50 mg daily.   - Continue daily baby aspirin. - continue (restart) atorvastatin 40 mg daily (he has refused 80 mg ).He dcd on his own, counseled. May be able to decrease dose next visit.  - Continue follow-up per routine schedule with cardiology   DIET controlled Diabetes-hyperlipidemia goal less than 70 LDL/metabolic syndrome/morbid obesity: He is doing great- lost 50 lbs!!! DISContinue  metformin 500 mg daily  A1c 6.4--> 6.3 --> 5.8-->6.8>6.3>6.8> 5.9> 7.8> 5.7 > 6.7> 5.4  today.   - Continue to diet and exercise. -EYE: 04/21/2019 Dr. Cherlynn Polo encouraged yearly eye exams -He declined his pneumonia vaccines and his flu vaccine. -Foot exam completed 07/13/2021   Return in about 15 weeks (around 02/08/2022) for Routine chronic condition follow-up.   Orders Placed This Encounter  Procedures   POCT HgB A1C   Meds ordered this encounter  Medications    DISCONTD: atorvastatin (LIPITOR) 40 MG tablet    Sig: Take 1 tablet (40 mg total) by mouth daily.    Dispense:  90 tablet    Refill:  3    Patient refuses 80mg  tab   furosemide (LASIX) 20 MG tablet    Sig: Take 2 tablets (40 mg total) by mouth daily.    Dispense:  180 tablet    Refill:  1   losartan (COZAAR) 100 MG tablet    Sig: Take 1 tablet (100 mg total) by mouth daily.    Dispense:  90 tablet    Refill:  1      Note is dictated utilizing voice recognition software. Although note has been proof read prior to signing, occasional typographical errors still can be missed. If any questions arise, please do not hesitate to call for verification.   Electronically signed by: , DO [facility] [place]

## 2021-10-26 NOTE — Patient Instructions (Addendum)
Return in about 24 weeks (around 04/12/2022) for Routine chronic condition follow-up.        GREAT JOB!!!!

## 2021-10-28 ENCOUNTER — Telehealth: Payer: BC Managed Care – PPO | Admitting: Family Medicine

## 2021-10-28 DIAGNOSIS — M109 Gout, unspecified: Secondary | ICD-10-CM

## 2021-10-28 MED ORDER — INDOMETHACIN 50 MG PO CAPS: 50.0000 mg | ORAL_CAPSULE | Freq: Three times a day (TID) | ORAL | 0 refills | Status: AC

## 2021-10-28 NOTE — Progress Notes (Signed)
E-Visit for Gout Symptoms  We are sorry that you are not feeling well. We are here to help!  Based on what you shared with me it looks like you have a flare of your gout.  Gout is a form of arthritis. It can cause pain and swelling in the joints. At first, it tends to affect only 1 joint - most frequently the big toe. It happens in people who have too much uric acid in the blood. Uric acid is a chemical that is produced when the body breaks down certain foods. Uric acid can form sharp needle-like crystals that build up in the joints and cause pain. Uric acid crystals can also form inside the tubes that carry urine from the kidneys to the bladder. These crystals can turn into "kidney stones" that can cause pain and problems with the flow of urine. People with gout get sudden "flares" or attacks of severe pain, most often the big toe, ankle, or knee. Often the joint also turns red and swells. Usually, only 1 joint is affected, but some people have pain in more than 1 joint. Gout flares tend to happen more often during the night.  The pain from gout can be extreme. The pain and swelling are worst at the beginning of a gout flare. The symptoms then get better within a few days to weeks. It is not clear how the body "turns off" a gout flare.  Do not start any NEW preventative medicine until the gout has cleared completely. However, If you are already on Probenecid or Allopurinol for CHRONIC gout, you may continue taking this during an active flare up  I have prescribed Indomethacin '50mg'$  three times daily for moderate to severe pain for no more than 7 days   HOME CARE Losing weight can help relieve gout. It's not clear that following a specific diet plan will help with gout symptoms but eating a balanced diet can help improve your overall health. It can also help you lose weight, if you are overweight. In general, a healthy diet includes plenty of fruits, vegetables, whole grains, and low-fat dairy  products (labelled "low fat", skim, 2%). Avoid sugar sweetened drinks (including sodas, tea, juice and juice blends, coffee drinks and sports drinks) Limit alcohol to 1-2 drinks of beer, spirits or wine daily these can make gout flares worse. Some people with gout also have other health problems, such as heart disease, high blood pressure, kidney disease, or obesity. If you have any of these issues, it's important to work with your doctor to manage them. This can help improve your overall health and might also help with your gout.  GET HELP RIGHT AWAY IF: Your symptoms persist after you have completed your treatment plan You develop severe diarrhea You develop abnormal sensations  You develop vomiting,   You develop weakness  You develop abdominal pain  FOLLOW UP WITH YOUR PRIMARY PROVIDER IF: If your symptoms do not improve within 10 days  MAKE SURE YOU  Understand these instructions. Will watch your condition. Will get help right away if you are not doing well or get worse.  Thank you for choosing an e-visit.  Your e-visit answers were reviewed by a board certified advanced clinical practitioner to complete your personal care plan. Depending upon the condition, your plan could have included both over the counter or prescription medications.  Please review your pharmacy choice. Make sure the pharmacy is open so you can pick up prescription now. If there is a problem,  you may contact your provider through [employer] and have the prescription routed to another pharmacy.  Your safety is important to [place]. If you have drug allergies check your prescription carefully.   For the next 24 hours you can use MyChart to ask questions about today's visit, request a non-urgent call back, or ask for a work or school excuse. You will get an email in the next two days asking about your experience. I hope that your e-visit has been valuable and will speed your recovery.   I have provided 5 minutes  of non face to face time during this encounter for chart review and documentation.

## 2021-10-29 ENCOUNTER — Encounter: Payer: Self-pay | Admitting: Family Medicine

## 2021-10-29 ENCOUNTER — Encounter: Payer: BC Managed Care – PPO | Admitting: Nurse Practitioner

## 2021-10-29 DIAGNOSIS — M109 Gout, unspecified: Secondary | ICD-10-CM

## 2021-10-29 NOTE — Progress Notes (Signed)
Hi Mr. Sagan,  I tried to call you to explain what we can provide for you. It looks like you were prescribed indomethacin. This does help to reduce inflammation caused by the buildup of uric acid crystals. Other medications that work the same are colchicine and prednisone. Also naproxen and ibuprofen can help but likely not as much. As far as actually keeping uric acid crystals from forming that would be a medication such as allopurinol or uloric and your PCP would have to start you on those medications. I hope this information helps.   Kind Regards,  Bertram Denver FNP-BC

## 2021-10-29 NOTE — Progress Notes (Signed)
I have spent 5 minutes in review of e-visit questionnaire, review and updating patient chart, medical decision making and response to patient.  ° °Omer Monter W Claudy Abdallah, NP ° °  °

## 2021-10-30 NOTE — Telephone Encounter (Signed)
Please advise patient to make an appt with provider to discuss. Can be video visit if desired.

## 2021-10-30 NOTE — Telephone Encounter (Signed)
Please advise if pt needs appt. Uric acid was not done during last appt.

## 2021-11-07 ENCOUNTER — Encounter: Payer: Self-pay | Admitting: Family Medicine

## 2021-11-07 ENCOUNTER — Ambulatory Visit: Payer: BC Managed Care – PPO | Admitting: Family Medicine

## 2021-11-07 VITALS — BP 125/79 | HR 59 | Temp 98.4°F | Ht 70.0 in | Wt 302.0 lb

## 2021-11-07 DIAGNOSIS — M79673 Pain in unspecified foot: Secondary | ICD-10-CM | POA: Diagnosis not present

## 2021-11-07 DIAGNOSIS — M109 Gout, unspecified: Secondary | ICD-10-CM

## 2021-11-07 LAB — URIC ACID: Uric Acid, Serum: 7.8 mg/dL (ref 4.0–7.8)

## 2021-11-07 MED ORDER — COLCHICINE 0.6 MG PO TABS: ORAL_TABLET | ORAL | 3 refills | Status: DC

## 2021-11-07 NOTE — Progress Notes (Signed)
Darren Carlson , 01/21/1976, 46 y.o., male MRN: [id number] Patient Care Team    Relationship Specialty Notifications Start End  Natalia Leatherwood, DO PCP - General Family Medicine  01/23/17   Alleen Borne, MD Consulting Physician Cardiothoracic Surgery  05/03/17   Georgeanna Lea, MD Consulting Physician Cardiology  05/03/17     Chief Complaint  Patient presents with   Foot Pain    Pt c/o BILATERAL foot pain x 2 weeks;      Subjective: Pt presents for an OV with complaints of bilateral pain over the last 2 weeks.  Patient reports it started in his left foot originally.  He has had gout in his left foot in the past in the same location.  He states its along the second metatarsal of his forefoot.  He took indomethacin 3 times daily x7 days prescribed by E- visit provider.  He states he feels like it is worked and the pain in his left foot has resolved.  He has 1 additional tab left.  He then awoke yesterday and noticed pain in his right foot.  He has not had gout in his right foot in the past.  The area of his fourth metatarsal is tender and hurts to walk.  He denies any fevers or erythema. He endorses at onset 2 weeks ago, he likely was dehydrated, had an alcoholic beverage and ate a steak which he routinely never does.     02/02/2021   10:27 AM 09/27/2020    9:40 AM 12/23/2019   10:52 AM 06/24/2018    9:15 AM 12/16/2017    9:38 AM  Depression screen PHQ 2/9  Decreased Interest 0 0 0 0 0  Down, Depressed, Hopeless 0 0 0 0 0  PHQ - 2 Score 0 0 0 0 0  Altered sleeping    0   Tired, decreased energy    0   Change in appetite    1   Feeling bad or failure about yourself     0   Trouble concentrating    0   Moving slowly or fidgety/restless    0   Suicidal thoughts    0   PHQ-9 Score    1   Difficult doing work/chores    Not difficult at all     Allergies  Allergen Reactions   Amoxicillin Rash   Penicillins Rash   Social History   Social History Narrative   Married to  [place], 2 children.    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 Drinks caffeine.    Smoke alarm in the home. Wears seatbelt.    Feels safe in his relationships.    Past Medical History:  Diagnosis Date   Asthma    Environmental allergies    GERD (gastroesophageal reflux disease)    Hemorrhoids    Obesity    Past Surgical History:  Procedure Laterality Date   WISDOM TOOTH EXTRACTION     Family History  Problem Relation Age of Onset   Colon cancer Maternal Grandmother 85       early death   Diabetes Paternal Grandmother    Kidney disease Paternal Grandmother    Diabetes Paternal Grandfather    Kidney disease Paternal Grandfather    Colon cancer Maternal Aunt 35   Colon cancer Cousin    Stomach cancer Neg Hx    Pancreatic cancer Neg Hx    Esophageal cancer Neg Hx  Allergies as of 11/07/2021       Reactions   Amoxicillin Rash   Penicillins Rash        Medication List        Accurate as of November 07, 2021 11:36 AM. If you have any questions, ask your nurse or doctor.          atorvastatin 40 MG tablet Commonly known as: LIPITOR Take 40 mg by mouth daily.   furosemide 20 MG tablet Commonly known as: LASIX Take 2 tablets (40 mg total) by mouth daily.   losartan 100 MG tablet Commonly known as: COZAAR Take 1 tablet (100 mg total) by mouth daily.        All past medical history, surgical history, allergies, family history, immunizations andmedications were updated in the EMR today and reviewed under the history and medication portions of their EMR.     ROS Negative, with the exception of above mentioned in HPI   Objective:  BP 125/79   Pulse (!) 59   Temp 98.4 F (36.9 C) (Oral)   Ht 5\' 10"  (1.778 m)   Wt (!) 302 lb (137 kg)   SpO2 97%   BMI 43.33 kg/m  Body mass index is 43.33 kg/m. Physical Exam Vitals and nursing note reviewed.  Constitutional:      General: He is not in acute distress.    Appearance:  Normal appearance. He is not ill-appearing, toxic-appearing or diaphoretic.  HENT:     Head: Normocephalic and atraumatic.  Eyes:     General: No scleral icterus.       Right eye: No discharge.        Left eye: No discharge.     Extraocular Movements: Extraocular movements intact.     Pupils: Pupils are equal, round, and reactive to light.  Musculoskeletal:        General: Tenderness present. No swelling or deformity. Normal range of motion.  Skin:    General: Skin is warm and dry.     Coloration: Skin is not jaundiced or pale.     Findings: No erythema or rash.  Neurological:     Mental Status: He is alert and oriented to person, place, and time. Mental status is at baseline.  Psychiatric:        Mood and Affect: Mood normal.        Behavior: Behavior normal.        Thought Content: Thought content normal.        Judgment: Judgment normal.      No results found. No results found. No results found for this or any previous visit (from the past 24 hour(s)).  Assessment/Plan: [name] is a 46 y.o. male present for OV for  Pain of foot, unspecified laterality/Acute gout of right foot, unspecified cause - Uric acid level collected today. -Colchicine prescription provided with instructions on proper use. Naproxen for pain encouraged. Hydrate well Low purine diet encouraged Discussed use of allopurinol for preventative measures if gout flares become more persistent. Follow-up as needed   Reviewed expectations re: course of current medical issues. Discussed self-management of symptoms. Outlined signs and symptoms indicating need for more acute intervention. Patient verbalized understanding and all questions were answered. Patient received an After-Visit Summary.    Orders Placed This Encounter  Procedures   Uric acid   No orders of the defined types were placed in this encounter.  Referral Orders  No referral(s) requested today     Note is dictated utilizing  voice recognition software. Although note has been proof read prior to signing, occasional typographical errors still can be missed. If any questions arise, please do not hesitate to call for verification.   electronically signed by:  Howard Pouch, DO  [place]

## 2021-11-07 NOTE — Patient Instructions (Addendum)
Prescribed colchicine - take as prescribed at onset of symptoms   Gout  Gout is painful swelling of your joints. Gout is a type of arthritis. It is caused by having too much uric acid in your body. Uric acid is a chemical that is made when your body breaks down substances called purines. If your body has too much uric acid, sharp crystals can form and build up in your joints. This causes pain and swelling. Gout attacks can happen quickly and be very painful (acute gout). Over time, the attacks can affect more joints and happen more often (chronic gout). What are the causes? Gout is caused by too much uric acid in your blood. This can happen because: Your kidneys do not remove enough uric acid from your blood. Your body makes too much uric acid. You eat too many foods that are high in purines. These foods include organ meats, some seafood, and beer. Trauma or stress can bring on an attack. What increases the risk? Having a family history of gout. Being male and middle-aged. Being male and having gone through menopause. Having an organ transplant. Taking certain medicines. Having certain conditions, such as: Being very overweight (obese). Lead poisoning. Kidney disease. A skin condition called psoriasis. Other risks include: Losing weight too quickly. Not having enough water in the body (being dehydrated). Drinking alcohol, especially beer. Drinking beverages that are sweetened with a type of sugar called fructose. What are the signs or symptoms? An attack of acute gout often starts at night and usually happens in just one joint. The most common place is the big toe. Other joints that may be affected include joints of the feet, ankle, knee, fingers, wrist, or elbow. Symptoms may include: Very bad pain. Warmth. Swelling. Stiffness. Tenderness. The affected joint may be very painful to touch. Shiny, red, or purple skin. Chills and fever. Chronic gout may cause symptoms more often.  More joints may be involved. You may also have white or yellow lumps (tophi) on your hands or feet or in other areas near your joints. How is this treated? Treatment for an acute attack may include medicines for pain and swelling, such as: NSAIDs, such as ibuprofen. Steroids taken by mouth or injected into a joint. Colchicine. This can be given by mouth or through an IV tube. Treatment to prevent future attacks may include: Taking small doses of NSAIDs or colchicine daily. Using a medicine that reduces uric acid levels in your blood, such as allopurinol. Making changes to your diet. You may need to see a food expert (dietitian) about what to eat and drink to prevent gout. Follow these instructions at home: During a gout attack  If told, put ice on the painful area. To do this: Put ice in a plastic bag. Place a towel between your skin and the bag. Leave the ice on for 20 minutes, 2-3 times a day. Take off the ice if your skin turns bright red. This is very important. If you cannot feel pain, heat, or cold, you have a greater risk of damage to the area. Raise the painful joint above the level of your heart as often as you can. Rest the joint as much as possible. If the joint is in your leg, you may be given crutches. Follow instructions from your doctor about what you cannot eat or drink. Avoiding future gout attacks Eat a low-purine diet. Avoid foods and drinks such as: Liver. Kidney. Anchovies. Asparagus. Herring. Mushrooms. Mussels. Beer. Stay at a healthy weight.  If you want to lose weight, talk with your doctor. Do not lose weight too fast. Start or continue an exercise plan as told by your doctor. Eating and drinking Avoid drinks sweetened by fructose. Drink enough fluids to keep your pee (urine) pale yellow. If you drink alcohol: Limit how much you have to: 0-1 drink a day for women who are not pregnant. 0-2 drinks a day for men. Know how much alcohol is in a drink. In  the [place] one drink equals one 12 oz bottle of beer (355 mL), one 5 oz glass of wine (148 mL), or one 1 oz glass of hard liquor (44 mL). General instructions Take over-the-counter and prescription medicines only as told by your doctor. Ask your doctor if you should avoid driving or using machines while you are taking your medicine. Return to your normal activities when your doctor says that it is safe. Keep all follow-up visits. Where to find more information [employer]: www.niams.http://www.myers.net/ Contact a doctor if: You have another gout attack. You still have symptoms of a gout attack after 10 days of treatment. You have problems (side effects) because of your medicines. You have chills or a fever. You have burning pain when you pee (urinate). You have pain in your lower back or belly. Get help right away if: You have very bad pain. Your pain cannot be controlled. You cannot pee. Summary Gout is painful swelling of the joints. The most common site of pain is the big toe, but it can affect other joints. Medicines and avoiding some foods can help to prevent and treat gout attacks. This information is not intended to replace advice given to you by your health care provider. Make sure you discuss any questions you have with your health care provider. Document Revised: 12/28/2020 Document Reviewed: 12/28/2020 Elsevier Patient Education  2023 [employer].

## 2021-11-08 ENCOUNTER — Telehealth: Payer: Self-pay | Admitting: Family Medicine

## 2021-11-08 NOTE — Telephone Encounter (Signed)
Uric acid level is 7.8.  This is the highest end of normal.   Uric acid levels are likely now declining from initial presentation last week.  The colchicine should help resolve the last of the symptoms. Continue to hydrate.

## 2021-11-08 NOTE — Telephone Encounter (Signed)
LM for pt to return call to discuss.  

## 2021-11-09 NOTE — Telephone Encounter (Signed)
Spoke with pt regarding labs and instructions.   

## 2021-11-09 NOTE — Telephone Encounter (Signed)
LVM for pt to CB regarding results.  

## 2021-11-09 NOTE — Telephone Encounter (Signed)
Patient just missed call regarding lab results.  Please call back when available.

## 2021-11-14 ENCOUNTER — Other Ambulatory Visit: Payer: Self-pay | Admitting: Family Medicine

## 2021-11-29 ENCOUNTER — Telehealth: Payer: Self-pay

## 2021-11-29 NOTE — Telephone Encounter (Signed)
LVM for pt to CB regarding upcoming appt and pt is experiencing any CP, SOB, dizziness, or any cardiac sx to go to ED.    FYI

## 2021-11-30 ENCOUNTER — Ambulatory Visit: Payer: BC Managed Care – PPO | Admitting: Family Medicine

## 2021-11-30 ENCOUNTER — Encounter: Payer: Self-pay | Admitting: Family Medicine

## 2021-11-30 VITALS — BP 133/74 | HR 62 | Temp 98.0°F | Ht 70.0 in | Wt 298.0 lb

## 2021-11-30 DIAGNOSIS — M9902 Segmental and somatic dysfunction of thoracic region: Secondary | ICD-10-CM | POA: Diagnosis not present

## 2021-11-30 DIAGNOSIS — R0789 Other chest pain: Secondary | ICD-10-CM | POA: Diagnosis not present

## 2021-11-30 DIAGNOSIS — M25512 Pain in left shoulder: Secondary | ICD-10-CM

## 2021-11-30 NOTE — Progress Notes (Signed)
Darren Carlson , 05/20/1975, 46 y.o., male MRN: [id number] Patient Care Team    Relationship Specialty Notifications Start End  Ma Hillock, DO PCP - General Family Medicine  01/23/17   Gaye Pollack, MD Consulting Physician Cardiothoracic Surgery  05/03/17   Park Liter, MD Consulting Physician Cardiology  05/03/17     Chief Complaint  Patient presents with   Hypertension    Pt c/o elevated BP of 145/90, chest pain, upper back pain, worsening in certain positions x 2 weeks;       Subjective: [name] is a 46 y.o. Pt, presents for an OV with complaints of left sided chest and shoulder pain of 2 weeks duration.  He has had similar discomfort in the past and had been evaluated by cardio, which felt discomfort was MSK at that time.  Patient has been on a weight loss journey and has been dieting, as well as exercising more frequently.  He has lost close to 90 pounds over the last year.  The pain he is experiencing he points to his left pectoralis muscle group.  This pain is reproducible with movement.  He also is having some tingling that will go down the back of his arm to his elbow.  He denies any cardiac chest pain, dyspnea, dizziness or syncope.     02/02/2021   10:27 AM 09/27/2020    9:40 AM 12/23/2019   10:52 AM 06/24/2018    9:15 AM 12/16/2017    9:38 AM  Depression screen PHQ 2/9  Decreased Interest 0 0 0 0 0  Down, Depressed, Hopeless 0 0 0 0 0  PHQ - 2 Score 0 0 0 0 0  Altered sleeping    0   Tired, decreased energy    0   Change in appetite    1   Feeling bad or failure about yourself     0   Trouble concentrating    0   Moving slowly or fidgety/restless    0   Suicidal thoughts    0   PHQ-9 Score    1   Difficult doing work/chores    Not difficult at all     Allergies  Allergen Reactions   Amoxicillin Rash   Penicillins Rash   Social History   Social History Narrative   Married to [place], 2 children.    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 Drinks caffeine.    Smoke alarm in the home. Wears seatbelt.    Feels safe in his relationships.    Past Medical History:  Diagnosis Date   Asthma    Environmental allergies    GERD (gastroesophageal reflux disease)    Hemorrhoids    Obesity    Past Surgical History:  Procedure Laterality Date   WISDOM TOOTH EXTRACTION     Family History  Problem Relation Age of Onset   Colon cancer Maternal Grandmother 4       early death   Diabetes Paternal Grandmother    Kidney disease Paternal Grandmother    Diabetes Paternal Grandfather    Kidney disease Paternal Grandfather    Colon cancer Maternal Aunt 35   Colon cancer Cousin    Stomach cancer Neg Hx    Pancreatic cancer Neg Hx    Esophageal cancer Neg Hx    Allergies as of 11/30/2021       Reactions   Amoxicillin Rash   Penicillins  Rash        Medication List        Accurate as of November 30, 2021 11:59 PM. If you have any questions, ask your nurse or doctor.          atorvastatin 40 MG tablet Commonly known as: LIPITOR Take 40 mg by mouth daily.   colchicine 0.6 MG tablet 2 tabs PO at onset, then 1 tab 1 hour later. Then 1 tab PO QD until resolves.   furosemide 20 MG tablet Commonly known as: LASIX Take 2 tablets (40 mg total) by mouth daily.   losartan 100 MG tablet Commonly known as: COZAAR Take 1 tablet (100 mg total) by mouth daily.        All past medical history, surgical history, allergies, family history, immunizations andmedications were updated in the EMR today and reviewed under the history and medication portions of their EMR.     ROS Negative, with the exception of above mentioned in HPI   Objective:  BP 133/74   Pulse 62   Temp 98 F (36.7 C) (Oral)   Ht 5\' 10"  (1.778 m)   Wt 298 lb (135.2 kg)   SpO2 98%   BMI 42.76 kg/m  Body mass index is 42.76 kg/m. Physical Exam Vitals and nursing note reviewed.  Constitutional:      General:  He is not in acute distress.    Appearance: Normal appearance. He is obese. He is not ill-appearing, toxic-appearing or diaphoretic.  HENT:     Head: Normocephalic and atraumatic.     Mouth/Throat:     Mouth: Mucous membranes are moist.  Eyes:     General: No scleral icterus.       Right eye: No discharge.        Left eye: No discharge.     Extraocular Movements: Extraocular movements intact.     Pupils: Pupils are equal, round, and reactive to light.  Cardiovascular:     Rate and Rhythm: Normal rate and regular rhythm.     Heart sounds: No murmur heard. Pulmonary:     Effort: Pulmonary effort is normal. No respiratory distress.     Breath sounds: Normal breath sounds. No wheezing, rhonchi or rales.  Chest:     Chest wall: Tenderness present.  Musculoskeletal:        General: Normal range of motion.     Cervical back: Neck supple.     Right lower leg: No edema.     Left lower leg: No edema.     Comments: Left shoulder inferior and mildly anterior in comparison to right  Lymphadenopathy:     Cervical: No cervical adenopathy.  Skin:    General: Skin is warm and dry.     Coloration: Skin is not jaundiced or pale.     Findings: No rash.  Neurological:     Mental Status: He is alert and oriented to person, place, and time. Mental status is at baseline.  Psychiatric:        Mood and Affect: Mood normal.        Behavior: Behavior normal.        Thought Content: Thought content normal.        Judgment: Judgment normal.     No results found. No results found. No results found for this or any previous visit (from the past 24 hour(s)).  Assessment/Plan: Darren Carlson is a 46 y.o. male present for OV for  Thoracic region somatic dysfunction/left shoulder pain/left chest  wall pain Suspect thoracic somatic dysfunction as cause of discomfort and referred pain to left chest wall.  We discussed lower cervical spine and upper thoracic spinal nerves/dermatome patterns. We discussed  multiple different options for treatment including medication, OMT, PT.  He is agreeable to to referral to sports med for OMT today. He would like to proceed with MRI of his thoracic spine since this has been a reoccurring issue for him over the last 2 years. - Ambulatory referral to Sports Medicine - MR Thoracic Spine Wo Contrast; Future   Reviewed expectations re: course of current medical issues. Discussed self-management of symptoms. Outlined signs and symptoms indicating need for more acute intervention. Patient verbalized understanding and all questions were answered. Patient received an After-Visit Summary.    Orders Placed This Encounter  Procedures   MR Thoracic Spine Wo Contrast   Ambulatory referral to Sports Medicine   No orders of the defined types were placed in this encounter.  Referral Orders         Ambulatory referral to Sports Medicine       Note is dictated utilizing voice recognition software. Although note has been proof read prior to signing, occasional typographical errors still can be missed. If any questions arise, please do not hesitate to call for verification.   electronically signed by:  Felix Pacini, DO  [facility] [place]

## 2021-11-30 NOTE — Patient Instructions (Signed)
Thoracic dysfunction.

## 2021-12-14 NOTE — Progress Notes (Signed)
Darren Carlson D.[facility] [address] [place] [address] Phone: [phone]   Assessment and Plan:     1. Left-sided chest pain 2. Somatic dysfunction of cervical region 3. Somatic dysfunction of thoracic region 4. Somatic dysfunction of rib region -Chronic with exacerbation, initial sports medicine visit - Unclear etiology of "tingling" sensation that wraps around patient's left side of his chest - Discomfort may be muscular in nature as when patient tries to straighten up or activate pectoralis musculature this sensation worsens.  May be neurologic as distribution would be consistent with a T3-T4 paresthesia - Not consistent with cardiac cause.  Does not worsen on exertion - Patient elected for initial OMT today.  Tolerated well per note below. - Decision today to treat with OMT was based on Physical Exam  After verbal consent patient was treated with HVLA (high velocity low amplitude), ME (muscle energy), FPR (flex positional release), ST (soft tissue),   techniques in thoracic, rib, C-spine areas. Patient tolerated the procedure well with improvement in symptoms.  Patient educated on potential side effects of soreness and recommended to rest, hydrate, and use Tylenol as needed for pain control.  Other orders - meloxicam (MOBIC) 15 MG tablet; Take 1 tablet (15 mg total) by mouth daily.    Pertinent previous records reviewed include T-spine x-ray 2018   Follow Up: 3 to 4 weeks for reevaluation.  Could review MRI at that time   Subjective:   I, Darren Carlson, am serving as a Neurosurgeon for Darren Carlson  Chief Complaint: thoracic and shoulder pain   HPI:   12/15/2021 Patient is a 46 year old male complaining of thoracic and shoulder pain. Patient states left sided chest and shoulder pain of 2 weeks duration.  He has had similar discomfort in the past and had been evaluated by cardio, which felt discomfort was MSK at that time.   Patient has been on a weight loss journey and has been dieting, as well as exercising more frequently.  He has lost close to 90 pounds over the last year.  The pain he is experiencing he points to his left pectoralis muscle group.  This pain is reproducible with movement.  He also is having some tingling that will go down the back of his arm to his elbow. Is able to replicate the pain down his left shoulder and back , is getting an MRI needs to make an appointment    Relevant Historical Information: Purposeful 100+ pound weight loss over the past year.  History of cardiomyopathy, hypertension  Additional pertinent review of systems negative.   Current Outpatient Medications:    atorvastatin (LIPITOR) 40 MG tablet, Take 40 mg by mouth daily., Disp: , Rfl:    colchicine 0.6 MG tablet, 2 tabs PO at onset, then 1 tab 1 hour later. Then 1 tab PO QD until resolves., Disp: 30 tablet, Rfl: 3   furosemide (LASIX) 20 MG tablet, Take 2 tablets (40 mg total) by mouth daily., Disp: 180 tablet, Rfl: 1   losartan (COZAAR) 100 MG tablet, Take 1 tablet (100 mg total) by mouth daily., Disp: 90 tablet, Rfl: 1   meloxicam (MOBIC) 15 MG tablet, Take 1 tablet (15 mg total) by mouth daily., Disp: 30 tablet, Rfl: 0   Objective:     Vitals:   12/15/21 1009  BP: 130/78  Pulse: 70  SpO2: 98%  Weight: 293 lb (132.9 kg)  Height: 5\' 10"  (1.778 m)  Body mass index is 42.04 kg/m.    Physical Exam:    General: Well-appearing, cooperative, sitting comfortably in no acute distress.   OMT Physical Exam:   Cervical: nTTP paraspinal, no significant dysfunction Rib: Right elevated first rib with nTTP Thoracic: nTTP paraspinal, no significant dysfunction    Gen: Appears well, nad, nontoxic and pleasant Psych: Alert and oriented, appropriate mood and affect Neuro: sensation intact, strength is 5/5 in upper and lower extremities, muscle tone wnl Skin: no susupicious lesions or rashes  Back - Normal skin,  Spine with normal alignment and no deformity.   No tenderness to vertebral process palpation.   Paraspinous muscles are not tender and without spasm Straight leg raise negative Trendelenberg negative    Electronically signed by:  Darren Carlson D.[facility] 1:21 PM 12/18/21

## 2021-12-15 ENCOUNTER — Ambulatory Visit: Payer: BC Managed Care – PPO | Admitting: Sports Medicine

## 2021-12-15 ENCOUNTER — Telehealth: Payer: Self-pay

## 2021-12-15 VITALS — BP 130/78 | HR 70 | Ht 70.0 in | Wt 293.0 lb

## 2021-12-15 DIAGNOSIS — M9901 Segmental and somatic dysfunction of cervical region: Secondary | ICD-10-CM

## 2021-12-15 DIAGNOSIS — M25512 Pain in left shoulder: Secondary | ICD-10-CM

## 2021-12-15 DIAGNOSIS — M9908 Segmental and somatic dysfunction of rib cage: Secondary | ICD-10-CM | POA: Diagnosis not present

## 2021-12-15 DIAGNOSIS — M9902 Segmental and somatic dysfunction of thoracic region: Secondary | ICD-10-CM

## 2021-12-15 DIAGNOSIS — R079 Chest pain, unspecified: Secondary | ICD-10-CM | POA: Diagnosis not present

## 2021-12-15 DIAGNOSIS — R0789 Other chest pain: Secondary | ICD-10-CM

## 2021-12-15 MED ORDER — MELOXICAM 15 MG PO TABS: 15.0000 mg | ORAL_TABLET | Freq: Every day | ORAL | 0 refills | Status: DC

## 2021-12-15 NOTE — Telephone Encounter (Signed)
Location changed and reordered.

## 2021-12-15 NOTE — Patient Instructions (Addendum)
Good to see you - Start meloxicam 15 mg daily x2 weeks.  If still having pain after 2 weeks, complete 3rd-week of meloxicam. May use remaining meloxicam as needed once daily for pain control.  Do not to use additional NSAIDs while taking meloxicam.  May use Tylenol 500-1000 mg 2 to 3 times a day for breakthrough pain. 3-4 week follow up  

## 2021-12-15 NOTE — Telephone Encounter (Signed)
Can you change the order for Maynor Mwangi to [facility]? His insrance wont approve [facility]

## 2021-12-15 NOTE — Addendum Note (Signed)
Addended by: Maxie Barb on: 12/15/2021 03:21 PM   Modules accepted: Orders

## 2021-12-22 ENCOUNTER — Ambulatory Visit (HOSPITAL_BASED_OUTPATIENT_CLINIC_OR_DEPARTMENT_OTHER): Payer: BC Managed Care – PPO

## 2021-12-28 ENCOUNTER — Encounter: Payer: Self-pay | Admitting: Family Medicine

## 2021-12-28 ENCOUNTER — Ambulatory Visit: Payer: BC Managed Care – PPO | Admitting: Family Medicine

## 2021-12-28 VITALS — BP 136/83 | HR 65 | Temp 98.0°F | Wt 288.0 lb

## 2021-12-28 DIAGNOSIS — Z6841 Body Mass Index (BMI) 40.0 and over, adult: Secondary | ICD-10-CM

## 2021-12-28 DIAGNOSIS — I420 Dilated cardiomyopathy: Secondary | ICD-10-CM | POA: Diagnosis not present

## 2021-12-28 DIAGNOSIS — E119 Type 2 diabetes mellitus without complications: Secondary | ICD-10-CM | POA: Diagnosis not present

## 2021-12-28 DIAGNOSIS — E785 Hyperlipidemia, unspecified: Secondary | ICD-10-CM

## 2021-12-28 DIAGNOSIS — I10 Essential (primary) hypertension: Secondary | ICD-10-CM

## 2021-12-28 DIAGNOSIS — I5032 Chronic diastolic (congestive) heart failure: Secondary | ICD-10-CM | POA: Diagnosis not present

## 2021-12-28 DIAGNOSIS — E8881 Metabolic syndrome: Secondary | ICD-10-CM

## 2021-12-28 LAB — POCT GLYCOSYLATED HEMOGLOBIN (HGB A1C)
HbA1c POC (<> result, manual entry): 5 % (ref 4.0–5.6)
HbA1c, POC (controlled diabetic range): 5 % (ref 0.0–7.0)
HbA1c, POC (prediabetic range): 5 % — AB (ref 5.7–6.4)
Hemoglobin A1C: 5 % (ref 4.0–5.6)

## 2021-12-28 MED ORDER — FUROSEMIDE 20 MG PO TABS: 40.0000 mg | ORAL_TABLET | Freq: Every day | ORAL | 1 refills | Status: DC

## 2021-12-28 MED ORDER — LOSARTAN POTASSIUM 100 MG PO TABS: 100.0000 mg | ORAL_TABLET | Freq: Every day | ORAL | 1 refills | Status: DC

## 2021-12-28 MED ORDER — ATORVASTATIN CALCIUM 40 MG PO TABS: 40.0000 mg | ORAL_TABLET | Freq: Every day | ORAL | 3 refills | Status: DC

## 2021-12-28 NOTE — Progress Notes (Signed)
Patient ID: Darren Carlson, male  DOB: 27-Jun-1975, 46 y.o.   MRN: [id number] Patient Care Team    Relationship Specialty Notifications Start End  Ma Hillock, DO PCP - General Family Medicine  01/23/17   Gaye Pollack, MD Consulting Physician Cardiothoracic Surgery  05/03/17   Park Liter, MD Consulting Physician Cardiology  05/03/17     Chief Complaint  Patient presents with   Hypertension    Subjective:Darren Carlson is a 46 y.o. male present for [place] is a 46 y.o.  male present for follow-up on Diet controlled-diabetes/metabolic syndrome/morbid obesity: Diet controlled now. He has lost a great deal of weight.   He has been working very hard on his diet and exercise. Patient denies dizziness, hyperglycemic or hypoglycemic events. Patient denies numbness, tingling in the extremities or nonhealing wounds of feet.    hypertension/hyperlipidemia/acute combined systolic and diastolic heart failure/cardiomyopathy Pt reports he stopped amlodipine 5 mg daily and lipitor He continue  losartan 100 mg daily, Lasix 40 mg daily. Patient denies chest pain, shortness of breath, dizziness or lower extremity edema.  Coreg discontinued and metoprolol XL discontinued (by pt) secondary to side effects. He has established with cardiology and CTS. patient does not have a thoracic aortic aneurysm.  Repeat imaging ruled out aneurysm- artifact caused misdiagnosis.  Diet: Watching diet very closely, low-sodium Exercise: Routine exercise RF: Hypertension, hyperlipidemia, obesity, dilated cardiomyopathy, thoracic aortic aneurysm.  CTA 09/17/2017: CLINICAL DATA:  Follow-up of aneurysmal disease of the ascending thoracic aorta. EXAM: CT ANGIOGRAPHY CHEST WITH CONTRAST CONTRAST:  176mL ISOVUE-370 IOPAMIDOL (ISOVUE-370) INJECTION 76% COMPARISON:  03/22/2017 IMPRESSION: Previous aortic dimensions are felt to be overestimated due to motion artifact. The ascending thoracic aorta actually  measures 4 cm in greatest diameter, which is at the upper limits of normal.   CT anterior chest 03/12/2017: IMPRESSION: 1. Ascending thoracic aorta measures 4.7 x 4.4 cm.   2.  No demonstrable pulmonary embolus. 3. There is prominence of the main pulmonary outflow tract with a measured transverse diameter of 3.9 cm. This finding is concerning for underlying pulmonary arterial hypertension. 4.  No lung edema or consolidation. 5.  No evident thoracic adenopathy.  Echocardiogram 04/26/2017: LV EF: 55-65 % Study Conclusions - Left ventricle: The cavity size was normal. There was moderate   concentric hypertrophy. Systolic function was normal. The   estimated ejection fraction was in the range of 55% to 65%. Wall   motion was normal; there were no regional wall motion   abnormalities. The study is not technically sufficient to allow   evaluation of LV diastolic function. - Aortic valve: Valve area (VTI): 3.44 cm^2. Valve area (Vmax):   3.05 cm^2. Valve area (Vmean): 2.95 cm^2. Impressions: - Normal LVEF.   Moderate LVH.   Trace MR.     02/02/2021   10:27 AM 09/27/2020    9:40 AM 12/23/2019   10:52 AM 06/24/2018    9:15 AM 12/16/2017    9:38 AM  Depression screen PHQ 2/9  Decreased Interest 0 0 0 0 0  Down, Depressed, Hopeless 0 0 0 0 0  PHQ - 2 Score 0 0 0 0 0  Altered sleeping    0   Tired, decreased energy    0   Change in appetite    1   Feeling bad or failure about yourself     0   Trouble concentrating    0   Moving slowly or fidgety/restless    0  Suicidal thoughts    0   PHQ-9 Score    1   Difficult doing work/chores    Not difficult at all     Past Medical History:  Diagnosis Date   Asthma    Environmental allergies    GERD (gastroesophageal reflux disease)    Hemorrhoids    Obesity    Allergies  Allergen Reactions   Amoxicillin Rash   Penicillins Rash   Past Surgical History:  Procedure Laterality Date   WISDOM TOOTH EXTRACTION     Family History   Problem Relation Age of Onset   Colon cancer Maternal Grandmother 36       early death   Diabetes Paternal Grandmother    Kidney disease Paternal Grandmother    Diabetes Paternal Grandfather    Kidney disease Paternal Grandfather    Colon cancer Maternal Aunt 35   Colon cancer Cousin    Stomach cancer Neg Hx    Pancreatic cancer Neg Hx    Esophageal cancer Neg Hx    Social History   Socioeconomic History   Marital status: Married    Spouse name: Darren Carlson   Number of children: 2   Years of education: 16   Highest education level: Bachelor's degree (e.g., BA, AB, BS)  Occupational History   Occupation: Sports administrator  Tobacco Use   Smoking status: Never   Smokeless tobacco: Never  Vaping Use   Vaping Use: Never used  Substance and Sexual Activity   Alcohol use: Yes    Comment: occasional   Drug use: No   Sexual activity: Yes    Partners: Female    Birth control/protection: None  Other Topics Concern   Not on file  Social History Narrative   Married to Darren Carlson, 2 children.    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 Drinks caffeine.    Smoke alarm in the home. Wears seatbelt.    Feels safe in his relationships.    Social Determinants of Health   Financial Resource Strain: Low Risk  (07/12/2021)   Overall Financial Resource Strain (CARDIA)    Difficulty of Paying Living Expenses: Not hard at all  Food Insecurity: No Food Insecurity (07/12/2021)   Hunger Vital Sign    Worried About Running Out of Food in the Last Year: Never true    Ran Out of Food in the Last Year: Never true  Transportation Needs: No Transportation Needs (07/12/2021)   PRAPARE - Administrator, Civil Service (Medical): No    Lack of Transportation (Non-Medical): No  Physical Activity: Sufficiently Active (07/12/2021)   Exercise Vital Sign    Days of Exercise per Week: 4 days    Minutes of Exercise per Session: 90 min  Stress: No Stress Concern Present  (07/12/2021)   [employer] of Occupational Health - Occupational Stress Questionnaire    Feeling of Stress : Not at all  Social Connections: Socially Integrated (07/12/2021)   Social Connection and Isolation Panel [NHANES]    Frequency of Communication with Friends and Family: More than three times a week    Frequency of Social Gatherings with Friends and Family: Patient refused    Attends Religious Services: More than 4 times per year    Active Member of [employer] or Organizations: Yes    Attends Banker Meetings: More than 4 times per year    Marital Status: Married  Catering manager Violence: Not on file   Allergies as of 12/28/2021  Reactions   Amoxicillin Rash   Penicillins Rash        Medication List        Accurate as of December 28, 2021 10:33 AM. If you have any questions, ask your nurse or doctor.          atorvastatin 40 MG tablet Commonly known as: LIPITOR Take 1 tablet (40 mg total) by mouth daily.   colchicine 0.6 MG tablet 2 tabs PO at onset, then 1 tab 1 hour later. Then 1 tab PO QD until resolves.   furosemide 20 MG tablet Commonly known as: LASIX Take 2 tablets (40 mg total) by mouth daily.   losartan 100 MG tablet Commonly known as: COZAAR Take 1 tablet (100 mg total) by mouth daily.   meloxicam 15 MG tablet Commonly known as: MOBIC Take 1 tablet (15 mg total) by mouth daily.        Patient was never admitted.   ROS: 14 pt review of systems performed and negative (unless mentioned in an HPI)  Objective: BP 136/83   Pulse 65   Temp 98 F (36.7 C)   Wt 288 lb (130.6 kg)   SpO2 96%   BMI 41.32 kg/m   Physical Exam Vitals and nursing note reviewed.  Constitutional:      General: He is not in acute distress.    Appearance: Normal appearance. He is obese. He is not ill-appearing, toxic-appearing or diaphoretic.  HENT:     Head: Normocephalic and atraumatic.  Eyes:     General: No scleral icterus.       Right  eye: No discharge.        Left eye: No discharge.     Extraocular Movements: Extraocular movements intact.     Pupils: Pupils are equal, round, and reactive to light.  Cardiovascular:     Rate and Rhythm: Normal rate and regular rhythm.     Heart sounds: No murmur heard. Pulmonary:     Effort: Pulmonary effort is normal. No respiratory distress.     Breath sounds: Normal breath sounds. No wheezing, rhonchi or rales.  Musculoskeletal:     Cervical back: Neck supple.     Right lower leg: No edema.     Left lower leg: No edema.  Lymphadenopathy:     Cervical: No cervical adenopathy.  Skin:    General: Skin is warm and dry.     Coloration: Skin is not jaundiced or pale.     Findings: No rash.  Neurological:     Mental Status: He is alert and oriented to person, place, and time. Mental status is at baseline.  Psychiatric:        Mood and Affect: Mood normal.        Behavior: Behavior normal.        Thought Content: Thought content normal.        Judgment: Judgment normal.    Results for orders placed or performed in visit on 12/28/21 (from the past 24 hour(s))  POCT HgB A1C     Status: Abnormal   Collection Time: 12/28/21 10:12 AM  Result Value Ref Range   Hemoglobin A1C 5.0 4.0 - 5.6 %   HbA1c POC (<> result, manual entry) 5 4.0 - 5.6 %   HbA1c, POC (prediabetic range) 5 (A) 5.7 - 6.4 %   HbA1c, POC (controlled diabetic range) 5.0 0.0 - 7.0 %    Assessment/plan: Darren Carlson is a 46 y.o. male present for establishment visit with  Essential hypertension/dyspnea on exertion/morbid obesity diastolic heart failure (HCC)/hyperlipidemia goal less than 70 LDL stable - Ejection fraction improved, return to normal. LVH, trace MR. - Thoracic aortic aneurysm --> repeat imaging felt he did not have an  aneurysm and original was artifact.  - prior med: amlodipine, aldactone, coreg (SE), metoprolol (SE). - continue  losartan 100 mg daily (extra benefit w/ gout) - continue lasix 40 -  DIScontinue Aldactone 50 mg daily.   - Continue daily baby aspirin. - CONTINUE  atorvastatin 40 mg daily (he has refused 80 mg ) - Continue follow-up per routine schedule with cardiology   metabolic syndrome/morbid obesity: A1c 6.4--> 6.3 --> 5.8-->6.8>6.3>6.8> 5.9> 7.8> 5.7 > 6.7> 5.4>5.0  today.   - Continue to diet and exercise.   Return in about 24 weeks (around 06/14/2022) for cpe (40 min), Routine chronic condition follow-up.   Orders Placed This Encounter  Procedures   POCT HgB A1C   Meds ordered this encounter  Medications   atorvastatin (LIPITOR) 40 MG tablet    Sig: Take 1 tablet (40 mg total) by mouth daily.    Dispense:  90 tablet    Refill:  3   furosemide (LASIX) 20 MG tablet    Sig: Take 2 tablets (40 mg total) by mouth daily.    Dispense:  180 tablet    Refill:  1   losartan (COZAAR) 100 MG tablet    Sig: Take 1 tablet (100 mg total) by mouth daily.    Dispense:  90 tablet    Refill:  1      Note is dictated utilizing voice recognition software. Although note has been proof read prior to signing, occasional typographical errors still can be missed. If any questions arise, please do not hesitate to call for verification.   Electronically signed by: Felix Pacinienee Jahmar Mckelvy, DO [facility]

## 2021-12-28 NOTE — Patient Instructions (Addendum)
No follow-ups on file.        Great to see you today.  I have refilled the medication(s) we provide.   If labs were collected, we will inform you of lab results once received either by echart message or telephone call.   - echart message- for normal results that have been seen by the patient already.   - telephone call: abnormal results or if patient has not viewed results in their echart.    Vitals - 1 value per visit     Weight (lb) Height BMI  01/23/2017 396.25 (H)  5' 9.75" (1.772 m)  57.26 kg/m2   01/25/2017 392 (H)  5\' 10"  (1.778 m)  56.25 kg/m2    392 (H)  5\' 10"  (1.778 m)  56.25 kg/m2   01/29/2017 382 (H)   54.81 kg/m2   01/30/2017 382 (H)  5\' 10"  (1.778 m)  54.81 kg/m2   02/07/2017 383.04 (H)  5\' 10"  (1.778 m)  54.96 kg/m2   02/13/2017 384 (H)  5\' 10"  (1.778 m)  55.1 kg/m2   03/07/2017 382 (H)  5\' 10"  (1.778 m)  54.81 kg/m2   03/22/2017 378 (H)   54.24 kg/m2   04/11/2017 373 (H)  5\' 10"  (1.778 m)  53.52 kg/m2   05/01/2017 366.2 (H)  5\' 10"  (1.778 m)  52.54 kg/m2   05/16/2017 368.5 (H)  5\' 10"  (1.778 m)  52.87 kg/m2   05/29/2017 371.8 (H)  5\' 10"  (1.778 m)  53.35 kg/m2   09/10/2017 383.8 (H)  5\' 10"  (1.778 m)  55.07 kg/m2   12/16/2017 382 (H)  5\' 10"  (1.778 m)  54.81 kg/m2   12/18/2017 385 (H)  5\' 10"  (1.778 m)  55.24 kg/m2   06/24/2018 388.13 (H)  5\' 10"  (1.778 m)  55.69 kg/m2   11/05/2018 370 (H)  5\' 10"  (1.778 m)  53.09 kg/m2   12/25/2018 382.13 (H)  5\' 10"  (1.778 m)  54.83 kg/m2   12/31/2018 381 (H)  5\' 10"  (1.778 m)  54.67 kg/m2   04/07/2019 385 (H)  5\' 10"  (1.778 m)  55.24 kg/m2   08/14/2019 373.5 (H)  5\' 10"  (1.778 m)  53.59 kg/m2   12/23/2019 366 (H)  5\' 10"  (1.778 m)  52.52 kg/m2   09/27/2020 386 (H)  5\' 10"  (1.778 m)  55.39 kg/m2   01/05/2021 355 (H)  5\' 10"  (1.778 m)  50.94 kg/m2   02/02/2021 347 (H)  5\' 10"  (1.778 m)  49.79 kg/m2   07/13/2021 341 (H)  5\' 10"  (1.778 m)  48.93 kg/m2   10/26/2021 294  5\' 10"  (1.778 m)  42.18 kg/m2   11/07/2021 302 (H)  5\' 10"  (1.778 m)  43.33  kg/m2   11/30/2021 298  5\' 10"  (1.778 m)  42.76 kg/m2   12/15/2021 293  5\' 10"  (1.778 m)  42.04 kg/m2   12/28/2021 288   41.32 kg/m2

## 2022-01-07 ENCOUNTER — Other Ambulatory Visit: Payer: Self-pay | Admitting: Sports Medicine

## 2022-01-08 ENCOUNTER — Ambulatory Visit
Admission: RE | Admit: 2022-01-08 | Discharge: 2022-01-08 | Disposition: A | Discharge status: Home / Self Care | Payer: BC Managed Care – PPO | Source: Ambulatory Visit | Attending: Family Medicine | Admitting: Family Medicine

## 2022-01-08 DIAGNOSIS — R0789 Other chest pain: Secondary | ICD-10-CM

## 2022-01-08 DIAGNOSIS — M40204 Unspecified kyphosis, thoracic region: Secondary | ICD-10-CM | POA: Diagnosis not present

## 2022-01-08 DIAGNOSIS — R200 Anesthesia of skin: Secondary | ICD-10-CM | POA: Diagnosis not present

## 2022-01-08 DIAGNOSIS — R202 Paresthesia of skin: Secondary | ICD-10-CM | POA: Diagnosis not present

## 2022-01-08 DIAGNOSIS — M9902 Segmental and somatic dysfunction of thoracic region: Secondary | ICD-10-CM

## 2022-01-08 DIAGNOSIS — M25512 Pain in left shoulder: Secondary | ICD-10-CM

## 2022-01-08 DIAGNOSIS — M546 Pain in thoracic spine: Secondary | ICD-10-CM | POA: Diagnosis not present

## 2022-01-09 DIAGNOSIS — D235 Other benign neoplasm of skin of trunk: Secondary | ICD-10-CM | POA: Diagnosis not present

## 2022-01-09 DIAGNOSIS — L728 Other follicular cysts of the skin and subcutaneous tissue: Secondary | ICD-10-CM | POA: Diagnosis not present

## 2022-01-09 DIAGNOSIS — L821 Other seborrheic keratosis: Secondary | ICD-10-CM | POA: Diagnosis not present

## 2022-01-09 DIAGNOSIS — B351 Tinea unguium: Secondary | ICD-10-CM | POA: Diagnosis not present

## 2022-01-10 ENCOUNTER — Other Ambulatory Visit: Payer: Self-pay | Admitting: Sports Medicine

## 2022-01-10 ENCOUNTER — Telehealth: Payer: Self-pay | Admitting: Family Medicine

## 2022-01-10 NOTE — Telephone Encounter (Signed)
Please inform patient: His thoracic MRI did result with likely cause of his back and chest wall pain.  He has degeneration of the thoracic spine right along the levels he is having symptoms. He has a mild compression at T5, T6-T7 with moderate disc herniation with endplate spurring with possibly mild spinal stenosis at this level. I would recommend he continue to follow with Dr. Glennon Mac for recommendations.

## 2022-01-10 NOTE — Progress Notes (Unsigned)
    Benito Mccreedy D.[place] Phone: [phone]   Assessment and Plan:     There are no diagnoses linked to this encounter.  ***   Pertinent previous records reviewed include ***   Follow Up: ***     Subjective:   I, Jenna Routzahn, am serving as a Education administrator for Doctor Glennon Mac   Chief Complaint: thoracic and shoulder pain    HPI:    12/15/2021 Patient is a 46 year old male complaining of thoracic and shoulder pain. Patient states left sided chest and shoulder pain of 2 weeks duration.  He has had similar discomfort in the past and had been evaluated by cardio, which felt discomfort was MSK at that time.  Patient has been on a weight loss journey and has been dieting, as well as exercising more frequently.  He has lost close to 90 pounds over the last year.  The pain he is experiencing he points to his left pectoralis muscle group.  This pain is reproducible with movement.  He also is having some tingling that will go down the back of his arm to his elbow. Is able to replicate the pain down his left shoulder and back , is getting an MRI needs to make an appointment     01/11/2022 Patient states   Relevant Historical Information: Purposeful 100+ pound weight loss over the past year.  History of cardiomyopathy, hypertension  Additional pertinent review of systems negative.   Current Outpatient Medications:    atorvastatin (LIPITOR) 40 MG tablet, Take 1 tablet (40 mg total) by mouth daily., Disp: 90 tablet, Rfl: 3   colchicine 0.6 MG tablet, 2 tabs PO at onset, then 1 tab 1 hour later. Then 1 tab PO QD until resolves., Disp: 30 tablet, Rfl: 3   furosemide (LASIX) 20 MG tablet, Take 2 tablets (40 mg total) by mouth daily., Disp: 180 tablet, Rfl: 1   losartan (COZAAR) 100 MG tablet, Take 1 tablet (100 mg total) by mouth daily., Disp: 90 tablet, Rfl: 1   meloxicam (MOBIC) 15 MG tablet, Take 1 tablet (15  mg total) by mouth daily., Disp: 30 tablet, Rfl: 0   Objective:     There were no vitals filed for this visit.    There is no height or weight on file to calculate BMI.    Physical Exam:    ***   Electronically signed by:  Benito Mccreedy D.[facility] 7:41 AM 01/10/22

## 2022-01-11 ENCOUNTER — Ambulatory Visit: Payer: BC Managed Care – PPO | Admitting: Sports Medicine

## 2022-01-11 VITALS — BP 136/82 | HR 73 | Ht 70.0 in | Wt 295.0 lb

## 2022-01-11 DIAGNOSIS — M5134 Other intervertebral disc degeneration, thoracic region: Secondary | ICD-10-CM

## 2022-01-11 DIAGNOSIS — R079 Chest pain, unspecified: Secondary | ICD-10-CM | POA: Diagnosis not present

## 2022-01-11 NOTE — Telephone Encounter (Signed)
Spoke with patient regarding results/recommendations.  

## 2022-01-11 NOTE — Patient Instructions (Addendum)
Good to see you  Scapular HEP  As needed follow up

## 2022-02-14 ENCOUNTER — Other Ambulatory Visit: Payer: Self-pay

## 2022-02-15 ENCOUNTER — Encounter: Payer: Self-pay | Admitting: Cardiology

## 2022-02-15 ENCOUNTER — Ambulatory Visit: Payer: BC Managed Care – PPO | Attending: Cardiology | Admitting: Cardiology

## 2022-02-15 VITALS — BP 128/76 | HR 67 | Ht 70.0 in | Wt 284.8 lb

## 2022-02-15 DIAGNOSIS — I420 Dilated cardiomyopathy: Secondary | ICD-10-CM | POA: Diagnosis not present

## 2022-02-15 DIAGNOSIS — I10 Essential (primary) hypertension: Secondary | ICD-10-CM

## 2022-02-15 DIAGNOSIS — I7121 Aneurysm of the ascending aorta, without rupture: Secondary | ICD-10-CM | POA: Insufficient documentation

## 2022-02-15 DIAGNOSIS — I5032 Chronic diastolic (congestive) heart failure: Secondary | ICD-10-CM

## 2022-02-15 NOTE — Patient Instructions (Signed)
Medication Instructions:  Your physician recommends that you continue on your current medications as directed. Please refer to the Current Medication list given to you today.  *If you need a refill on your cardiac medications before your next appointment, please call your pharmacy*   Lab Work: NONE If you have labs (blood work) drawn today and your tests are completely normal, you will receive your results only by: MyChart Message (if you have MyChart) OR A paper copy in the mail If you have any lab test that is abnormal or we need to change your treatment, we will call you to review the results.   Testing/Procedures: Your physician has requested that you have an echocardiogram. Echocardiography is a painless test that uses sound waves to create images of your heart. It provides your doctor with information about the size and shape of your heart and how well your heart's chambers and valves are working. This procedure takes approximately one hour. There are no restrictions for this procedure. Please do NOT wear cologne, perfume, aftershave, or lotions (deodorant is allowed). Please arrive 15 minutes prior to your appointment time.    Follow-Up: At [facility], you and your health needs are our priority.  As part of our continuing mission to provide you with exceptional heart care, we have created designated Provider Care Teams.  These Care Teams include your primary Cardiologist (physician) and Advanced Practice Providers (APPs -  Physician Assistants and Nurse Practitioners) who all work together to provide you with the care you need, when you need it.  We recommend signing up for the patient portal called "MyChart".  Sign up information is provided on this After Visit Summary.  MyChart is used to connect with patients for Virtual Visits (Telemedicine).  Patients are able to view lab/test results, encounter notes, upcoming appointments, etc.  Non-urgent messages can be sent to your  provider as well.   To learn more about what you can do with MyChart, go to [email or web address].    Your next appointment:   1 year(s)  The format for your next appointment:   In Person  Provider:   Gypsy Balsam, MD    Other Instructions   Important Information About Sugar

## 2022-02-15 NOTE — Progress Notes (Signed)
Cardiology Office Note:    Date:  02/15/2022   ID:  Darren Carlson, DOB [birth year], MRN [id number]  PCP:  Natalia Leatherwood, DO  Cardiologist:  Gypsy Balsam, MD    Referring MD: Natalia Leatherwood, DO   Chief Complaint  Patient presents with   Follow-up    History of Present Illness:    Darren Carlson is a 46 y.o. male with past medical history significant for essential hypertension, morbid obesity, GERD, he did have CT of the chest showed enlargement of the aorta eventually end up having referral to CT surgeons CT has been repeated looks like the first CT was overreading size of the aorta probably because of motion artifact.  Last assessment of his aortic was by echocardiogram done in 2021 showing ascending aortic size of 41 mm. He is in my office today for follow-up.  Overall he is doing very well.  He denies have any chest pain, tightness pressure burning squeezing the chest.  He exercised on the regular basis he lost more than 100 pounds and feels dramatically better, I congratulated him for it.  However I landed the way he lost his weight he changed his diet but also exercise aggressively doing some weightlifting I cautioned him against that because of enlargement of the aorta.  So I prefer him to do more repetition less weight running and heavy weightlifting.  Cardiac wise doing well  Past Medical History:  Diagnosis Date   Asthma    Environmental allergies    GERD (gastroesophageal reflux disease)    Hemorrhoids    Obesity     Past Surgical History:  Procedure Laterality Date   WISDOM TOOTH EXTRACTION      Current Medications: Current Meds  Medication Sig   atorvastatin (LIPITOR) 40 MG tablet Take 1 tablet (40 mg total) by mouth daily.   furosemide (LASIX) 20 MG tablet Take 2 tablets (40 mg total) by mouth daily.   losartan (COZAAR) 100 MG tablet Take 1 tablet (100 mg total) by mouth daily.     Allergies:   Amoxicillin and Penicillins   Social History   Socioeconomic  History   Marital status: Married    Spouse name: Jaymes Graff   Number of children: 2   Years of education: 16   Highest education level: Bachelor's degree (e.g., BA, AB, BS)  Occupational History   Occupation: Sports administrator  Tobacco Use   Smoking status: Never   Smokeless tobacco: Never  Vaping Use   Vaping Use: Never used  Substance and Sexual Activity   Alcohol use: Yes    Comment: occasional   Drug use: No   Sexual activity: Yes    Partners: Female    Birth control/protection: None  Other Topics Concern   Not on file  Social History Narrative   Married to Olla, 2 children.    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 Drinks caffeine.    Smoke alarm in the home. Wears seatbelt.    Feels safe in his relationships.    Social Determinants of Health   Financial Resource Strain: Low Risk  (07/12/2021)   Overall Financial Resource Strain (CARDIA)    Difficulty of Paying Living Expenses: Not hard at all  Food Insecurity: No Food Insecurity (07/12/2021)   Hunger Vital Sign    Worried About Running Out of Food in the Last Year: Never true    Ran Out of Food in the Last Year: Never true  Transportation Needs: No  Transportation Needs (07/12/2021)   PRAPARE - Hydrologist (Medical): No    Lack of Transportation (Non-Medical): No  Physical Activity: Sufficiently Active (07/12/2021)   Exercise Vital Sign    Days of Exercise per Week: 4 days    Minutes of Exercise per Session: 90 min  Stress: No Stress Concern Present (07/12/2021)   [place]    Feeling of Stress : Not at all  Social Connections: [place] (07/12/2021)   Social Connection and Isolation Panel [NHANES]    Frequency of Communication with Friends and Family: More than three times a week    Frequency of Social Gatherings with Friends and Family: Patient refused    Attends Religious  Services: More than 4 times per year    Active Member of [employer] or Organizations: Yes    Attends Music therapist: More than 4 times per year    Marital Status: Married     Family History: The patient's family history includes Colon cancer in his cousin; Colon cancer (age of onset: 32) in his maternal aunt; Colon cancer (age of onset: 40) in his maternal grandmother; Diabetes in his paternal grandfather and paternal grandmother; Kidney disease in his paternal grandfather and paternal grandmother. There is no history of Stomach cancer, Pancreatic cancer, or Esophageal cancer. ROS:   Please see the history of present illness.    All 14 point review of systems negative except as described per history of present illness  EKGs/Labs/Other Studies Reviewed:      Recent Labs: 07/13/2021: ALT 21; BUN 16; Creatinine, Ser 1.12; Hemoglobin 15.1; Platelets 178.0; Potassium 4.0; Sodium 138; TSH 1.27  Recent Lipid Panel    Component Value Date/Time   CHOL 155 02/02/2021 1107   TRIG 107.0 02/02/2021 1107   HDL 45.00 02/02/2021 1107   CHOLHDL 3 02/02/2021 1107   VLDL 21.4 02/02/2021 1107   LDLCALC 89 02/02/2021 1107   LDLDIRECT 157 (H) 01/23/2017 1628    Physical Exam:    VS:  BP 128/76 (BP Location: Left Arm)   Pulse 67   Ht 5\' 10"  (1.778 m)   Wt 284 lb 12.8 oz (129.2 kg)   SpO2 98%   BMI 40.86 kg/m     Wt Readings from Last 3 Encounters:  02/15/22 284 lb 12.8 oz (129.2 kg)  01/11/22 295 lb (133.8 kg)  12/28/21 288 lb (130.6 kg)     GEN:  Well nourished, well developed in no acute distress HEENT: Normal NECK: No JVD; No carotid bruits LYMPHATICS: No lymphadenopathy CARDIAC: RRR, no murmurs, no rubs, no gallops RESPIRATORY:  Clear to auscultation without rales, wheezing or rhonchi  ABDOMEN: Soft, non-tender, non-distended MUSCULOSKELETAL:  No edema; No deformity  SKIN: Warm and dry LOWER EXTREMITIES: no swelling NEUROLOGIC:  Alert and oriented x 3 PSYCHIATRIC:   Normal affect   ASSESSMENT:    1. Primary hypertension   2. Chronic diastolic heart failure ([place])   3. Dilated cardiomyopathy ([place])   4. Aneurysm of ascending aorta without rupture ([facility])    PLAN:    In order of problems listed above:  Essential hypertension blood pressure well controlled continue present management. Dyslipidemia I did review his K PN which show me his LDL of 89 HDL 45 this data is from 02/02/2021 which is more than year ago, I will repeat his fasting lipid profile History of cardiomyopathy, last echocardiogram showed preserved ejection fraction. Enlargement of the ascending aorta measuring only  41 mm.  We will repeat echocardiogram and recheck to size.   Medication Adjustments/Labs and Tests Ordered: Current medicines are reviewed at length with the patient today.  Concerns regarding medicines are outlined above.  Orders Placed This Encounter  Procedures   EKG 12-Lead   ECHOCARDIOGRAM COMPLETE   Medication changes: No orders of the defined types were placed in this encounter.   Signed, Park Liter, MD, [facility] 02/15/2022 12:07 PM    [place]

## 2022-03-16 ENCOUNTER — Ambulatory Visit (HOSPITAL_BASED_OUTPATIENT_CLINIC_OR_DEPARTMENT_OTHER)
Admission: RE | Admit: 2022-03-16 | Discharge: 2022-03-16 | Disposition: A | Discharge status: Home / Self Care | Payer: BC Managed Care – PPO | Source: Ambulatory Visit | Attending: Cardiology | Admitting: Cardiology

## 2022-03-16 DIAGNOSIS — I5032 Chronic diastolic (congestive) heart failure: Secondary | ICD-10-CM | POA: Insufficient documentation

## 2022-03-16 DIAGNOSIS — I420 Dilated cardiomyopathy: Secondary | ICD-10-CM | POA: Insufficient documentation

## 2022-03-16 DIAGNOSIS — I10 Essential (primary) hypertension: Secondary | ICD-10-CM | POA: Insufficient documentation

## 2022-03-16 MED ORDER — PERFLUTREN LIPID MICROSPHERE
1.0000 mL | INTRAVENOUS | Status: AC | PRN
  Administered 2022-03-16: 4 mL via INTRAVENOUS

## 2022-03-17 LAB — ECHOCARDIOGRAM COMPLETE
AR max vel: 3.46 cm2
AV Area VTI: 3.64 cm2
AV Area mean vel: 3.48 cm2
AV Mean grad: 2.5 mmHg
AV Peak grad: 5.4 mmHg
Ao pk vel: 1.17 m/s
Area-P 1/2: 2.97 cm2
S' Lateral: 3.4 cm

## 2022-03-23 ENCOUNTER — Telehealth: Payer: Self-pay | Admitting: Cardiology

## 2022-03-23 NOTE — Telephone Encounter (Signed)
LVM regarding ECHO results.

## 2022-03-23 NOTE — Telephone Encounter (Signed)
Patient would like a call back to discuss test results.

## 2022-04-17 DIAGNOSIS — L720 Epidermal cyst: Secondary | ICD-10-CM | POA: Diagnosis not present

## 2022-04-17 DIAGNOSIS — L918 Other hypertrophic disorders of the skin: Secondary | ICD-10-CM | POA: Diagnosis not present

## 2022-05-01 DIAGNOSIS — L720 Epidermal cyst: Secondary | ICD-10-CM | POA: Diagnosis not present

## 2022-05-28 ENCOUNTER — Ambulatory Visit (INDEPENDENT_AMBULATORY_CARE_PROVIDER_SITE_OTHER): Payer: BC Managed Care – PPO

## 2022-05-28 ENCOUNTER — Ambulatory Visit: Payer: BC Managed Care – PPO | Admitting: Sports Medicine

## 2022-05-28 VITALS — Ht 70.0 in | Wt 285.0 lb

## 2022-05-28 DIAGNOSIS — M25512 Pain in left shoulder: Secondary | ICD-10-CM

## 2022-05-28 DIAGNOSIS — M50223 Other cervical disc displacement at C6-C7 level: Secondary | ICD-10-CM | POA: Diagnosis not present

## 2022-05-28 DIAGNOSIS — M5412 Radiculopathy, cervical region: Secondary | ICD-10-CM

## 2022-05-28 DIAGNOSIS — R29898 Other symptoms and signs involving the musculoskeletal system: Secondary | ICD-10-CM | POA: Diagnosis not present

## 2022-05-28 MED ORDER — MELOXICAM 15 MG PO TABS: 15.0000 mg | ORAL_TABLET | Freq: Every day | ORAL | 0 refills | Status: DC

## 2022-05-28 MED ORDER — CYCLOBENZAPRINE HCL 5 MG PO TABS: 5.0000 mg | ORAL_TABLET | Freq: Every day | ORAL | 0 refills | Status: DC

## 2022-05-28 NOTE — Patient Instructions (Addendum)
Good to see you  - Start meloxicam 15 mg daily x2 weeks.  If still having pain after 2 weeks, complete 3rd-week of meloxicam. May use remaining meloxicam as needed once daily for pain control.  Do not to use additional NSAIDs while taking meloxicam.  May use Tylenol [id number] mg 2 to 3 times a day for breakthrough pain. Flexeril 5-10 mg nightly as needed for muscle spasm  HEP  3 week follow up

## 2022-05-28 NOTE — Progress Notes (Signed)
Darren Carlson D.[place] Phone: [phone]   Assessment and Plan:     1. Left arm weakness 2. Left shoulder pain, unspecified chronicity 3. C6 radiculopathy -Acute, mild improvement, initial sports medicine visit - Most consistent with a C6 radiculopathy based on patient's HPI and physical exam.  I suspect his physical activity led to a strain of musculature in thoracic girdle and irritation of C6 nerve.  Cannot rule out cervical etiology of patient's symptoms, though I believe a more lateralized cause is more likely at this time - X-ray obtained in clinic to rule out cervical cause of symptoms.  Monitor rotation: No acute fracture or vertebral collapse.  Decreased disc space between C5-6 and C6-7 with anterior vertebral spurring on C5-7 - Start meloxicam 15 mg daily x2 weeks.  If still having pain after 2 weeks, complete 3rd-week of meloxicam. May use remaining meloxicam as needed once daily for pain control.  Do not to use additional NSAIDs while taking meloxicam.  May use Tylenol [phone] mg 2 to 3 times a day for breakthrough pain. - Start Flexeril 5 to 10 mg nightly as needed for muscle spasms - Start HEP targeting thoracic outlet, trapezius  Other orders - meloxicam (MOBIC) 15 MG tablet; Take 1 tablet (15 mg total) by mouth daily. - cyclobenzaprine (FLEXERIL) 5 MG tablet; Take 1 tablet (5 mg total) by mouth at bedtime.    Pertinent previous records reviewed include none   Follow Up: 3 to 4 weeks for reevaluation.  If no improvement or worsening of symptoms, could discuss gabapentin versus physical therapy versus advanced imaging   Subjective:   I, Darren Carlson, am serving as a Education administrator for Doctor Darren Carlson  Chief Complaint: left arm pain   HPI:   12/15/2021 Patient is a 47 year old male complaining of thoracic and shoulder pain. Patient states left sided chest and shoulder pain of 2 weeks  duration.  He has had similar discomfort in the past and had been evaluated by cardio, which felt discomfort was MSK at that time.  Patient has been on a weight loss journey and has been dieting, as well as exercising more frequently.  He has lost close to 90 pounds over the last year.  The pain he is experiencing he points to his left pectoralis muscle group.  This pain is reproducible with movement.  He also is having some tingling that will go down the back of his arm to his elbow. Is able to replicate the pain down his left shoulder and back , is getting an MRI needs to make an appointment     01/11/2022 Patient states that he is doing the same     05/28/2022 Patient is a 47 year old male complaining of left arm pain. Patient states he was moving a couch and he felt something rested and that got better, Monday he went to a workout core/back he is flared he was doing a lot of shoulder movements and states he thinks he has a pinched nerve , has numbness in his fingers, pain down the whole arm , he has a TTP spot on his left shoulder that radiates down the arm , has pain when sleeping , using and swiping on his phone , has been taking meloxicam, advil did not happen, no decrease ROM ,  Relevant Historical Information: Hypertension  Additional pertinent review of systems negative.   Current Outpatient Medications:    atorvastatin (  LIPITOR) 40 MG tablet, Take 1 tablet (40 mg total) by mouth daily., Disp: 90 tablet, Rfl: 3   cyclobenzaprine (FLEXERIL) 5 MG tablet, Take 1 tablet (5 mg total) by mouth at bedtime., Disp: 30 tablet, Rfl: 0   furosemide (LASIX) 20 MG tablet, Take 2 tablets (40 mg total) by mouth daily., Disp: 180 tablet, Rfl: 1   losartan (COZAAR) 100 MG tablet, Take 1 tablet (100 mg total) by mouth daily., Disp: 90 tablet, Rfl: 1   meloxicam (MOBIC) 15 MG tablet, Take 1 tablet (15 mg total) by mouth daily., Disp: 30 tablet, Rfl: 0   Objective:     Vitals:   05/28/22 0856  Weight:  285 lb (129.3 kg)  Height: 5' 10"$  (1.778 m)      Body mass index is 40.89 kg/m.    Physical Exam:    Neck Exam: Cervical Spine- Posture normal Skin- normal, intact  Neurological-  Strength-  Right Left   Deltoid (C5) 5/5 5/5  Bicep/Brachioradialis (C5/6) 5/5  5/5  Wrist Extension (C6) 5/5 5/5  Tricep (C7) 5/5 5/5  Wrist Flexion (C7) 5/5 5/5  Grip (C8) 5/5 5/5  Finger Abduction (T1) 5/5 5/5   Sensation: Moderately decreased sensation to light touch in second digit on left on palmar and dorsal surfaces, with mildly decreased sensation to light touch in third digit on left on palmar and dorsal surfaces.  Otherwise intact to light touch in upper extremities bilaterally, though patient described a radiating sensation from second and third fingers up lateral forearm and along triceps, though not reproducible on my physical exam  Spurling's:  negative bilaterally Neck ROM: Full active ROM TTP: Left levator, rhomboid, trapezius NTTP: cervical spinous processes, cervical paraspinal,    Electronically signed by:  Darren Carlson D.[facility] 9:58 AM 05/28/22

## 2022-05-29 ENCOUNTER — Ambulatory Visit: Payer: BC Managed Care – PPO | Admitting: Family Medicine

## 2022-06-05 ENCOUNTER — Telehealth: Payer: Self-pay

## 2022-06-05 ENCOUNTER — Encounter: Payer: Self-pay | Admitting: Cardiology

## 2022-06-05 NOTE — Telephone Encounter (Signed)
Spoke with pt regarding Echo results from 03-2022. Pt verbalized understanding and will follow up as directed in 1 year.

## 2022-06-14 ENCOUNTER — Encounter: Payer: BC Managed Care – PPO | Admitting: Family Medicine

## 2022-06-14 NOTE — Progress Notes (Signed)
    Pt arrived >11 min late for appt. There is no additional openings today to be able to work him back in to today. Pt will need to reschedule.   Pt reminded of late policy.

## 2022-06-19 NOTE — Progress Notes (Signed)
Darren Carlson D.[place] Phone: [phone]   Assessment and Plan:     1. Left arm weakness 2. Left shoulder pain, unspecified chronicity 3. C6 radiculopathy -Subacute, mild improvement, subsequent visit - Patient has had "40 to 50%" improvement with relative rest and 4-week course of meloxicam, however he continues to experience numbness and tingling into left second digit as well as weakness with triceps - Due to weakness in triceps, concern for C6 versus C7 radiculopathy, degenerative changes seen on x-ray, I feel it is necessary to proceed with cervical spine MRI at this time - Continue HEP - Discontinue meloxicam and use remainder as needed - MR CERVICAL SPINE WO CONTRAST; Future    Pertinent previous records reviewed include none   Follow Up: 3 days after MRI to review results and discuss treatment plan   Subjective:   I, Pincus Badder, am serving as a Education administrator for Doctor Glennon Mac   Chief Complaint: left arm pain    HPI:    12/15/2021 Patient is a 47 year old male complaining of thoracic and shoulder pain. Patient states left sided chest and shoulder pain of 2 weeks duration.  He has had similar discomfort in the past and had been evaluated by cardio, which felt discomfort was MSK at that time.  Patient has been on a weight loss journey and has been dieting, as well as exercising more frequently.  He has lost close to 90 pounds over the last year.  The pain he is experiencing he points to his left pectoralis muscle group.  This pain is reproducible with movement.  He also is having some tingling that will go down the back of his arm to his elbow. Is able to replicate the pain down his left shoulder and back , is getting an MRI needs to make an appointment     01/11/2022 Patient states that he is doing the same      05/28/2022 Patient is a 47 year old male complaining of left arm pain.  Patient states he was moving a couch and he felt something rested and that got better, Monday he went to a workout core/back he is flared he was doing a lot of shoulder movements and states he thinks he has a pinched nerve , has numbness in his fingers, pain down the whole arm , he has a TTP spot on his left shoulder that radiates down the arm , has pain when sleeping , using and swiping on his phone , has been taking meloxicam, advil did not happen, no decrease ROM ,  06/26/2022 Patient states he is okay     Relevant Historical Information: Hypertension  Additional pertinent review of systems negative.   Current Outpatient Medications:    atorvastatin (LIPITOR) 40 MG tablet, Take 1 tablet (40 mg total) by mouth daily., Disp: 90 tablet, Rfl: 3   cyclobenzaprine (FLEXERIL) 5 MG tablet, Take 1 tablet (5 mg total) by mouth at bedtime., Disp: 30 tablet, Rfl: 0   furosemide (LASIX) 20 MG tablet, Take 2 tablets (40 mg total) by mouth daily., Disp: 180 tablet, Rfl: 1   losartan (COZAAR) 100 MG tablet, Take 1 tablet (100 mg total) by mouth daily., Disp: 90 tablet, Rfl: 1   meloxicam (MOBIC) 15 MG tablet, Take 1 tablet (15 mg total) by mouth daily., Disp: 30 tablet, Rfl: 0   Objective:     Vitals:   06/26/22 0919  BP: 130/80  Pulse: 72  SpO2: 96%  Weight: 299 lb (135.6 kg)  Height: 5\' 10"  (1.778 m)      Body mass index is 42.9 kg/m.    Physical Exam:    Neck Exam: Cervical Spine- Posture normal Skin- normal, intact   Neurological-  Strength-       Right Left  Deltoid (C5)    5/5 5/5 Bicep/Brachioradialis (C5/6)  5/5  5/5 Wrist Extension (C6)   5/5 5/5 Tricep (C7)    5/5 4/5 Wrist Flexion (C7)   5/5 5/5 Grip (C8)    5/5 5/5 Finger Abduction (T1)  5/5 5/5   Sensation: Moderately decreased sensation to light touch in second digit on left on palmar and dorsal surfaces, with mildly decreased sensation to light touch in third digit on left on palmar and dorsal surfaces.  Otherwise  intact to light touch in upper extremities bilaterally, though patient described a radiating sensation from second and third fingers up lateral forearm and along triceps, though not reproducible on my physical exam   Spurling's:  negative bilaterally Neck ROM: Full active ROM TTP: Left levator, rhomboid, trapezius NTTP: cervical spinous processes, cervical paraspinal,     Electronically signed by:  Darren Carlson D.[facility] 10:03 AM 06/26/22

## 2022-06-24 ENCOUNTER — Other Ambulatory Visit: Payer: Self-pay | Admitting: Sports Medicine

## 2022-06-26 ENCOUNTER — Ambulatory Visit: Payer: BC Managed Care – PPO | Admitting: Sports Medicine

## 2022-06-26 VITALS — BP 130/80 | HR 72 | Ht 70.0 in | Wt 299.0 lb

## 2022-06-26 DIAGNOSIS — M5412 Radiculopathy, cervical region: Secondary | ICD-10-CM | POA: Diagnosis not present

## 2022-06-26 DIAGNOSIS — M25512 Pain in left shoulder: Secondary | ICD-10-CM

## 2022-06-26 DIAGNOSIS — R29898 Other symptoms and signs involving the musculoskeletal system: Secondary | ICD-10-CM

## 2022-06-26 NOTE — Patient Instructions (Addendum)
Good to see you  MRI referral  Follow up 3 days after to discuss results  Continue HEP  Stop daily meloxicam and use remainder as needed

## 2022-07-10 ENCOUNTER — Ambulatory Visit (INDEPENDENT_AMBULATORY_CARE_PROVIDER_SITE_OTHER): Payer: BC Managed Care – PPO

## 2022-07-10 DIAGNOSIS — M25512 Pain in left shoulder: Secondary | ICD-10-CM

## 2022-07-10 DIAGNOSIS — M542 Cervicalgia: Secondary | ICD-10-CM

## 2022-07-10 DIAGNOSIS — R202 Paresthesia of skin: Secondary | ICD-10-CM

## 2022-07-10 DIAGNOSIS — M5412 Radiculopathy, cervical region: Secondary | ICD-10-CM

## 2022-07-10 DIAGNOSIS — R29898 Other symptoms and signs involving the musculoskeletal system: Secondary | ICD-10-CM | POA: Diagnosis not present

## 2022-07-10 DIAGNOSIS — M4802 Spinal stenosis, cervical region: Secondary | ICD-10-CM | POA: Diagnosis not present

## 2022-07-12 NOTE — Progress Notes (Signed)
Darren Carlson D.[name] [address] [place] [address] Phone: [phone]   Assessment and Plan:     1. Left arm weakness 2. C7 radiculopathy 3. DDD (degenerative disc disease), thoracic 4. Somatic dysfunction of cervical region - Chronic with exacerbation, subsequent visit - Reviewed MRI with patient showing central to left subarticular disc extrusion with inferior migration at C6-7 with query left C7 radiculitis.  Patient also had moderate to severe bilateral C6 foraminal narrowing and mild degenerative changes at C4-5 and C7-T1 - Patient's presentation, symptoms, physical exam are consistent with findings of MRI.  Patient elects for epidural CSI left-sided C6-7.  Order placed today - May continue Tylenol for day-to-day pain relief - DG INJECT DIAG/THERA/INC NEEDLE/CATH/PLC EPI/CERV/THOR W/IMG; Future    Pertinent previous records reviewed include C-spine MRI 07/10/2022   Follow Up: 2 weeks after epidural CSI to review benefit   Subjective:   I, Darren Carlson, am serving as a Neurosurgeon for Doctor Richardean Sale   Chief Complaint: left arm pain    HPI:    12/15/2021 Patient is a 47 year old male complaining of thoracic and shoulder pain. Patient states left sided chest and shoulder pain of 2 weeks duration.  He has had similar discomfort in the past and had been evaluated by cardio, which felt discomfort was MSK at that time.  Patient has been on a weight loss journey and has been dieting, as well as exercising more frequently.  He has lost close to 90 pounds over the last year.  The pain he is experiencing he points to his left pectoralis muscle group.  This pain is reproducible with movement.  He also is having some tingling that will go down the back of his arm to his elbow. Is able to replicate the pain down his left shoulder and back , is getting an MRI needs to make an appointment     01/11/2022 Patient states that he is doing the  same      05/28/2022 Patient is a 47 year old male complaining of left arm pain. Patient states he was moving a couch and he felt something rested and that got better, Monday he went to a workout core/back he is flared he was doing a lot of shoulder movements and states he thinks he has a pinched nerve , has numbness in his fingers, pain down the whole arm , he has a TTP spot on his left shoulder that radiates down the arm , has pain when sleeping , using and swiping on his phone , has been taking meloxicam, advil did not happen, no decrease ROM ,   06/26/2022 Patient states he is okay    07/13/2022 Patient states that he is good same ole same, he can notice more or less tingling when he adjust    Relevant Historical Information: Hypertension  Additional pertinent review of systems negative.   Current Outpatient Medications:    atorvastatin (LIPITOR) 40 MG tablet, Take 1 tablet (40 mg total) by mouth daily., Disp: 90 tablet, Rfl: 3   cyclobenzaprine (FLEXERIL) 5 MG tablet, Take 1 tablet (5 mg total) by mouth at bedtime., Disp: 30 tablet, Rfl: 0   furosemide (LASIX) 20 MG tablet, Take 2 tablets (40 mg total) by mouth daily., Disp: 180 tablet, Rfl: 1   losartan (COZAAR) 100 MG tablet, Take 1 tablet (100 mg total) by mouth daily., Disp: 90 tablet, Rfl: 1   meloxicam (MOBIC) 15 MG tablet, Take 1  tablet (15 mg total) by mouth daily., Disp: 30 tablet, Rfl: 0   Objective:     Vitals:   07/13/22 0938  BP: 118/78  Pulse: 66  SpO2: 99%  Weight: (!) 305 lb (138.3 kg)  Height: 5\' 10"  (1.778 m)      Body mass index is 43.76 kg/m.    Physical Exam:    Neck Exam: Cervical Spine- Posture normal Skin- normal, intact   Neurological-  Strength-                                                 Right Left  Deltoid (C5)                              5/5 5/5 Bicep/Brachioradialis (C5/6)   5/5  5/5 Wrist Extension (C6)               5/5 5/5 Tricep (C7)                              5/5 4/5 Wrist  Flexion (C7)                   5/5 5/5 Grip (C8)                                 5/5 5/5 Finger Abduction (T1)             5/5 5/5   Sensation: Moderately decreased sensation to light touch in second digit on left on palmar and dorsal surfaces, with mildly decreased sensation to light touch in third digit on left on palmar and dorsal surfaces.  Otherwise intact to light touch in upper extremities bilaterally, though patient described a radiating sensation from second and third fingers up lateral forearm and along triceps, though not reproducible on my physical exam   Spurling's:  negative bilaterally Neck ROM: Full active ROM TTP: Left levator, rhomboid, trapezius NTTP: cervical spinous processes, cervical paraspinal,     Electronically signed by:  Darren Carlson D.[name] 10:40 AM 07/13/22

## 2022-07-13 ENCOUNTER — Ambulatory Visit: Payer: BC Managed Care – PPO | Admitting: Sports Medicine

## 2022-07-13 VITALS — BP 118/78 | HR 66 | Ht 70.0 in | Wt 305.0 lb

## 2022-07-13 DIAGNOSIS — M5134 Other intervertebral disc degeneration, thoracic region: Secondary | ICD-10-CM | POA: Diagnosis not present

## 2022-07-13 DIAGNOSIS — M5412 Radiculopathy, cervical region: Secondary | ICD-10-CM

## 2022-07-13 DIAGNOSIS — R29898 Other symptoms and signs involving the musculoskeletal system: Secondary | ICD-10-CM | POA: Diagnosis not present

## 2022-07-13 DIAGNOSIS — M9901 Segmental and somatic dysfunction of cervical region: Secondary | ICD-10-CM

## 2022-07-13 NOTE — Patient Instructions (Addendum)
Good to see you  Epidural referral left C6-C7 Recommend zone 2 training for weight loss  Follow up 2 weeks after injection  to discuss results

## 2022-07-18 ENCOUNTER — Ambulatory Visit: Payer: BC Managed Care – PPO | Admitting: Family Medicine

## 2022-07-18 ENCOUNTER — Encounter: Payer: Self-pay | Admitting: Family Medicine

## 2022-07-18 VITALS — BP 143/88 | HR 72 | Temp 97.6°F | Wt 294.2 lb

## 2022-07-18 DIAGNOSIS — E785 Hyperlipidemia, unspecified: Secondary | ICD-10-CM

## 2022-07-18 DIAGNOSIS — I420 Dilated cardiomyopathy: Secondary | ICD-10-CM

## 2022-07-18 DIAGNOSIS — I5032 Chronic diastolic (congestive) heart failure: Secondary | ICD-10-CM | POA: Diagnosis not present

## 2022-07-18 DIAGNOSIS — I10 Essential (primary) hypertension: Secondary | ICD-10-CM | POA: Diagnosis not present

## 2022-07-18 LAB — LIPID PANEL
Cholesterol: 163 mg/dL (ref 0–200)
HDL: 67.4 mg/dL (ref >39.00)
LDL Cholesterol: 84 mg/dL (ref 0–99)
NonHDL: 95.5
Total CHOL/HDL Ratio: 2
Triglycerides: 57 mg/dL (ref 0.0–149.0)
VLDL: 11.4 mg/dL (ref 0.0–40.0)

## 2022-07-18 LAB — COMPREHENSIVE METABOLIC PANEL
ALT: 25 U/L (ref 0–53)
AST: 22 U/L (ref 0–37)
Albumin: 4.8 g/dL (ref 3.5–5.2)
Alkaline Phosphatase: 41 U/L (ref 39–117)
BUN: 16 mg/dL (ref 6–23)
CO2: 28 mEq/L (ref 19–32)
Calcium: 9.5 mg/dL (ref 8.4–10.5)
Chloride: 102 mEq/L (ref 96–112)
Creatinine, Ser: 1.05 mg/dL (ref 0.40–1.50)
GFR: 85.65 mL/min (ref >60.00)
Glucose, Bld: 118 mg/dL — ABNORMAL HIGH (ref 70–99)
Potassium: 3.8 mEq/L (ref 3.5–5.1)
Sodium: 140 mEq/L (ref 135–145)
Total Bilirubin: 2.5 mg/dL — ABNORMAL HIGH (ref 0.2–1.2)
Total Protein: 7 g/dL (ref 6.0–8.3)

## 2022-07-18 LAB — CBC
HCT: 46.9 % (ref 39.0–52.0)
Hemoglobin: 16.2 g/dL (ref 13.0–17.0)
MCHC: 34.5 g/dL (ref 30.0–36.0)
MCV: 84.3 fl (ref 78.0–100.0)
Platelets: 165 10*3/uL (ref 150.0–400.0)
RBC: 5.57 Mil/uL (ref 4.22–5.81)
RDW: 13.7 % (ref 11.5–15.5)
WBC: 6.2 10*3/uL (ref 4.0–10.5)

## 2022-07-18 MED ORDER — FUROSEMIDE 20 MG PO TABS: 40.0000 mg | ORAL_TABLET | Freq: Every day | ORAL | 1 refills | Status: DC

## 2022-07-18 MED ORDER — LOSARTAN POTASSIUM 100 MG PO TABS: 100.0000 mg | ORAL_TABLET | Freq: Every day | ORAL | 1 refills | Status: DC

## 2022-07-18 MED ORDER — ATORVASTATIN CALCIUM 40 MG PO TABS: 40.0000 mg | ORAL_TABLET | Freq: Every day | ORAL | 3 refills | Status: DC

## 2022-07-18 NOTE — Progress Notes (Signed)
Patient ID: Darren Carlson, male  DOB: 1975-09-27, 47 y.o.   MRN: [id number] Patient Care Team    Relationship Specialty Notifications Start End  Natalia Leatherwood, DO PCP - General Family Medicine  01/23/17   Alleen Borne, MD Consulting Physician Cardiothoracic Surgery  05/03/17   Georgeanna Lea, MD Consulting Physician Cardiology  05/03/17     Chief Complaint  Patient presents with   Hypertension    CMC; pt is fasting    Subjective:Darren Carlson is a 47 y.o. male present for [facility] Shoji Pertuit is a 47 y.o.  male present for follow-up on hypertension/hyperlipidemia/acute combined systolic and diastolic heart failure/cardiomyopathy Reports compliance with losartan 100 mg daily, Lasix 40 mg daily and Lipitor. Patient denies chest pain, shortness of breath, dizziness or lower extremity edema.   Coreg discontinued and metoprolol XL discontinued (by pt) secondary to side effects. He has established with cardiology and CTS. patient does not have a thoracic aortic aneurysm.  Repeat imaging ruled out aneurysm- artifact caused misdiagnosis.  Diet: Watching diet very closely, low-sodium Exercise: Routine exercise RF: Hypertension, hyperlipidemia, obesity, dilated cardiomyopathy, thoracic aortic aneurysm.  CTA 09/17/2017: CLINICAL DATA:  Follow-up of aneurysmal disease of the ascending thoracic aorta. EXAM: CT ANGIOGRAPHY CHEST WITH CONTRAST CONTRAST:  ISOVUE-370 IOPAMIDOL (ISOVUE-370) INJECTION 76% COMPARISON:  03/22/2017 IMPRESSION: Previous aortic dimensions are felt to be overestimated due to motion artifact. The ascending thoracic aorta actually measures 4 cm in greatest diameter, which is at the upper limits of normal.   CT anterior chest 03/12/2017: IMPRESSION: 1. Ascending thoracic aorta measures 4.7 x 4.4 cm.   2.  No demonstrable pulmonary embolus. 3. There is prominence of the main pulmonary outflow tract with a measured transverse diameter of 3.9 cm. This finding is  concerning for underlying pulmonary arterial hypertension. 4.  No lung edema or consolidation. 5.  No evident thoracic adenopathy.  Echocardiogram 04/26/2017: LV EF: 55-65 % Study Conclusions - Left ventricle: The cavity size was normal. There was moderate   concentric hypertrophy. Systolic function was normal. The   estimated ejection fraction was in the range of 55% to 65%. Wall   motion was normal; there were no regional wall motion   abnormalities. The study is not technically sufficient to allow   evaluation of LV diastolic function. - Aortic valve: Valve area (VTI): 3.44 cm^2. Valve area (Vmax):   3.05 cm^2. Valve area (Vmean): 2.95 cm^2. Impressions: - Normal LVEF.   Moderate LVH.   Trace MR.     07/18/2022    9:31 AM 02/02/2021   10:27 AM 09/27/2020    9:40 AM 12/23/2019   10:52 AM 06/24/2018    9:15 AM  Depression screen PHQ 2/9  Decreased Interest 0 0 0 0 0  Down, Depressed, Hopeless 0 0 0 0 0  PHQ - 2 Score 0 0 0 0 0  Altered sleeping     0  Tired, decreased energy     0  Change in appetite     1  Feeling bad or failure about yourself      0  Trouble concentrating     0  Moving slowly or fidgety/restless     0  Suicidal thoughts     0  PHQ-9 Score     1  Difficult doing work/chores     Not difficult at all    Past Medical History:  Diagnosis Date   Asthma    Environmental allergies    GERD (  gastroesophageal reflux disease)    Hemorrhoids    Obesity    Allergies  Allergen Reactions   Amoxicillin Rash   Penicillins Rash   Past Surgical History:  Procedure Laterality Date   WISDOM TOOTH EXTRACTION     Family History  Problem Relation Age of Onset   Colon cancer Maternal Grandmother 25       early death   Diabetes Paternal Grandmother    Kidney disease Paternal Grandmother    Diabetes Paternal Grandfather    Kidney disease Paternal Grandfather    Colon cancer Maternal Aunt 35   Colon cancer Cousin    Stomach cancer Neg Hx    Pancreatic cancer  Neg Hx    Esophageal cancer Neg Hx    Social History   Socioeconomic History   Marital status: Married    Spouse name: Jaymes Graff   Number of children: 2   Years of education: 16   Highest education level: Bachelor's degree (e.g., BA, AB, BS)  Occupational History   Occupation: Sports administrator  Tobacco Use   Smoking status: Never   Smokeless tobacco: Never  Vaping Use   Vaping Use: Never used  Substance and Sexual Activity   Alcohol use: Yes    Comment: occasional   Drug use: No   Sexual activity: Yes    Partners: Female    Birth control/protection: None  Other Topics Concern   Not on file  Social History Narrative   Married to Amado, 2 children.    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 Drinks caffeine.    Smoke alarm in the home. Wears seatbelt.    Feels safe in his relationships.    Social Determinants of Health   Financial Resource Strain: Low Risk  (07/17/2022)   Overall Financial Resource Strain (CARDIA)    Difficulty of Paying Living Expenses: Not very hard  Food Insecurity: No Food Insecurity (07/17/2022)   Hunger Vital Sign    Worried About Running Out of Food in the Last Year: Never true    Ran Out of Food in the Last Year: Never true  Transportation Needs: No Transportation Needs (07/17/2022)   PRAPARE - Administrator, Civil Service (Medical): No    Lack of Transportation (Non-Medical): No  Physical Activity: Sufficiently Active (07/17/2022)   Exercise Vital Sign    Days of Exercise per Week: 7 days    Minutes of Exercise per Session: 60 min  Stress: No Stress Concern Present (07/17/2022)   [employer] of Occupational Health - Occupational Stress Questionnaire    Feeling of Stress : Not at all  Social Connections: Socially Integrated (07/17/2022)   Social Connection and Isolation Panel [NHANES]    Frequency of Communication with Friends and Family: More than three times a week    Frequency of Social  Gatherings with Friends and Family: Once a week    Attends Religious Services: More than 4 times per year    Active Member of [employer] or Organizations: Yes    Attends Banker Meetings: More than 4 times per year    Marital Status: Married  Catering manager Violence: Not on file   Allergies as of 07/18/2022       Reactions   Amoxicillin Rash   Penicillins Rash        Medication List        Accurate as of July 18, 2022 11:32 AM. If you have any questions, ask your nurse or  doctor.          STOP taking these medications    cyclobenzaprine 5 MG tablet Commonly known as: FLEXERIL Stopped by: Felix Pacinienee Ariyan Sinnett, DO   meloxicam 15 MG tablet Commonly known as: MOBIC Stopped by: Felix Pacinienee Katrinia Straker, DO       TAKE these medications    atorvastatin 40 MG tablet Commonly known as: LIPITOR Take 1 tablet (40 mg total) by mouth daily.   furosemide 20 MG tablet Commonly known as: LASIX Take 2 tablets (40 mg total) by mouth daily.   losartan 100 MG tablet Commonly known as: COZAAR Take 1 tablet (100 mg total) by mouth daily.        Patient was never admitted.   ROS: 14 pt review of systems performed and negative (unless mentioned in an HPI)  Objective: BP (!) 143/88   Pulse 72   Temp 97.6 F (36.4 C)   Wt 294 lb 3.2 oz (133.4 kg)   SpO2 97%   BMI 42.21 kg/m   Physical Exam Vitals and nursing note reviewed.  Constitutional:      General: He is not in acute distress.    Appearance: Normal appearance. He is obese. He is not ill-appearing, toxic-appearing or diaphoretic.  HENT:     Head: Normocephalic and atraumatic.  Eyes:     General: No scleral icterus.       Right eye: No discharge.        Left eye: No discharge.     Extraocular Movements: Extraocular movements intact.     Pupils: Pupils are equal, round, and reactive to light.  Cardiovascular:     Rate and Rhythm: Normal rate and regular rhythm.     Heart sounds: No murmur heard. Pulmonary:      Effort: Pulmonary effort is normal. No respiratory distress.     Breath sounds: Normal breath sounds. No wheezing, rhonchi or rales.  Musculoskeletal:     Right lower leg: No edema.     Left lower leg: No edema.  Skin:    General: Skin is warm and dry.     Coloration: Skin is not jaundiced or pale.     Findings: No rash.  Neurological:     Mental Status: He is alert and oriented to person, place, and time. Mental status is at baseline.  Psychiatric:        Mood and Affect: Mood normal.        Behavior: Behavior normal.        Thought Content: Thought content normal.        Judgment: Judgment normal.    No results found for this or any previous visit (from the past 24 hour(s)).   Assessment/plan: Darren Carlson is a 47 y.o. male present for establishment visit with  Essential hypertension/dyspnea on exertion/morbid obesity diastolic heart failure (HCC)/hyperlipidemia goal less than 70 LDL Stable-reviewed blood pressure last week at another office and it was normal.  He has another appointment with a different doctor next week and if above goal he will monitor blood pressure at home and call with results if they are consistently abnormal. - Ejection fraction improved, return to normal. LVH, trace MR. - Thoracic aortic aneurysm --> repeat imaging felt he did not have an  aneurysm and original was artifact.  - prior med: amlodipine, aldactone, coreg (SE), metoprolol (SE). Continue losartan 100 mg daily (extra benefit w/ gout) Continue Lasix 40 - DIScontinue Aldactone 50 mg daily.   Continue daily baby aspirin. Continue atorvastatin 40  mg daily (he has refused 80 mg ) - Continue follow-up per routine schedule with cardiology  CBC, CMP and lipids collected today   Return in about 24 weeks (around 01/02/2023) for cpe (40 min), Routine chronic condition follow-up (40 min).   Orders Placed This Encounter  Procedures   CBC   Lipid panel   Comp Met (CMET)   Meds ordered this encounter   Medications   losartan (COZAAR) 100 MG tablet    Sig: Take 1 tablet (100 mg total) by mouth daily.    Dispense:  90 tablet    Refill:  1   furosemide (LASIX) 20 MG tablet    Sig: Take 2 tablets (40 mg total) by mouth daily.    Dispense:  180 tablet    Refill:  1   atorvastatin (LIPITOR) 40 MG tablet    Sig: Take 1 tablet (40 mg total) by mouth daily.    Dispense:  90 tablet    Refill:  3      Note is dictated utilizing voice recognition software. Although note has been proof read prior to signing, occasional typographical errors still can be missed. If any questions arise, please do not hesitate to call for verification.   Electronically signed by: Felix Pacini, DO [facility]

## 2022-07-24 ENCOUNTER — Ambulatory Visit
Admission: RE | Admit: 2022-07-24 | Discharge: 2022-07-24 | Disposition: A | Discharge status: Home / Self Care | Payer: BC Managed Care – PPO | Source: Ambulatory Visit | Attending: Sports Medicine | Admitting: Sports Medicine

## 2022-07-24 DIAGNOSIS — M9901 Segmental and somatic dysfunction of cervical region: Secondary | ICD-10-CM

## 2022-07-24 DIAGNOSIS — M5412 Radiculopathy, cervical region: Secondary | ICD-10-CM

## 2022-07-24 DIAGNOSIS — M5134 Other intervertebral disc degeneration, thoracic region: Secondary | ICD-10-CM

## 2022-07-24 DIAGNOSIS — M47812 Spondylosis without myelopathy or radiculopathy, cervical region: Secondary | ICD-10-CM | POA: Diagnosis not present

## 2022-07-24 DIAGNOSIS — R29898 Other symptoms and signs involving the musculoskeletal system: Secondary | ICD-10-CM

## 2022-07-24 MED ORDER — IOPAMIDOL (ISOVUE-M 300) INJECTION 61%
1.0000 mL | Freq: Once | INTRAMUSCULAR | Status: AC
  Administered 2022-07-24: 1 mL via EPIDURAL

## 2022-07-24 MED ORDER — TRIAMCINOLONE ACETONIDE 40 MG/ML IJ SUSP (RADIOLOGY)
60.0000 mg | Freq: Once | INTRAMUSCULAR | Status: AC
  Administered 2022-07-24: 60 mg via EPIDURAL

## 2022-07-24 NOTE — Discharge Instructions (Signed)

## 2022-08-03 NOTE — Progress Notes (Deleted)
    [name] [address] [place] [address] Phone: [phone]   Assessment and Plan:     There are no diagnoses linked to this encounter.  ***   Pertinent previous records reviewed include ***   Follow Up: ***     Subjective:   I, Darren Carlson, am serving as a Neurosurgeon for Doctor Richardean Sale   Chief Complaint: left arm pain    HPI:    12/15/2021 Patient is a 47 year old male complaining of thoracic and shoulder pain. Patient states left sided chest and shoulder pain of 2 weeks duration.  He has had similar discomfort in the past and had been evaluated by cardio, which felt discomfort was MSK at that time.  Patient has been on a weight loss journey and has been dieting, as well as exercising more frequently.  He has lost close to 90 pounds over the last year.  The pain he is experiencing he points to his left pectoralis muscle group.  This pain is reproducible with movement.  He also is having some tingling that will go down the back of his arm to his elbow. Is able to replicate the pain down his left shoulder and back , is getting an MRI needs to make an appointment     01/11/2022 Patient states that he is doing the same      05/28/2022 Patient is a 47 year old male complaining of left arm pain. Patient states he was moving a couch and he felt something rested and that got better, Monday he went to a workout core/back he is flared he was doing a lot of shoulder movements and states he thinks he has a pinched nerve , has numbness in his fingers, pain down the whole arm , he has a TTP spot on his left shoulder that radiates down the arm , has pain when sleeping , using and swiping on his phone , has been taking meloxicam, advil did not happen, no decrease ROM ,   06/26/2022 Patient states he is okay    07/13/2022 Patient states that he is good same ole same, he can notice more or less tingling when he adjust    08/06/2022 Patient states     Relevant Historical Information: Hypertension  Additional pertinent review of systems negative.   Current Outpatient Medications:    atorvastatin (LIPITOR) 40 MG tablet, Take 1 tablet (40 mg total) by mouth daily., Disp: 90 tablet, Rfl: 3   furosemide (LASIX) 20 MG tablet, Take 2 tablets (40 mg total) by mouth daily., Disp: 180 tablet, Rfl: 1   losartan (COZAAR) 100 MG tablet, Take 1 tablet (100 mg total) by mouth daily., Disp: 90 tablet, Rfl: 1   Objective:     There were no vitals filed for this visit.    There is no height or weight on file to calculate BMI.    Physical Exam:    ***   Electronically signed by:  [name] 7:24 AM 08/03/22

## 2022-08-06 ENCOUNTER — Ambulatory Visit: Payer: BC Managed Care – PPO | Admitting: Sports Medicine

## 2022-08-06 NOTE — Progress Notes (Unsigned)
    Darren Carlson [facility] [address] [place] [address] Phone: [phone]   Assessment and Plan:     There are no diagnoses linked to this encounter.  ***   Pertinent previous records reviewed include ***   Follow Up: ***     Subjective:   I, Darren Carlson, am serving as a Neurosurgeon for Doctor Darren Carlson   Chief Complaint: left arm pain    HPI:    12/15/2021 Patient is a 47 year old male complaining of thoracic and shoulder pain. Patient states left sided chest and shoulder pain of 2 weeks duration.  He has had similar discomfort in the past and had been evaluated by cardio, which felt discomfort was MSK at that time.  Patient has been on a weight loss journey and has been dieting, as well as exercising more frequently.  He has lost close to 90 pounds over the last year.  The pain he is experiencing he points to his left pectoralis muscle group.  This pain is reproducible with movement.  He also is having some tingling that will go down the back of his arm to his elbow. Is able to replicate the pain down his left shoulder and back , is getting an MRI needs to make an appointment     01/11/2022 Patient states that he is doing the same      05/28/2022 Patient is a 47 year old male complaining of left arm pain. Patient states he was moving a couch and he felt something rested and that got better, Monday he went to a workout core/back he is flared he was doing a lot of shoulder movements and states he thinks he has a pinched nerve , has numbness in his fingers, pain down the whole arm , he has a TTP spot on his left shoulder that radiates down the arm , has pain when sleeping , using and swiping on his phone , has been taking meloxicam, advil did not happen, no decrease ROM ,   06/26/2022 Patient states he is okay    07/13/2022 Patient states that he is good same ole same, he can notice more or less tingling when he adjust    08/07/2022 Patient states    Relevant Historical Information: Hypertension  Additional pertinent review of systems negative.   Current Outpatient Medications:    atorvastatin (LIPITOR) 40 MG tablet, Take 1 tablet (40 mg total) by mouth daily., Disp: 90 tablet, Rfl: 3   furosemide (LASIX) 20 MG tablet, Take 2 tablets (40 mg total) by mouth daily., Disp: 180 tablet, Rfl: 1   losartan (COZAAR) 100 MG tablet, Take 1 tablet (100 mg total) by mouth daily., Disp: 90 tablet, Rfl: 1   Objective:     There were no vitals filed for this visit.    There is no height or weight on file to calculate BMI.    Physical Exam:    ***   Electronically signed by:  Darren Carlson [facility] 11:58 AM 08/06/22

## 2022-08-07 ENCOUNTER — Ambulatory Visit: Payer: BC Managed Care – PPO | Admitting: Sports Medicine

## 2022-08-07 VITALS — BP 122/84 | HR 62 | Ht 70.0 in | Wt 294.0 lb

## 2022-08-07 DIAGNOSIS — R29898 Other symptoms and signs involving the musculoskeletal system: Secondary | ICD-10-CM | POA: Diagnosis not present

## 2022-08-07 DIAGNOSIS — M5134 Other intervertebral disc degeneration, thoracic region: Secondary | ICD-10-CM

## 2022-08-07 DIAGNOSIS — M5412 Radiculopathy, cervical region: Secondary | ICD-10-CM

## 2022-08-07 NOTE — Patient Instructions (Signed)
Good to see you   

## 2022-10-26 ENCOUNTER — Encounter: Payer: Self-pay | Admitting: Family Medicine

## 2022-12-04 ENCOUNTER — Encounter: Payer: Self-pay | Admitting: Sports Medicine

## 2022-12-05 NOTE — Progress Notes (Unsigned)
    [name] [address] [place] [address] Phone: [phone]   Assessment and Plan:     There are no diagnoses linked to this encounter.  ***   Pertinent previous records reviewed include ***   Follow Up: ***     Subjective:   I, Charlaine Utsey, am serving as a Neurosurgeon for Doctor Richardean Sale  Chief Complaint: ankle/ achilles pain   HPI:   12/06/2022 Patient is a 47 year old male complaining of ankle and achilles pain. Patient states  Relevant Historical Information: ***  Additional pertinent review of systems negative.   Current Outpatient Medications:    atorvastatin (LIPITOR) 40 MG tablet, Take 1 tablet (40 mg total) by mouth daily., Disp: 90 tablet, Rfl: 3   furosemide (LASIX) 20 MG tablet, Take 2 tablets (40 mg total) by mouth daily., Disp: 180 tablet, Rfl: 1   losartan (COZAAR) 100 MG tablet, Take 1 tablet (100 mg total) by mouth daily., Disp: 90 tablet, Rfl: 1   Objective:     There were no vitals filed for this visit.    There is no height or weight on file to calculate BMI.    Physical Exam:    ***   Electronically signed by:  [name] 12:12 PM 12/05/22

## 2022-12-06 ENCOUNTER — Ambulatory Visit: Payer: BC Managed Care – PPO | Admitting: Sports Medicine

## 2022-12-06 ENCOUNTER — Other Ambulatory Visit: Payer: Self-pay

## 2022-12-06 VITALS — HR 79 | Ht 70.0 in | Wt 300.0 lb

## 2022-12-06 DIAGNOSIS — G8929 Other chronic pain: Secondary | ICD-10-CM

## 2022-12-06 DIAGNOSIS — M7731 Calcaneal spur, right foot: Secondary | ICD-10-CM

## 2022-12-06 DIAGNOSIS — M79671 Pain in right foot: Secondary | ICD-10-CM | POA: Diagnosis not present

## 2022-12-06 MED ORDER — MELOXICAM 15 MG PO TABS: 15.0000 mg | ORAL_TABLET | Freq: Every day | ORAL | 0 refills | Status: DC

## 2022-12-06 NOTE — Patient Instructions (Signed)
-   Start meloxicam 15 mg daily x2 weeks.  If still having pain after 2 weeks, complete 3rd-week of meloxicam. May use remaining meloxicam as needed once daily for pain control.  Do not to use additional NSAIDs while taking meloxicam.  May use Tylenol [phone] mg 2 to 3 times a day for breakthrough pain. Achilles HEP  As needed follow up if no improvement 3-4 week follow up

## 2022-12-21 ENCOUNTER — Ambulatory Visit: Payer: BC Managed Care – PPO | Admitting: Family Medicine

## 2022-12-21 ENCOUNTER — Encounter: Payer: Self-pay | Admitting: Family Medicine

## 2022-12-21 ENCOUNTER — Other Ambulatory Visit (HOSPITAL_BASED_OUTPATIENT_CLINIC_OR_DEPARTMENT_OTHER): Payer: Self-pay

## 2022-12-21 ENCOUNTER — Other Ambulatory Visit: Payer: Self-pay

## 2022-12-21 VITALS — BP 156/110 | HR 66 | Temp 97.6°F | Wt 323.8 lb

## 2022-12-21 DIAGNOSIS — Z6841 Body Mass Index (BMI) 40.0 and over, adult: Secondary | ICD-10-CM

## 2022-12-21 DIAGNOSIS — I5032 Chronic diastolic (congestive) heart failure: Secondary | ICD-10-CM | POA: Diagnosis not present

## 2022-12-21 DIAGNOSIS — E785 Hyperlipidemia, unspecified: Secondary | ICD-10-CM

## 2022-12-21 DIAGNOSIS — I10 Essential (primary) hypertension: Secondary | ICD-10-CM

## 2022-12-21 DIAGNOSIS — I420 Dilated cardiomyopathy: Secondary | ICD-10-CM | POA: Diagnosis not present

## 2022-12-21 MED ORDER — FUROSEMIDE 20 MG PO TABS: 40.0000 mg | ORAL_TABLET | Freq: Every day | ORAL | 1 refills | Status: DC

## 2022-12-21 MED ORDER — WEGOVY 0.5 MG/0.5ML ~~LOC~~ SOAJ
0.5000 mg | SUBCUTANEOUS | 0 refills | Status: DC
  Filled 2022-12-21 – 2023-01-30 (×2): qty 2, 28d supply, fill #0

## 2022-12-21 MED ORDER — LOSARTAN POTASSIUM 100 MG PO TABS: 100.0000 mg | ORAL_TABLET | Freq: Every day | ORAL | 1 refills | Status: DC

## 2022-12-21 MED ORDER — WEGOVY 0.25 MG/0.5ML ~~LOC~~ SOAJ
0.2500 mg | SUBCUTANEOUS | 0 refills | Status: DC
  Filled 2022-12-21: qty 2, 28d supply, fill #0

## 2022-12-21 NOTE — Patient Instructions (Signed)

## 2022-12-21 NOTE — Progress Notes (Signed)
Patient ID: Darren Carlson, male  DOB: 24-Dec-1975, 47 y.o.   MRN: [id number] Patient Care Team    Relationship Specialty Notifications Start End  Natalia Leatherwood, DO PCP - General Family Medicine  01/23/17   Alleen Borne, MD Consulting Physician Cardiothoracic Surgery  05/03/17   Georgeanna Lea, MD Consulting Physician Cardiology  05/03/17   Richardean Sale, DO Consulting Physician Sports Medicine  12/21/22     Chief Complaint  Patient presents with   Hypertension    Subjective:Darren Carlson is a 47 y.o. male present for [facility] Ceth Mullady is a 47 y.o.  male present for follow-up on hypertension/hyperlipidemia/acute combined systolic and diastolic heart failure/cardiomyopathy Reports compliance with losartan 100 mg daily, Lasix 40 mg daily and Lipitor.  Patient denies chest pain, shortness of breath, dizziness or lower extremity edema.  He forgot to take his medication this morning.  He has regained weight back and is struggling with weight loss despite exercising and calorie restricting.  Coreg discontinued and metoprolol XL discontinued (by pt) secondary to side effects. He has established with cardiology and CTS. patient does not have a thoracic aortic aneurysm.  Repeat imaging ruled out aneurysm- artifact caused misdiagnosis.  Diet: Watching diet very closely, low-sodium Exercise: Routine exercise RF: Hypertension, hyperlipidemia, obesity, dilated cardiomyopathy, thoracic aortic aneurysm.  CTA 09/17/2017: CLINICAL DATA:  Follow-up of aneurysmal disease of the ascending thoracic aorta. EXAM: CT ANGIOGRAPHY CHEST WITH CONTRAST CONTRAST:  ISOVUE-370 IOPAMIDOL (ISOVUE-370) INJECTION 76% COMPARISON:  03/22/2017 IMPRESSION: Previous aortic dimensions are felt to be overestimated due to motion artifact. The ascending thoracic aorta actually measures 4 cm in greatest diameter, which is at the upper limits of normal.   CT anterior chest 03/12/2017: IMPRESSION: 1.  Ascending thoracic aorta measures 4.7 x 4.4 cm.   2.  No demonstrable pulmonary embolus. 3. There is prominence of the main pulmonary outflow tract with a measured transverse diameter of 3.9 cm. This finding is concerning for underlying pulmonary arterial hypertension. 4.  No lung edema or consolidation. 5.  No evident thoracic adenopathy.  Echocardiogram 04/26/2017: LV EF: 55-65 % Study Conclusions - Left ventricle: The cavity size was normal. There was moderate   concentric hypertrophy. Systolic function was normal. The   estimated ejection fraction was in the range of 55% to 65%. Wall   motion was normal; there were no regional wall motion   abnormalities. The study is not technically sufficient to allow   evaluation of LV diastolic function. - Aortic valve: Valve area (VTI): 3.44 cm^2. Valve area (Vmax):   3.05 cm^2. Valve area (Vmean): 2.95 cm^2. Impressions: - Normal LVEF.   Moderate LVH.   Trace MR.     07/18/2022    9:31 AM 02/02/2021   10:27 AM 09/27/2020    9:40 AM 12/23/2019   10:52 AM 06/24/2018    9:15 AM  Depression screen PHQ 2/9  Decreased Interest 0 0 0 0 0  Down, Depressed, Hopeless 0 0 0 0 0  PHQ - 2 Score 0 0 0 0 0  Altered sleeping     0  Tired, decreased energy     0  Change in appetite     1  Feeling bad or failure about yourself      0  Trouble concentrating     0  Moving slowly or fidgety/restless     0  Suicidal thoughts     0  PHQ-9 Score     1  Difficult doing  work/chores     Not difficult at all    Past Medical History:  Diagnosis Date   Asthma    Environmental allergies    GERD (gastroesophageal reflux disease)    Hemorrhoids    Obesity    Allergies  Allergen Reactions   Amoxicillin Rash   Penicillins Rash   Past Surgical History:  Procedure Laterality Date   WISDOM TOOTH EXTRACTION     Family History  Problem Relation Age of Onset   Colon cancer Maternal Grandmother 12       early death   Diabetes Paternal Grandmother     Kidney disease Paternal Grandmother    Diabetes Paternal Grandfather    Kidney disease Paternal Grandfather    Colon cancer Maternal Aunt 35   Colon cancer Cousin    Stomach cancer Neg Hx    Pancreatic cancer Neg Hx    Esophageal cancer Neg Hx    Social History   Socioeconomic History   Marital status: Married    Spouse name: Jaymes Graff   Number of children: 2   Years of education: 16   Highest education level: Bachelor's degree (e.g., BA, AB, BS)  Occupational History   Occupation: Sports administrator  Tobacco Use   Smoking status: Never   Smokeless tobacco: Never  Vaping Use   Vaping status: Never Used  Substance and Sexual Activity   Alcohol use: Yes    Comment: occasional   Drug use: No   Sexual activity: Yes    Partners: Female    Birth control/protection: None  Other Topics Concern   Not on file  Social History Narrative   Married to [place], 2 children.    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 Drinks caffeine.    Smoke alarm in the home. Wears seatbelt.    Feels safe in his relationships.    Social Determinants of Health   Financial Resource Strain: Low Risk  (07/17/2022)   Overall Financial Resource Strain (CARDIA)    Difficulty of Paying Living Expenses: Not very hard  Food Insecurity: No Food Insecurity (07/17/2022)   Hunger Vital Sign    Worried About Running Out of Food in the Last Year: Never true    Ran Out of Food in the Last Year: Never true  Transportation Needs: No Transportation Needs (07/17/2022)   PRAPARE - Administrator, Civil Service (Medical): No    Lack of Transportation (Non-Medical): No  Physical Activity: Sufficiently Active (07/17/2022)   Exercise Vital Sign    Days of Exercise per Week: 7 days    Minutes of Exercise per Session: 60 min  Stress: No Stress Concern Present (07/17/2022)   [employer] of Occupational Health - Occupational Stress Questionnaire    Feeling of Stress : Not at all   Social Connections: Socially Integrated (07/17/2022)   Social Connection and Isolation Panel [NHANES]    Frequency of Communication with Friends and Family: More than three times a week    Frequency of Social Gatherings with Friends and Family: Once a week    Attends Religious Services: More than 4 times per year    Active Member of [employer] or Organizations: Yes    Attends Banker Meetings: More than 4 times per year    Marital Status: Married  Intimate Partner Violence: Unknown (07/11/2021)   Received from [employer], [facility]   HITS    Physically Hurt: Not on file    Insult or Talk  Down To: Not on file    Threaten Physical Harm: Not on file    Scream or Curse: Not on file   Allergies as of 12/21/2022       Reactions   Amoxicillin Rash   Penicillins Rash        Medication List        Accurate as of December 21, 2022 11:04 AM. If you have any questions, ask your nurse or doctor.          STOP taking these medications    meloxicam 15 MG tablet Commonly known as: MOBIC Stopped by: Felix Pacini       TAKE these medications    atorvastatin 40 MG tablet Commonly known as: LIPITOR Take 1 tablet (40 mg total) by mouth daily.   furosemide 20 MG tablet Commonly known as: LASIX Take 2 tablets (40 mg total) by mouth daily.   losartan 100 MG tablet Commonly known as: COZAAR Take 1 tablet (100 mg total) by mouth daily.   Wegovy 0.25 MG/0.5ML Soaj Generic drug: Semaglutide-Weight Management Inject 0.25 mg into the skin once a week. Started by: Felix Pacini   Wegovy 0.5 MG/0.5ML Soaj Generic drug: Semaglutide-Weight Management Inject 0.5 mg into the skin once a week. Start taking on: January 11, 2023 Started by: Felix Pacini        Patient was never admitted.   ROS: 14 pt review of systems performed and negative (unless mentioned in an HPI)  Objective: BP (!) 156/110   Pulse 66   Temp 97.6 F (36.4 C)   Wt (!) 323 lb 12.8 oz (146.9  kg)   SpO2 100%   BMI 46.46 kg/m   Physical Exam Vitals and nursing note reviewed.  Constitutional:      General: He is not in acute distress.    Appearance: Normal appearance. He is obese. He is not ill-appearing, toxic-appearing or diaphoretic.  HENT:     Head: Normocephalic and atraumatic.  Eyes:     General: No scleral icterus.       Right eye: No discharge.        Left eye: No discharge.     Extraocular Movements: Extraocular movements intact.     Pupils: Pupils are equal, round, and reactive to light.  Cardiovascular:     Rate and Rhythm: Normal rate and regular rhythm.     Heart sounds: No murmur heard. Pulmonary:     Effort: Pulmonary effort is normal. No respiratory distress.     Breath sounds: Normal breath sounds. No wheezing, rhonchi or rales.  Musculoskeletal:     Right lower leg: No edema.     Left lower leg: No edema.  Skin:    General: Skin is warm and dry.     Coloration: Skin is not jaundiced or pale.     Findings: No rash.  Neurological:     Mental Status: He is alert and oriented to person, place, and time. Mental status is at baseline.  Psychiatric:        Mood and Affect: Mood normal.        Behavior: Behavior normal.        Thought Content: Thought content normal.        Judgment: Judgment normal.    No results found for this or any previous visit (from the past 24 hour(s)).   Assessment/plan: Kazeem Zambito is a 47 y.o. male present for  Essential hypertension/dyspnea on exertion/morbid obesity diastolic heart failure (HCC)/hyperlipidemia goal less than  70 LDL Stable at home. 125-135/80. He will check his BP at home for the next 3 days and report them back to use to ensure his med management is adequate - Ejection fraction improved, return to normal. LVH, trace MR. - Thoracic aortic aneurysm --> repeat imaging felt he did not have an  aneurysm and original was artifact.  - prior med: amlodipine, aldactone, coreg (SE), metoprolol (SE). Continue   losartan 100 mg daily (extra benefit w/ gout) Continue  Lasix 40 - DIScontinue Aldactone 50 mg daily.   Continue daily baby aspirin. Continue atorvastatin 40 mg daily (he has refused 80 mg ) - Continue follow-up per routine schedule with cardiology  Labs: Due 07/2023 [facility] taper start.  F/u  6 weeks  Return in about 6 weeks (around 02/01/2023) for Routine chronic condition follow-up.   No orders of the defined types were placed in this encounter.  Meds ordered this encounter  Medications   losartan (COZAAR) 100 MG tablet    Sig: Take 1 tablet (100 mg total) by mouth daily.    Dispense:  90 tablet    Refill:  1   furosemide (LASIX) 20 MG tablet    Sig: Take 2 tablets (40 mg total) by mouth daily.    Dispense:  180 tablet    Refill:  1   WEGOVY 0.25 MG/0.5ML SOAJ    Sig: Inject 0.25 mg into the skin once a week.    Dispense:  2 mL    Refill:  0   WEGOVY 0.5 MG/0.5ML SOAJ    Sig: Inject 0.5 mg into the skin once a week.    Dispense:  2 mL    Refill:  0      Note is dictated utilizing voice recognition software. Although note has been proof read prior to signing, occasional typographical errors still can be missed. If any questions arise, please do not hesitate to call for verification.   Electronically signed by: Felix Pacini, DO [facility] [place]

## 2022-12-24 ENCOUNTER — Other Ambulatory Visit (HOSPITAL_COMMUNITY): Payer: Self-pay

## 2022-12-24 ENCOUNTER — Telehealth: Payer: Self-pay

## 2022-12-24 NOTE — Telephone Encounter (Signed)
Pharmacy Patient Advocate Encounter   Received notification from PHYSICIANS OFFICE that prior authorization for [facility]  is required/requested.   Insurance verification completed.   The patient is insured through [facility] .   Per test claim: PA required; PA submitted to [facility] via CoverMyMeds Key/confirmation #/EOC Key: [id number]    Status is pending

## 2022-12-31 NOTE — Telephone Encounter (Signed)
Pharmacy Patient Advocate Encounter  Received notification from [facility] that Prior Authorization for [facility] has been DENIED.  Full denial letter will be uploaded to the media tab. See denial reason below.     PA #/Case ID/Reference #: Key: [id number]

## 2023-01-01 NOTE — Telephone Encounter (Signed)
He would pay out of pocket. Please make sure pharmacy fills and pt is aware

## 2023-01-01 NOTE — Telephone Encounter (Signed)
Spoke with patient regarding results/recommendations.  

## 2023-01-04 ENCOUNTER — Other Ambulatory Visit (HOSPITAL_BASED_OUTPATIENT_CLINIC_OR_DEPARTMENT_OTHER): Payer: Self-pay

## 2023-01-05 ENCOUNTER — Other Ambulatory Visit (HOSPITAL_BASED_OUTPATIENT_CLINIC_OR_DEPARTMENT_OTHER): Payer: Self-pay

## 2023-01-07 ENCOUNTER — Other Ambulatory Visit (HOSPITAL_COMMUNITY): Payer: Self-pay

## 2023-01-31 ENCOUNTER — Other Ambulatory Visit: Payer: Self-pay

## 2023-01-31 ENCOUNTER — Other Ambulatory Visit (HOSPITAL_BASED_OUTPATIENT_CLINIC_OR_DEPARTMENT_OTHER): Payer: Self-pay

## 2023-02-01 ENCOUNTER — Ambulatory Visit: Payer: BC Managed Care – PPO | Admitting: Family Medicine

## 2023-02-01 ENCOUNTER — Encounter: Payer: Self-pay | Admitting: Family Medicine

## 2023-02-01 ENCOUNTER — Other Ambulatory Visit (HOSPITAL_BASED_OUTPATIENT_CLINIC_OR_DEPARTMENT_OTHER): Payer: Self-pay

## 2023-02-01 VITALS — BP 138/88 | HR 73 | Temp 98.0°F | Ht 70.0 in | Wt 328.2 lb

## 2023-02-01 DIAGNOSIS — Z6841 Body Mass Index (BMI) 40.0 and over, adult: Secondary | ICD-10-CM | POA: Diagnosis not present

## 2023-02-01 DIAGNOSIS — E785 Hyperlipidemia, unspecified: Secondary | ICD-10-CM

## 2023-02-01 MED ORDER — WEGOVY 1 MG/0.5ML ~~LOC~~ SOAJ
1.0000 mg | SUBCUTANEOUS | 0 refills | Status: DC
  Filled 2023-02-01: qty 2, 28d supply, fill #0

## 2023-02-01 NOTE — Patient Instructions (Addendum)
Return in about 7 weeks (around 03/22/2023) for Weight loss counseling.        Great to see you today.  I have refilled the medication(s) we provide.   If labs were collected or images ordered, we will inform you of  results once we have received them and reviewed. We will contact you either by echart message, or telephone call.  Please give ample time to the testing facility, and our office to run,  receive and review results. Please do not call inquiring of results, even if you can see them in your chart. We will contact you as soon as we are able. If it has been over 1 week since the test was completed, and you have not yet heard from [place], then please call us.    - echart message- for normal results that have been seen by the patient already.   - telephone call: abnormal results or if patient has not viewed results in their echart.  If a referral to a specialist was entered for you, please call us in 2 weeks if you have not heard from the specialist office to schedule.

## 2023-02-01 NOTE — Progress Notes (Signed)
Patient ID: Darren Carlson, male  DOB: 1975/10/14, 47 y.o.   MRN: [id number] Patient Care Team    Relationship Specialty Notifications Start End  Natalia Leatherwood, DO PCP - General Family Medicine  01/23/17   Alleen Borne, MD Consulting Physician Cardiothoracic Surgery  05/03/17   Georgeanna Lea, MD Consulting Physician Cardiology  05/03/17   Richardean Sale, DO Consulting Physician Sports Medicine  12/21/22     Chief Complaint  Patient presents with   Obesity    Subjective:Darren Carlson is a 47 y.o. male present for [facility] Darren Carlson is a 47 y.o.  male present for follow-up on Body mass index is 47.09 kg/m. 328 pounds. Tolerating Wegovy taper, starts Wegovy 0.5 mg next week. Changing diet, exercising more    07/18/2022    9:31 AM 02/02/2021   10:27 AM 09/27/2020    9:40 AM 12/23/2019   10:52 AM 06/24/2018    9:15 AM  Depression screen PHQ 2/9  Decreased Interest 0 0 0 0 0  Down, Depressed, Hopeless 0 0 0 0 0  PHQ - 2 Score 0 0 0 0 0  Altered sleeping     0  Tired, decreased energy     0  Change in appetite     1  Feeling bad or failure about yourself      0  Trouble concentrating     0  Moving slowly or fidgety/restless     0  Suicidal thoughts     0  PHQ-9 Score     1  Difficult doing work/chores     Not difficult at all    Past Medical History:  Diagnosis Date   Asthma    Environmental allergies    GERD (gastroesophageal reflux disease)    Hemorrhoids    Obesity    Allergies  Allergen Reactions   Amoxicillin Rash   Penicillins Rash   Past Surgical History:  Procedure Laterality Date   WISDOM TOOTH EXTRACTION     Family History  Problem Relation Age of Onset   Colon cancer Maternal Grandmother 64       early death   Diabetes Paternal Grandmother    Kidney disease Paternal Grandmother    Diabetes Paternal Grandfather    Kidney disease Paternal Grandfather    Colon cancer Maternal Aunt 35   Colon cancer Cousin    Stomach cancer Neg Hx     Pancreatic cancer Neg Hx    Esophageal cancer Neg Hx    Social History   Socioeconomic History   Marital status: Married    Spouse name: Jaymes Graff   Number of children: 2   Years of education: 16   Highest education level: Bachelor's degree (e.g., BA, AB, BS)  Occupational History   Occupation: Sports administrator  Tobacco Use   Smoking status: Never   Smokeless tobacco: Never  Vaping Use   Vaping status: Never Used  Substance and Sexual Activity   Alcohol use: Yes    Comment: occasional   Drug use: No   Sexual activity: Yes    Partners: Female    Birth control/protection: None  Other Topics Concern   Not on file  Social History Narrative   Married to [place], 2 children.    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 Drinks caffeine.    Smoke alarm in the home. Wears seatbelt.    Feels safe in his relationships.    Social Determinants of Health  Financial Resource Strain: Low Risk  (07/17/2022)   Overall Financial Resource Strain (CARDIA)    Difficulty of Paying Living Expenses: Not very hard  Food Insecurity: No Food Insecurity (07/17/2022)   Hunger Vital Sign    Worried About Running Out of Food in the Last Year: Never true    Ran Out of Food in the Last Year: Never true  Transportation Needs: No Transportation Needs (07/17/2022)   PRAPARE - Administrator, Civil Service (Medical): No    Lack of Transportation (Non-Medical): No  Physical Activity: Sufficiently Active (07/17/2022)   Exercise Vital Sign    Days of Exercise per Week: 7 days    Minutes of Exercise per Session: 60 min  Stress: No Stress Concern Present (07/17/2022)   [employer] of Occupational Health - Occupational Stress Questionnaire    Feeling of Stress : Not at all  Social Connections: Socially Integrated (07/17/2022)   Social Connection and Isolation Panel [NHANES]    Frequency of Communication with Friends and Family: More than three times a week     Frequency of Social Gatherings with Friends and Family: Once a week    Attends Religious Services: More than 4 times per year    Active Member of [employer] or Organizations: Yes    Attends Banker Meetings: More than 4 times per year    Marital Status: Married  Catering manager Violence: Unknown (07/11/2021)   Received from [facility], [facility]   HITS    Physically Hurt: Not on file    Insult or Talk Down To: Not on file    Threaten Physical Harm: Not on file    Scream or Curse: Not on file   Allergies as of 02/01/2023       Reactions   Amoxicillin Rash   Penicillins Rash        Medication List        Accurate as of February 01, 2023  3:28 PM. If you have any questions, ask your nurse or doctor.          STOP taking these medications    Wegovy 0.25 MG/0.5ML Soaj Generic drug: Semaglutide-Weight Management Replaced by: Wegovy 1 MG/0.5ML Soaj You also have another medication with the same name that you need to continue taking as instructed. Stopped by: Felix Pacini       TAKE these medications    atorvastatin 40 MG tablet Commonly known as: LIPITOR Take 1 tablet (40 mg total) by mouth daily.   furosemide 20 MG tablet Commonly known as: LASIX Take 2 tablets (40 mg total) by mouth daily.   losartan 100 MG tablet Commonly known as: COZAAR Take 1 tablet (100 mg total) by mouth daily.   Wegovy 0.5 MG/0.5ML Soaj Generic drug: Semaglutide-Weight Management Inject 0.5 mg into the skin once a week. What changed:  Another medication with the same name was added. Make sure you understand how and when to take each. Another medication with the same name was removed. Continue taking this medication, and follow the directions you see here. Changed by: Nolen Mu 1 MG/0.5ML Soaj Generic drug: Semaglutide-Weight Management Inject 1 mg into the skin once a week. Start taking on: February 22, 2023 What changed: You were already taking a  medication with the same name, and this prescription was added. Make sure you understand how and when to take each. Replaces: Wegovy 0.25 MG/0.5ML Soaj Changed by: Felix Pacini        Patient  was never admitted.   ROS: 14 pt review of systems performed and negative (unless mentioned in an HPI)  Objective: BP 138/88   Pulse 73   Temp 98 F (36.7 C)   Ht 5\' 10"  (1.778 m)   Wt (!) 328 lb 3.2 oz (148.9 kg)   SpO2 98%   BMI 47.09 kg/m   Physical Exam Vitals and nursing note reviewed.  Constitutional:      General: He is not in acute distress.    Appearance: Normal appearance. He is not ill-appearing, toxic-appearing or diaphoretic.  HENT:     Head: Normocephalic and atraumatic.  Eyes:     General: No scleral icterus.       Right eye: No discharge.        Left eye: No discharge.     Extraocular Movements: Extraocular movements intact.     Pupils: Pupils are equal, round, and reactive to light.  Skin:    General: Skin is warm and dry.     Coloration: Skin is not jaundiced or pale.     Findings: No rash.  Neurological:     Mental Status: He is alert and oriented to person, place, and time. Mental status is at baseline.  Psychiatric:        Mood and Affect: Mood normal.        Behavior: Behavior normal.        Thought Content: Thought content normal.        Judgment: Judgment normal.    No results found for this or any previous visit (from the past 24 hour(s)).   Assessment/plan: Aeon Caulley is a 47 y.o. male present for  morbid obesity diastolic heart failure (HCC)/hyperlipidemia goal less than 70 LDL Stable at home. 125-135/80. He will check his BP at home for the next 3 days and report them back to use to ensure his med management is adequate - Ejection fraction improved, return to normal. LVH, trace MR. - Thoracic aortic aneurysm --> repeat imaging felt he did not have an  aneurysm and original was artifact.  - prior med: amlodipine, aldactone, coreg (SE),  metoprolol (SE). Continue  losartan 100 mg daily (extra benefit w/ gout) Continue  Lasix 40 - DIScontinue Aldactone 50 mg daily.   Continue daily baby aspirin. Continue atorvastatin 40 mg daily (he has refused 80 mg ) - Continue follow-up per routine schedule with cardiology  Labs: Due 07/2023 Continue Wegovy tapering.  Tolerating.  Wegovy 0.5 starting next week, for 4 weeks, then increase to [facility] 1 mg weekly. F/u  6 -7 weeks  Return in about 7 weeks (around 03/22/2023) for Weight loss counseling.   No orders of the defined types were placed in this encounter.  Meds ordered this encounter  Medications   Semaglutide-Weight Management (WEGOVY) 1 MG/0.5ML SOAJ    Sig: Inject 1 mg into the skin once a week.    Dispense:  2 mL    Refill:  0      Note is dictated utilizing voice recognition software. Although note has been proof read prior to signing, occasional typographical errors still can be missed. If any questions arise, please do not hesitate to call for verification.   Electronically signed by: Felix Pacini, DO [facility] [place]

## 2023-02-04 ENCOUNTER — Other Ambulatory Visit (HOSPITAL_BASED_OUTPATIENT_CLINIC_OR_DEPARTMENT_OTHER): Payer: Self-pay

## 2023-02-15 ENCOUNTER — Ambulatory Visit: Payer: BC Managed Care – PPO | Admitting: Family Medicine

## 2023-03-01 ENCOUNTER — Other Ambulatory Visit (HOSPITAL_BASED_OUTPATIENT_CLINIC_OR_DEPARTMENT_OTHER): Payer: Self-pay

## 2023-03-01 ENCOUNTER — Other Ambulatory Visit: Payer: Self-pay | Admitting: Family Medicine

## 2023-03-01 ENCOUNTER — Telehealth: Payer: Self-pay | Admitting: Family Medicine

## 2023-03-01 ENCOUNTER — Other Ambulatory Visit: Payer: Self-pay

## 2023-03-01 NOTE — Telephone Encounter (Signed)
Patient called to inform us that he received 1 mg of wegovy instead of .5 mg. He reports that he was fine at the 1 mg and did not experience any side effects. He would like to know should he just get another round of 1 mg or go up to 1.7? He is scheduled to do the injection on Tuesday. Pharmacy is confirmed as the [facility]

## 2023-03-04 ENCOUNTER — Other Ambulatory Visit (HOSPITAL_BASED_OUTPATIENT_CLINIC_OR_DEPARTMENT_OTHER): Payer: Self-pay

## 2023-03-05 ENCOUNTER — Other Ambulatory Visit: Payer: Self-pay | Admitting: Family Medicine

## 2023-03-05 ENCOUNTER — Other Ambulatory Visit (HOSPITAL_BASED_OUTPATIENT_CLINIC_OR_DEPARTMENT_OTHER): Payer: Self-pay

## 2023-03-05 MED ORDER — WEGOVY 1 MG/0.5ML ~~LOC~~ SOAJ
1.0000 mg | SUBCUTANEOUS | 0 refills | Status: DC
  Filled 2023-03-05: qty 2, 28d supply, fill #0

## 2023-03-05 MED ORDER — WEGOVY 1 MG/0.5ML ~~LOC~~ SOAJ
1.0000 mg | SUBCUTANEOUS | 0 refills | Status: DC
  Filled 2023-03-06: qty 2, 28d supply, fill #0

## 2023-03-05 NOTE — Telephone Encounter (Signed)
Spoke with pt

## 2023-03-05 NOTE — Telephone Encounter (Signed)
Recommend he continue the 1 mg weekly dosing.  Going up again quickly would likely cause side effects for him.  I have called in a refill on the 1 mg Wegovy.

## 2023-03-05 NOTE — Telephone Encounter (Signed)
1 mg weekly dose is fine. Prescription sent

## 2023-03-05 NOTE — Telephone Encounter (Signed)
Spoke with patient regarding results/recommendations.  

## 2023-03-06 ENCOUNTER — Other Ambulatory Visit: Payer: Self-pay | Admitting: Family Medicine

## 2023-03-06 ENCOUNTER — Other Ambulatory Visit (HOSPITAL_BASED_OUTPATIENT_CLINIC_OR_DEPARTMENT_OTHER): Payer: Self-pay

## 2023-03-22 ENCOUNTER — Other Ambulatory Visit (HOSPITAL_BASED_OUTPATIENT_CLINIC_OR_DEPARTMENT_OTHER): Payer: Self-pay

## 2023-03-22 ENCOUNTER — Ambulatory Visit: Payer: BC Managed Care – PPO | Admitting: Family Medicine

## 2023-03-22 ENCOUNTER — Other Ambulatory Visit: Payer: Self-pay

## 2023-03-22 ENCOUNTER — Encounter: Payer: Self-pay | Admitting: Family Medicine

## 2023-03-22 VITALS — BP 138/88 | HR 74 | Temp 98.4°F | Ht 70.0 in | Wt 316.6 lb

## 2023-03-22 DIAGNOSIS — Z6841 Body Mass Index (BMI) 40.0 and over, adult: Secondary | ICD-10-CM | POA: Diagnosis not present

## 2023-03-22 DIAGNOSIS — I10 Essential (primary) hypertension: Secondary | ICD-10-CM

## 2023-03-22 DIAGNOSIS — E785 Hyperlipidemia, unspecified: Secondary | ICD-10-CM

## 2023-03-22 MED ORDER — WEGOVY 1.7 MG/0.75ML ~~LOC~~ SOAJ
1.7000 mg | SUBCUTANEOUS | 0 refills | Status: DC
  Filled 2023-03-22: qty 3, 28d supply, fill #0

## 2023-03-22 MED ORDER — WEGOVY 2.4 MG/0.75ML ~~LOC~~ SOAJ
2.4000 mg | SUBCUTANEOUS | 5 refills | Status: DC
  Filled 2023-03-22: qty 3, fill #0
  Filled 2023-04-29: qty 3, 28d supply, fill #0

## 2023-03-22 NOTE — Progress Notes (Signed)
Patient ID: Darren Carlson, male  DOB: Aug 30, 1975, 47 y.o.   MRN: [id number] Patient Care Team    Relationship Specialty Notifications Start End  Natalia Leatherwood, DO PCP - General Family Medicine  01/23/17   Alleen Borne, MD Consulting Physician Cardiothoracic Surgery  05/03/17   Georgeanna Lea, MD Consulting Physician Cardiology  05/03/17   Richardean Sale, DO Consulting Physician Sports Medicine  12/21/22     Chief Complaint  Patient presents with   Obesity    Subjective:Darren Carlson is a 47 y.o. male present for [facility] Darren Carlson is a 47 y.o.  male present for follow-up on Body mass index is 45.43 kg/m. LBS: 328> 316 BMI: 47.09> 45.43 Tolerating Wegovy taper, on  Wegovy 1 mg Changing diet, exercising more- doing well     07/18/2022    9:31 AM 02/02/2021   10:27 AM 09/27/2020    9:40 AM 12/23/2019   10:52 AM 06/24/2018    9:15 AM  Depression screen PHQ 2/9  Decreased Interest 0 0 0 0 0  Down, Depressed, Hopeless 0 0 0 0 0  PHQ - 2 Score 0 0 0 0 0  Altered sleeping     0  Tired, decreased energy     0  Change in appetite     1  Feeling bad or failure about yourself      0  Trouble concentrating     0  Moving slowly or fidgety/restless     0  Suicidal thoughts     0  PHQ-9 Score     1  Difficult doing work/chores     Not difficult at all    Past Medical History:  Diagnosis Date   Asthma    Environmental allergies    GERD (gastroesophageal reflux disease)    Hemorrhoids    Obesity    Allergies  Allergen Reactions   Amoxicillin Rash   Penicillins Rash   Past Surgical History:  Procedure Laterality Date   WISDOM TOOTH EXTRACTION     Family History  Problem Relation Age of Onset   Colon cancer Maternal Grandmother 29       early death   Diabetes Paternal Grandmother    Kidney disease Paternal Grandmother    Diabetes Paternal Grandfather    Kidney disease Paternal Grandfather    Colon cancer Maternal Aunt 35   Colon cancer Cousin    Stomach cancer  Neg Hx    Pancreatic cancer Neg Hx    Esophageal cancer Neg Hx    Social History   Socioeconomic History   Marital status: Married    Spouse name: Jaymes Graff   Number of children: 2   Years of education: 16   Highest education level: Bachelor's degree (e.g., BA, AB, BS)  Occupational History   Occupation: Sports administrator  Tobacco Use   Smoking status: Never   Smokeless tobacco: Never  Vaping Use   Vaping status: Never Used  Substance and Sexual Activity   Alcohol use: Yes    Comment: occasional   Drug use: No   Sexual activity: Yes    Partners: Female    Birth control/protection: None  Other Topics Concern   Not on file  Social History Narrative   Married to [place], 2 children.    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 Drinks caffeine.    Smoke alarm in the home. Wears seatbelt.    Feels safe in his relationships.  Social Drivers of Corporate investment banker Strain: Low Risk  (07/17/2022)   Overall Financial Resource Strain (CARDIA)    Difficulty of Paying Living Expenses: Not very hard  Food Insecurity: No Food Insecurity (07/17/2022)   Hunger Vital Sign    Worried About Running Out of Food in the Last Year: Never true    Ran Out of Food in the Last Year: Never true  Transportation Needs: No Transportation Needs (07/17/2022)   PRAPARE - Administrator, Civil Service (Medical): No    Lack of Transportation (Non-Medical): No  Physical Activity: Sufficiently Active (07/17/2022)   Exercise Vital Sign    Days of Exercise per Week: 7 days    Minutes of Exercise per Session: 60 min  Stress: No Stress Concern Present (07/17/2022)   [employer] of Occupational Health - Occupational Stress Questionnaire    Feeling of Stress : Not at all  Social Connections: Socially Integrated (07/17/2022)   Social Connection and Isolation Panel [NHANES]    Frequency of Communication with Friends and Family: More than three times a week     Frequency of Social Gatherings with Friends and Family: Once a week    Attends Religious Services: More than 4 times per year    Active Member of [employer] or Organizations: Yes    Attends Banker Meetings: More than 4 times per year    Marital Status: Married  Catering manager Violence: Unknown (07/11/2021)   Received from [employer] [facility], [facility]   HITS    Physically Hurt: Not on file    Insult or Talk Down To: Not on file    Threaten Physical Harm: Not on file    Scream or Curse: Not on file   Allergies as of 03/22/2023       Reactions   Amoxicillin Rash   Penicillins Rash        Medication List        Accurate as of March 22, 2023 10:48 AM. If you have any questions, ask your nurse or doctor.          atorvastatin 40 MG tablet Commonly known as: LIPITOR Take 1 tablet (40 mg total) by mouth daily.   furosemide 20 MG tablet Commonly known as: LASIX Take 2 tablets (40 mg total) by mouth daily.   losartan 100 MG tablet Commonly known as: COZAAR Take 1 tablet (100 mg total) by mouth daily.   Wegovy 1 MG/0.5ML Soaj Generic drug: Semaglutide-Weight Management Inject 1 mg into the skin once a week. What changed: Another medication with the same name was added. Make sure you understand how and when to take each. Changed by: Felix Pacini   Wegovy 1.7 MG/0.75ML Soaj Generic drug: Semaglutide-Weight Management Inject 1.7 mg into the skin once a week. What changed: You were already taking a medication with the same name, and this prescription was added. Make sure you understand how and when to take each. Changed by: Felix Pacini   Wegovy 2.4 MG/0.75ML Soaj Generic drug: Semaglutide-Weight Management Inject 2.4 mg into the skin once a week. What changed: You were already taking a medication with the same name, and this prescription was added. Make sure you understand how and when to take each. Changed by: Felix Pacini        Patient was never  admitted.   ROS: 14 pt review of systems performed and negative (unless mentioned in an HPI)  Objective: BP 138/88   Pulse 74   Temp  98.4 F (36.9 C)   Ht 5\' 10"  (1.778 m)   Wt (!) 316 lb 9.6 oz (143.6 kg)   SpO2 97%   BMI 45.43 kg/m   Physical Exam Vitals and nursing note reviewed.  Constitutional:      General: He is not in acute distress.    Appearance: Normal appearance. He is not ill-appearing, toxic-appearing or diaphoretic.  HENT:     Head: Normocephalic and atraumatic.  Eyes:     General: No scleral icterus.       Right eye: No discharge.        Left eye: No discharge.     Extraocular Movements: Extraocular movements intact.     Pupils: Pupils are equal, round, and reactive to light.  Skin:    General: Skin is warm and dry.     Coloration: Skin is not jaundiced or pale.     Findings: No rash.  Neurological:     Mental Status: He is alert and oriented to person, place, and time. Mental status is at baseline.  Psychiatric:        Mood and Affect: Mood normal.        Behavior: Behavior normal.        Thought Content: Thought content normal.        Judgment: Judgment normal.    No results found for this or any previous visit (from the past 24 hours).   Assessment/plan: Darren Carlson is a 47 y.o. male present for  morbid obesity diastolic heart failure (HCC)/hyperlipidemia goal less than 70 LDL Stable at home. 125-135/80. He will check his BP at home for the next 3 days and report them back to use to ensure his med management is adequate - Ejection fraction improved, return to normal. LVH, trace MR. - Thoracic aortic aneurysm --> repeat imaging felt he did not have an  aneurysm and original was artifact.  - prior med: amlodipine, aldactone, coreg (SE), metoprolol (SE). Continue  losartan 100 mg daily (extra benefit w/ gout) Continue  Lasix 40 - DIScontinue Aldactone 50 mg daily.   Continue daily baby aspirin. Continue atorvastatin 40 mg daily (he has refused  80 mg ) - Continue follow-up per routine schedule with cardiology  Labs: Due 07/2023 Continue Wegovy tapering> 1.7 weekly, then 2.4 weekly.   Return in about 7 weeks (around 05/13/2023) for obesity.   No orders of the defined types were placed in this encounter.  Meds ordered this encounter  Medications   WEGOVY 1.7 MG/0.75ML SOAJ    Sig: Inject 1.7 mg into the skin once a week.    Dispense:  3 mL    Refill:  0    Fill this script before 2.4- thanks.   WEGOVY 2.4 MG/0.75ML SOAJ    Sig: Inject 2.4 mg into the skin once a week.    Dispense:  3 mL    Refill:  5    Note is dictated utilizing voice recognition software. Although note has been proof read prior to signing, occasional typographical errors still can be missed. If any questions arise, please do not hesitate to call for verification.   Electronically signed by: Felix Pacini, DO [facility] [place]

## 2023-03-22 NOTE — Patient Instructions (Addendum)
Return in about 7 weeks (around 05/13/2023) for obesity.        Great to see you today.  I have refilled the medication(s) we provide.   If labs were collected or images ordered, we will inform you of  results once we have received them and reviewed. We will contact you either by echart message, or telephone call.  Please give ample time to the testing facility, and our office to run,  receive and review results. Please do not call inquiring of results, even if you can see them in your chart. We will contact you as soon as we are able. If it has been over 1 week since the test was completed, and you have not yet heard from [place], then please call us.    - echart message- for normal results that have been seen by the patient already.   - telephone call: abnormal results or if patient has not viewed results in their echart.  If a referral to a specialist was entered for you, please call us in 2 weeks if you have not heard from the specialist office to schedule.

## 2023-04-20 ENCOUNTER — Encounter: Payer: Self-pay | Admitting: Family Medicine

## 2023-04-30 ENCOUNTER — Other Ambulatory Visit (HOSPITAL_BASED_OUTPATIENT_CLINIC_OR_DEPARTMENT_OTHER): Payer: Self-pay

## 2023-04-30 MED ORDER — SEMAGLUTIDE-WEIGHT MANAGEMENT 1.7 MG/0.75ML ~~LOC~~ SOAJ
1.7000 mg | SUBCUTANEOUS | 0 refills | Status: DC
  Filled 2023-04-30 (×2): qty 3, 28d supply, fill #0

## 2023-05-10 ENCOUNTER — Ambulatory Visit: Payer: BC Managed Care – PPO | Admitting: Family Medicine

## 2023-05-14 ENCOUNTER — Encounter (HOSPITAL_BASED_OUTPATIENT_CLINIC_OR_DEPARTMENT_OTHER): Payer: Self-pay

## 2023-05-14 ENCOUNTER — Other Ambulatory Visit (HOSPITAL_BASED_OUTPATIENT_CLINIC_OR_DEPARTMENT_OTHER): Payer: Self-pay

## 2023-05-14 ENCOUNTER — Encounter: Payer: Self-pay | Admitting: Family Medicine

## 2023-05-14 ENCOUNTER — Ambulatory Visit: Payer: BC Managed Care – PPO | Admitting: Family Medicine

## 2023-05-14 VITALS — BP 136/88 | HR 70 | Temp 97.6°F | Ht 70.0 in | Wt 331.4 lb

## 2023-05-14 DIAGNOSIS — I10 Essential (primary) hypertension: Secondary | ICD-10-CM

## 2023-05-14 DIAGNOSIS — Z6841 Body Mass Index (BMI) 40.0 and over, adult: Secondary | ICD-10-CM

## 2023-05-14 DIAGNOSIS — I420 Dilated cardiomyopathy: Secondary | ICD-10-CM | POA: Diagnosis not present

## 2023-05-14 DIAGNOSIS — E785 Hyperlipidemia, unspecified: Secondary | ICD-10-CM

## 2023-05-14 DIAGNOSIS — I5032 Chronic diastolic (congestive) heart failure: Secondary | ICD-10-CM

## 2023-05-14 LAB — COMPREHENSIVE METABOLIC PANEL
ALT: 18 U/L (ref 0–53)
AST: 22 U/L (ref 0–37)
Albumin: 4.4 g/dL (ref 3.5–5.2)
Alkaline Phosphatase: 40 U/L (ref 39–117)
BUN: 14 mg/dL (ref 6–23)
CO2: 29 meq/L (ref 19–32)
Calcium: 9.1 mg/dL (ref 8.4–10.5)
Chloride: 103 meq/L (ref 96–112)
Creatinine, Ser: 1.13 mg/dL (ref 0.40–1.50)
GFR: 77.97 mL/min (ref >60.00)
Glucose, Bld: 93 mg/dL (ref 70–99)
Potassium: 3.9 meq/L (ref 3.5–5.1)
Sodium: 140 meq/L (ref 135–145)
Total Bilirubin: 1.5 mg/dL — ABNORMAL HIGH (ref 0.2–1.2)
Total Protein: 6.5 g/dL (ref 6.0–8.3)

## 2023-05-14 LAB — LIPID PANEL
Cholesterol: 116 mg/dL (ref 0–200)
HDL: 52.3 mg/dL (ref >39.00)
LDL Cholesterol: 52 mg/dL (ref 0–99)
NonHDL: 63.51
Total CHOL/HDL Ratio: 2
Triglycerides: 57 mg/dL (ref 0.0–149.0)
VLDL: 11.4 mg/dL (ref 0.0–40.0)

## 2023-05-14 LAB — CBC
HCT: 46.1 % (ref 39.0–52.0)
Hemoglobin: 15.4 g/dL (ref 13.0–17.0)
MCHC: 33.4 g/dL (ref 30.0–36.0)
MCV: 86.6 fL (ref 78.0–100.0)
Platelets: 176 10*3/uL (ref 150.0–400.0)
RBC: 5.33 Mil/uL (ref 4.22–5.81)
RDW: 13.7 % (ref 11.5–15.5)
WBC: 6.5 10*3/uL (ref 4.0–10.5)

## 2023-05-14 LAB — TSH: TSH: 1.24 u[IU]/mL (ref 0.35–5.50)

## 2023-05-14 MED ORDER — ZEPBOUND 10 MG/0.5ML ~~LOC~~ SOAJ
10.0000 mg | SUBCUTANEOUS | 0 refills | Status: DC
  Filled 2023-05-14 – 2023-06-21 (×2): qty 2, 28d supply, fill #0

## 2023-05-14 MED ORDER — FUROSEMIDE 20 MG PO TABS: 40.0000 mg | ORAL_TABLET | Freq: Every day | ORAL | 1 refills | Status: DC

## 2023-05-14 MED ORDER — LOSARTAN POTASSIUM 100 MG PO TABS: 100.0000 mg | ORAL_TABLET | Freq: Every day | ORAL | 1 refills | Status: DC

## 2023-05-14 MED ORDER — ZEPBOUND 7.5 MG/0.5ML ~~LOC~~ SOAJ
7.5000 mg | SUBCUTANEOUS | 0 refills | Status: DC
  Filled 2023-05-14: qty 2, 28d supply, fill #0

## 2023-05-14 NOTE — Progress Notes (Signed)
 Patient ID: Darren Carlson, male  DOB: November 14, 1975, 48 y.o.   MRN: [id number] Patient Care Team    Relationship Specialty Notifications Start End  Catherine Charlies LABOR, DO PCP - General Family Medicine  01/23/17   Lucas Dorise POUR, MD Consulting Physician Cardiothoracic Surgery  05/03/17   Bernie Lamar PARAS, MD Consulting Physician Cardiology  05/03/17   Leonce Katz, DO Consulting Physician Sports Medicine  12/21/22     Chief Complaint  Patient presents with   Hypertension    Pt is fasting.    Hyperlipidemia    Wt loss counseling    Subjective:Darren Carlson is a 48 y.o. male present for chronic condition management Darren Carlson is a 48 y.o.  male present for follow-up on Body mass index is 47.55 kg/m. LBS: 328> 316> 331 BMI: 47.09> 45.43> 47 Tolerating Wegovy  taper, on  Wegovy  1.7 mg Changing diet, exercising more again.  Over the holidays he cut back on the dietary and exercise changes and regained weight quickly.  hypertension/hyperlipidemia/acute combined systolic and diastolic heart failure/cardiomyopathy Reports compliance with losartan  100 mg daily, Lasix  40 mg daily and Lipitor.  Patient denies chest pain, shortness of breath, dizziness or lower extremity edema.   Coreg  discontinued and metoprolol  XL discontinued (by pt) secondary to side effects. He has established with cardiology and CTS. patient does not have a thoracic aortic aneurysm.  Repeat imaging ruled out aneurysm- artifact caused misdiagnosis.  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 EXAM: CT ANGIOGRAPHY CHEST WITH CONTRAST CONTRAST:  100mL ISOVUE -370 IOPAMIDOL  (ISOVUE -370) INJECTION 76% COMPARISON:  03/22/2017 IMPRESSION: Previous aortic dimensions are felt to be overestimated due to motion artifact. The ascending  thoracic aorta actually measures 4 cm in greatest diameter, which is at the upper limits of normal.   CT anterior chest 03/12/2017: IMPRESSION: 1. Ascending thoracic aorta measures 4.7 x 4.4 cm.   2.  No demonstrable pulmonary embolus. 3. There is prominence of the main pulmonary outflow tract with a measured transverse diameter of 3.9 cm. This finding is concerning for underlying pulmonary arterial hypertension. 4.  No lung edema or consolidation. 5.  No evident thoracic adenopathy.  Echocardiogram 04/26/2017: LV EF: 55-65 % Study Conclusions - Left ventricle: The cavity size was normal. There was moderate   concentric hypertrophy. Systolic function was normal. The   estimated ejection fraction was in the range of 55% to 65%. Wall   motion was normal; there were no regional wall motion   abnormalities. The study is not technically sufficient to allow   evaluation of LV diastolic function. - Aortic valve: Valve area (VTI): 3.44 cm^2. Valve area (Vmax):   3.05 cm^2. Valve area (Vmean): 2.95 cm^2. Impressions: - Normal LVEF.   Moderate LVH.   Trace MR.      07/18/2022    9:31 AM 02/02/2021   10:27 AM 09/27/2020    9:40 AM 12/23/2019   10:52 AM 06/24/2018    9:15 AM  Depression screen PHQ 2/9  Decreased Interest 0 0 0 0 0  Down, Depressed, Hopeless 0 0 0 0 0  PHQ - 2 Score 0 0 0 0 0  Altered sleeping     0  Tired, decreased energy     0  Change in appetite     1  Feeling bad or failure about yourself      0  Trouble concentrating     0  Moving slowly or fidgety/restless     0  Suicidal thoughts     0  PHQ-9 Score     1  Difficult doing work/chores     Not difficult at all    Past Medical History:  Diagnosis Date   Asthma    Environmental allergies    GERD (gastroesophageal reflux disease)    Hemorrhoids    Obesity    Allergies  Allergen Reactions   Amoxicillin Rash   Penicillins Rash   Past Surgical History:  Procedure Laterality Date   WISDOM TOOTH  EXTRACTION     Family History  Problem Relation Age of Onset   Colon cancer Maternal Grandmother 35       early death   Diabetes Paternal Grandmother    Kidney disease Paternal Grandmother    Diabetes Paternal Grandfather    Kidney disease Paternal Grandfather    Colon cancer Maternal Aunt 35   Colon cancer Cousin    Stomach cancer Neg Hx    Pancreatic cancer Neg Hx    Esophageal cancer Neg Hx    Social History   Socioeconomic History   Marital status: Married    Spouse name: [place]ie   Number of children: 2   Years of education: 16   Highest education level: Bachelor's degree (e.g., BA, AB, BS)  Occupational History   Occupation: sports administrator  Tobacco Use   Smoking status: Never   Smokeless tobacco: Never  Vaping Use   Vaping status: Never Used  Substance and Sexual Activity   Alcohol use: Yes    Comment: occasional   Drug use: No   Sexual activity: Yes    Partners: Female    Birth control/protection: None  Other Topics Concern   Not on file  Social History Narrative   Married to [place], 2 children.    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 Drinks caffeine.    Smoke alarm in the home. Wears seatbelt.    Feels safe in his relationships.    Social Drivers of Corporate Investment Banker Strain: Low Risk  (07/17/2022)   Overall Financial Resource Strain (CARDIA)    Difficulty of Paying Living Expenses: Not very hard  Food Insecurity: No Food Insecurity (07/17/2022)   Hunger Vital Sign    Worried About Running Out of Food in the Last Year: Never true    Ran Out of Food in the Last Year: Never true  Transportation Needs: No Transportation Needs (07/17/2022)   PRAPARE - Administrator, Civil Service (Medical): No    Lack of Transportation (Non-Medical): No  Physical Activity: Sufficiently Active (07/17/2022)   Exercise Vital Sign    Days of Exercise per Week: 7 days    Minutes of Exercise per Session: 60 min  Stress:  No Stress Concern Present (07/17/2022)   [employer] of Occupational Health - Occupational Stress Questionnaire    Feeling of Stress : Not at all  Social Connections: Socially Integrated (07/17/2022)   Social Connection and Isolation Panel [NHANES]    Frequency of Communication with Friends and Family: More than three times a week    Frequency of Social Gatherings with Friends and Family: Once a week    Attends Religious Services: More than 4 times per year    Active Member of [employer] or Organizations: Yes    Attends Banker Meetings: More than 4 times per year  Marital Status: Married  Catering Manager Violence: Unknown (07/11/2021)   Received [facility]   HITS    Physically Hurt: Not on file    Insult or Talk Down To: Not on file    Threaten Physical Harm: Not on file    Scream or Curse: Not on file   Allergies as of 05/14/2023       Reactions   Amoxicillin Rash   Penicillins Rash        Medication List        Accurate as of May 14, 2023 12:32 PM. If you have any questions, ask your nurse or doctor.          STOP taking these medications    Wegovy  1 MG/0.5ML Soaj Generic drug: Semaglutide -Weight Management Stopped by: Charlies Bellini   Wegovy  1.7 MG/0.75ML Soaj Generic drug: Semaglutide -Weight Management Stopped by: Charlies Bellini   Wegovy  2.4 MG/0.75ML Soaj Generic drug: Semaglutide -Weight Management Stopped by: Charlies Bellini       TAKE these medications    atorvastatin  40 MG tablet Commonly known as: LIPITOR Take 1 tablet (40 mg total) by mouth daily.   furosemide  20 MG tablet Commonly known as: LASIX  Take 2 tablets (40 mg total) by mouth daily.   losartan  100 MG tablet Commonly known as: COZAAR  Take 1 tablet (100 mg total) by mouth daily.   Zepbound  7.5 MG/0.5ML Pen Generic drug: tirzepatide  Inject 7.5 mg into the skin once a week. Started by: Charlies Bellini   Zepbound  10 MG/0.5ML Pen Generic drug:  tirzepatide  Inject 10 mg into the skin once a week. Start taking on: June 04, 2023 Started by: Charlies Bellini        Patient was never admitted.   ROS: 14 pt review of systems performed and negative (unless mentioned in an HPI)  Objective: BP 136/88   Pulse 70   Temp 97.6 F (36.4 C)   Ht 5' 10 (1.778 m)   Wt (!) 331 lb 6.4 oz (150.3 kg)   SpO2 98%   BMI 47.55 kg/m   Physical Exam Vitals and nursing note reviewed.  Constitutional:      General: He is not in acute distress.    Appearance: Normal appearance. He is not ill-appearing, toxic-appearing or diaphoretic.  HENT:     Head: Normocephalic and atraumatic.  Eyes:     General: No scleral icterus.       Right eye: No discharge.        Left eye: No discharge.     Extraocular Movements: Extraocular movements intact.     Pupils: Pupils are equal, round, and reactive to light.  Skin:    General: Skin is warm and dry.     Coloration: Skin is not jaundiced or pale.     Findings: No rash.  Neurological:     Mental Status: He is alert and oriented to person, place, and time. Mental status is at baseline.  Psychiatric:        Mood and Affect: Mood normal.        Behavior: Behavior normal.        Thought Content: Thought content normal.        Judgment: Judgment normal.    No results found for this or any previous visit (from the past 24 hours).   Assessment/plan: Darren Carlson is a 48 y.o. male present for  morbid obesity/hypertension/diastolic heart failure (HCC)/hyperlipidemia goal less than 70 LDL Mildly elevated today, he has gained some weight back.  Will continue regimen as is for now in hopes that he again starts to lose weight and blood pressure responds well. - Ejection fraction improved, return to normal. LVH, trace MR. - Thoracic aortic aneurysm --> repeat imaging felt he did not have an  aneurysm and original was artifact.  - prior med: amlodipine , aldactone , coreg  (SE), metoprolol  (SE). Continue  losartan  100 mg daily (extra benefit w/ gout) Continue Lasix  40 - DIScontinue Aldactone  50 mg daily.   Continue daily baby aspirin. Continue atorvastatin  40 mg daily (he has refused 80 mg ) - Continue follow-up per routine schedule with cardiology  Labs: CBC, CMP, lipids and TSH collected today We discussed switching from Wegovy  to Zepbound  and he is agreeable to this today.  Discontinue Wegovy , start Zepbound  7.5 mg weekly injection for 4 weeks, then taper to Zepbound  10 mg weekly injection.  If not approved by insurance, patient does pay out-of-pocket for this medication.  Return in about 6 weeks (around 06/25/2023) for Routine chronic condition follow-up.   Orders Placed This Encounter  Procedures   CBC   Comp Met (CMET)   Lipid panel   TSH   Meds ordered this encounter  Medications   losartan  (COZAAR ) 100 MG tablet    Sig: Take 1 tablet (100 mg total) by mouth daily.    Dispense:  90 tablet    Refill:  1   furosemide  (LASIX ) 20 MG tablet    Sig: Take 2 tablets (40 mg total) by mouth daily.    Dispense:  180 tablet    Refill:  1   DISCONTD: ZEPBOUND  7.5 MG/0.5ML Pen    Sig: Inject 7.5 mg into the skin once a week.    Dispense:  2 mL    Refill:  0    Pt is paying out of pocket if not covered.   ZEPBOUND  7.5 MG/0.5ML Pen    Sig: Inject 7.5 mg into the skin once a week.    Dispense:  2 mL    Refill:  0    Pt is paying out of pocket if not covered. DC wegovy    ZEPBOUND  10 MG/0.5ML Pen    Sig: Inject 10 mg into the skin once a week.    Dispense:  2 mL    Refill:  0    Please fill the Zepbound  7.5 mg first    Note is dictated utilizing voice recognition software. Although note has been proof read prior to signing, occasional typographical errors still can be missed. If any questions arise, please do not hesitate to call for verification.   Electronically signed by: Charlies Bellini, DO [facility] [place]

## 2023-05-14 NOTE — Patient Instructions (Signed)

## 2023-05-15 ENCOUNTER — Encounter: Payer: Self-pay | Admitting: Family Medicine

## 2023-05-21 ENCOUNTER — Other Ambulatory Visit (HOSPITAL_BASED_OUTPATIENT_CLINIC_OR_DEPARTMENT_OTHER): Payer: Self-pay

## 2023-06-22 ENCOUNTER — Other Ambulatory Visit (HOSPITAL_BASED_OUTPATIENT_CLINIC_OR_DEPARTMENT_OTHER): Payer: Self-pay

## 2023-07-05 ENCOUNTER — Ambulatory Visit: Payer: BC Managed Care – PPO | Admitting: Family Medicine

## 2023-07-05 ENCOUNTER — Other Ambulatory Visit (HOSPITAL_BASED_OUTPATIENT_CLINIC_OR_DEPARTMENT_OTHER): Payer: Self-pay

## 2023-07-05 ENCOUNTER — Encounter: Payer: Self-pay | Admitting: Family Medicine

## 2023-07-05 VITALS — BP 134/84 | HR 71 | Temp 98.2°F | Wt 334.0 lb

## 2023-07-05 DIAGNOSIS — E785 Hyperlipidemia, unspecified: Secondary | ICD-10-CM

## 2023-07-05 DIAGNOSIS — I5032 Chronic diastolic (congestive) heart failure: Secondary | ICD-10-CM | POA: Diagnosis not present

## 2023-07-05 DIAGNOSIS — I420 Dilated cardiomyopathy: Secondary | ICD-10-CM

## 2023-07-05 DIAGNOSIS — Z6841 Body Mass Index (BMI) 40.0 and over, adult: Secondary | ICD-10-CM

## 2023-07-05 DIAGNOSIS — I10 Essential (primary) hypertension: Secondary | ICD-10-CM

## 2023-07-05 MED ORDER — ATORVASTATIN CALCIUM 40 MG PO TABS: 40.0000 mg | ORAL_TABLET | Freq: Every day | ORAL | 3 refills | Status: DC

## 2023-07-05 MED ORDER — ZEPBOUND 12.5 MG/0.5ML ~~LOC~~ SOAJ
12.5000 mg | SUBCUTANEOUS | 0 refills | Status: DC
  Filled 2023-07-05: qty 2, 28d supply, fill #0

## 2023-07-05 MED ORDER — ZEPBOUND 15 MG/0.5ML ~~LOC~~ SOAJ
15.0000 mg | SUBCUTANEOUS | 5 refills | Status: DC
  Filled 2023-07-05 – 2023-07-29 (×2): qty 2, 28d supply, fill #0
  Filled 2023-09-07: qty 2, 28d supply, fill #1

## 2023-07-05 NOTE — Patient Instructions (Addendum)
 Return in about 3 months (around 09/30/2023).        Great to see you today.  I have refilled the medication(s) we provide.   If labs were collected or images ordered, we will inform you of  results once we have received them and reviewed. We will contact you either by echart message, or telephone call.  Please give ample time to the testing facility, and our office to run,  receive and review results. Please do not call inquiring of results, even if you can see them in your chart. We will contact you as soon as we are able. If it has been over 1 week since the test was completed, and you have not yet heard from [place], then please call us.    - echart message- for normal results that have been seen by the patient already.   - telephone call: abnormal results or if patient has not viewed results in their echart.  If a referral to a specialist was entered for you, please call us in 2 weeks if you have not heard from the specialist office to schedule.

## 2023-07-05 NOTE — Progress Notes (Signed)
 Patient ID: Darren Carlson, male  DOB: 04-Jun-1975, 49 y.o.   MRN: [id number] Patient Care Team    Relationship Specialty Notifications Start End  Natalia Leatherwood, DO PCP - General Family Medicine  01/23/17   Alleen Borne, MD Consulting Physician Cardiothoracic Surgery  05/03/17   Georgeanna Lea, MD Consulting Physician Cardiology  05/03/17   Richardean Sale, DO Consulting Physician Sports Medicine  12/21/22     Chief Complaint  Patient presents with   Hypertension   Hyperlipidemia    Subjective:Darren Carlson is a 48 y.o. male present for chronic condition management  Body mass index is 47.92 kg/m. LBS: 328> 316> 331> 334 BMI: 47.09> 45.43> 47> 47 Tolerating mounjaro taper, now on 10 mg weekly.  Not noticing any negative SE. Reports had some weight loss, but recent vacation he put weight back on.  Changing diet, exercising more again.   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  Patient denies chest pain, shortness of breath, dizziness or lower extremity edema.   Coreg discontinued and metoprolol XL discontinued (by pt) secondary to side effects. He has established with cardiology and CTS. patient does not have a thoracic aortic aneurysm.  Repeat imaging ruled out aneurysm- artifact caused misdiagnosis.  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 EXAM: CT ANGIOGRAPHY CHEST WITH CONTRAST CONTRAST:  ISOVUE-370 IOPAMIDOL (ISOVUE-370) INJECTION 76% COMPARISON:  03/22/2017 IMPRESSION: Previous aortic dimensions are felt to be overestimated due to motion artifact. The ascending thoracic aorta actually measures 4 cm in greatest diameter, which is at  the upper limits of normal.   CT anterior chest 03/12/2017: IMPRESSION: 1. Ascending thoracic aorta measures 4.7 x 4.4 cm.   2.  No demonstrable pulmonary embolus. 3. There is prominence of the main pulmonary outflow tract with a measured transverse diameter of 3.9 cm. This finding is concerning for underlying pulmonary arterial hypertension. 4.  No lung edema or consolidation. 5.  No evident thoracic adenopathy.  Echocardiogram 04/26/2017: LV EF: 55-65 % Study Conclusions - Left ventricle: The cavity size was normal. There was moderate   concentric hypertrophy. Systolic function was normal. The   estimated ejection fraction was in the range of 55% to 65%. Wall   motion was normal; there were no regional wall motion   abnormalities. The study is not technically sufficient to allow   evaluation of LV diastolic function. - Aortic valve: Valve area (VTI): 3.44 cm^2. Valve area (Vmax):   3.05 cm^2. Valve area (Vmean): 2.95 cm^2. Impressions: - Normal LVEF.   Moderate LVH.   Trace MR.      07/18/2022    9:31 AM 02/02/2021   10:27 AM 09/27/2020    9:40 AM 12/23/2019   10:52 AM 06/24/2018    9:15 AM  Depression screen PHQ 2/9  Decreased Interest 0 0 0 0 0  Down, Depressed, Hopeless 0 0 0 0 0  PHQ - 2 Score 0 0 0 0 0  Altered sleeping     0  Tired, decreased energy     0  Change in appetite     1  Feeling bad or failure about yourself      0  Trouble concentrating     0  Moving slowly or fidgety/restless     0  Suicidal thoughts     0  PHQ-9 Score     1  Difficult doing work/chores     Not difficult at all    Past Medical History:  Diagnosis Date   Asthma    Environmental allergies    GERD (gastroesophageal reflux disease)    Hemorrhoids    Obesity    Allergies  Allergen Reactions   Amoxicillin Rash   Penicillins Rash   Past Surgical History:  Procedure Laterality Date   WISDOM TOOTH EXTRACTION     Family History  Problem Relation Age of Onset   Colon cancer  Maternal Grandmother 57       early death   Diabetes Paternal Grandmother    Kidney disease Paternal Grandmother    Diabetes Paternal Grandfather    Kidney disease Paternal Grandfather    Colon cancer Maternal Aunt 35   Colon cancer Cousin    Stomach cancer Neg Hx    Pancreatic cancer Neg Hx    Esophageal cancer Neg Hx    Social History   Socioeconomic History   Marital status: Married    Spouse name: Jaymes Graff   Number of children: 2   Years of education: 16   Highest education level: Bachelor's degree (e.g., BA, AB, BS)  Occupational History   Occupation: Sports administrator  Tobacco Use   Smoking status: Never   Smokeless tobacco: Never  Vaping Use   Vaping status: Never Used  Substance and Sexual Activity   Alcohol use: Yes    Comment: occasional   Drug use: No   Sexual activity: Yes    Partners: Female    Birth control/protection: None  Other Topics Concern   Not on file  Social History Narrative   Married to [place], 2 children.    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 Drinks caffeine.    Smoke alarm in the home. Wears seatbelt.    Feels safe in his relationships.    Social Drivers of Corporate investment banker Strain: Low Risk  (07/17/2022)   Overall Financial Resource Strain (CARDIA)    Difficulty of Paying Living Expenses: Not very hard  Food Insecurity: No Food Insecurity (07/17/2022)   Hunger Vital Sign    Worried About Running Out of Food in the Last Year: Never true    Ran Out of Food in the Last Year: Never true  Transportation Needs: No Transportation Needs (07/17/2022)   PRAPARE - Administrator, Civil Service (Medical): No    Lack of Transportation (Non-Medical): No  Physical Activity: Sufficiently Active (07/17/2022)   Exercise Vital Sign    Days of Exercise per Week: 7 days    Minutes of Exercise per Session: 60 min  Stress: No Stress Concern Present (07/17/2022)   [employer] of Occupational Health  - Occupational Stress Questionnaire    Feeling of Stress : Not at all  Social Connections: Socially Integrated (07/17/2022)   Social Connection and Isolation Panel [NHANES]    Frequency of Communication with Friends and Family: More than three times a week    Frequency of Social Gatherings with Friends and Family: Once a week    Attends Religious Services: More than 4 times per year    Active Member of [employer] or Organizations: Yes    Attends Banker Meetings: More than 4 times per year    Marital Status: Married  Intimate Partner Violence: Unknown (07/11/2021)   Received from [facility]   HITS    Physically Hurt: Not on file    Insult or Talk Down To: Not on file    Threaten Physical Harm: Not on file    Scream or Curse: Not on file   Allergies as of 07/05/2023       Reactions   Amoxicillin Rash   Penicillins Rash        Medication List        Accurate as of July 05, 2023  9:53 AM. If you have any questions, ask your nurse or doctor.          STOP taking these medications    Zepbound 10 MG/0.5ML Pen Generic drug: tirzepatide Replaced by: Zepbound 15 MG/0.5ML Pen Stopped by: Felix Pacini   Zepbound 7.5 MG/0.5ML Pen Generic drug: tirzepatide Replaced by: Zepbound 12.5 MG/0.5ML Pen Stopped by: Felix Pacini       TAKE these medications    atorvastatin 40 MG tablet Commonly known as: LIPITOR Take 1 tablet (40 mg total) by mouth daily.   furosemide 20 MG tablet Commonly known as: LASIX Take 2 tablets (40 mg total) by mouth daily.   losartan 100 MG tablet Commonly known as: COZAAR Take 1 tablet (100 mg total) by mouth daily.   Zepbound 12.5 MG/0.5ML Pen Generic drug: tirzepatide Inject 12.5 mg into the skin once a week. Replaces: Zepbound 7.5 MG/0.5ML Pen Started by: Felix Pacini   Zepbound 15 MG/0.5ML Pen Generic drug: tirzepatide Inject 15 mg into the skin once a week. Start taking on: July 19, 2023 Replaces: Zepbound  10 MG/0.5ML Pen Started by: Felix Pacini        Patient was never admitted.   ROS: 14 pt review of systems performed and negative (unless mentioned in an HPI)  Objective: BP 134/84   Pulse 71   Temp 98.2 F (36.8 C)   Wt (!) 334 lb (151.5 kg)   SpO2 98%   BMI 47.92 kg/m   Physical Exam Vitals and nursing note reviewed.  Constitutional:      General: He is not in acute distress.    Appearance: Normal appearance. He is not ill-appearing, toxic-appearing or diaphoretic.  HENT:     Head: Normocephalic and atraumatic.  Eyes:     General: No scleral icterus.       Right eye: No discharge.        Left eye: No discharge.     Extraocular Movements: Extraocular movements intact.     Pupils: Pupils are equal, round, and reactive to light.  Cardiovascular:     Rate and Rhythm: Normal rate and regular rhythm.     Heart sounds: No murmur heard. Pulmonary:     Effort: Pulmonary effort is normal.  Musculoskeletal:     Right lower leg: No edema.     Left lower leg: No edema.  Skin:    General: Skin is warm and dry.     Coloration: Skin is not jaundiced or pale.     Findings: No rash.  Neurological:     Mental Status: He is alert and oriented to person, place, and time. Mental status is at baseline.  Psychiatric:        Mood and Affect: Mood normal.        Behavior: Behavior normal.        Thought Content: Thought content normal.        Judgment: Judgment normal.    No results found for this or any previous visit (from the  past 24 hours).   Assessment/plan: Lymon Kidney is a 48 y.o. male present for  morbid obesity/hypertension/diastolic heart failure (HCC)/hyperlipidemia goal less than 70 LDL  - Ejection fraction improved, return to normal. LVH, trace MR. - Thoracic aortic aneurysm --> repeat imaging felt he did not have an  aneurysm and original was artifact.  - prior med: amlodipine, aldactone, coreg (SE), metoprolol (SE). Continue losartan 100 mg daily (extra benefit  w/ gout) Continue Lasix 40 - DIScontinue Aldactone 50 mg daily.   Continue daily baby aspirin. Continue atorvastatin 40 mg daily (he has refused 80 mg ) - Continue follow-up per routine schedule with cardiology  Labs: UTD Increase  Zepbound 12.5 mg weekly injection for 4 weeks, then taper to Zepbound 15 mg weekly injection.    Return in about 3 months (around 09/30/2023).   No orders of the defined types were placed in this encounter.  Meds ordered this encounter  Medications   atorvastatin (LIPITOR) 40 MG tablet    Sig: Take 1 tablet (40 mg total) by mouth daily.    Dispense:  90 tablet    Refill:  3   tirzepatide (ZEPBOUND) 12.5 MG/0.5ML Pen    Sig: Inject 12.5 mg into the skin once a week.    Dispense:  2 mL    Refill:  0   tirzepatide (ZEPBOUND) 15 MG/0.5ML Pen    Sig: Inject 15 mg into the skin once a week.    Dispense:  2 mL    Refill:  5    Note is dictated utilizing voice recognition software. Although note has been proof read prior to signing, occasional typographical errors still can be missed. If any questions arise, please do not hesitate to call for verification.   Electronically signed by: Felix Pacini, DO [facility] [place]

## 2023-07-30 ENCOUNTER — Other Ambulatory Visit (HOSPITAL_BASED_OUTPATIENT_CLINIC_OR_DEPARTMENT_OTHER): Payer: Self-pay

## 2023-08-01 ENCOUNTER — Other Ambulatory Visit (HOSPITAL_BASED_OUTPATIENT_CLINIC_OR_DEPARTMENT_OTHER): Payer: Self-pay

## 2023-08-14 ENCOUNTER — Encounter: Payer: Self-pay | Admitting: Family Medicine

## 2023-09-09 ENCOUNTER — Other Ambulatory Visit (HOSPITAL_BASED_OUTPATIENT_CLINIC_OR_DEPARTMENT_OTHER): Payer: Self-pay

## 2023-09-30 ENCOUNTER — Other Ambulatory Visit: Payer: Self-pay | Admitting: Family Medicine

## 2023-09-30 ENCOUNTER — Encounter: Payer: Self-pay | Admitting: Family Medicine

## 2023-09-30 ENCOUNTER — Ambulatory Visit: Admitting: Family Medicine

## 2023-09-30 VITALS — BP 132/82 | HR 80 | Temp 98.1°F | Wt 328.0 lb

## 2023-09-30 DIAGNOSIS — Z6841 Body Mass Index (BMI) 40.0 and over, adult: Secondary | ICD-10-CM

## 2023-09-30 DIAGNOSIS — I10 Essential (primary) hypertension: Secondary | ICD-10-CM | POA: Diagnosis not present

## 2023-09-30 DIAGNOSIS — I5032 Chronic diastolic (congestive) heart failure: Secondary | ICD-10-CM

## 2023-09-30 DIAGNOSIS — E785 Hyperlipidemia, unspecified: Secondary | ICD-10-CM

## 2023-09-30 DIAGNOSIS — I420 Dilated cardiomyopathy: Secondary | ICD-10-CM

## 2023-09-30 MED ORDER — LOSARTAN POTASSIUM 100 MG PO TABS: 100.0000 mg | ORAL_TABLET | Freq: Every day | ORAL | 1 refills | Status: DC

## 2023-09-30 MED ORDER — FUROSEMIDE 20 MG PO TABS: 40.0000 mg | ORAL_TABLET | Freq: Every day | ORAL | 1 refills | Status: DC

## 2023-09-30 MED ORDER — ZEPBOUND 15 MG/0.5ML ~~LOC~~ SOAJ: 15.0000 mg | SUBCUTANEOUS | 5 refills | Status: DC

## 2023-09-30 NOTE — Patient Instructions (Addendum)
 Return in about 24 weeks (around 03/16/2024) for Routine chronic condition follow-up.   Start probiotic daily - provitalize is a decent probiotic.  Make sure to drink at least 80 - 100 ounces of water daily  Protein and low glycemic vegetable/fruit only as meals.  Exercise: all muscle groups moving - cardiovascular.          Great to see you today.  I have refilled the medication(s) we provide.   If labs were collected or images ordered, we will inform you of  results once we have received them and reviewed. We will contact you either by echart message, or telephone call.  Please give ample time to the testing facility, and our office to run,  receive and review results. Please do not call inquiring of results, even if you can see them in your chart. We will contact you as soon as we are able. If it has been over 1 week since the test was completed, and you have not yet heard from us , then please call us .    - echart message- for normal results that have been seen by the patient already.   - telephone call: abnormal results or if patient has not viewed results in their echart.  If a referral to a specialist was entered for you, please call us  in 2 weeks if you have not heard from the specialist office to schedule.

## 2023-09-30 NOTE — Progress Notes (Signed)
 Patient ID: Darren Carlson, male  DOB: Feb 27, 1976, 48 y.o.   MRN: [id number] Patient Care Team    Relationship Specialty Notifications Start End  Catherine Charlies LABOR, DO PCP - General Family Medicine  01/23/17   Lucas Dorise POUR, MD Consulting Physician Cardiothoracic Surgery  05/03/17   Bernie Lamar PARAS, MD Consulting Physician Cardiology  05/03/17   Leonce Katz, DO Consulting Physician Sports Medicine  12/21/22     Chief Complaint  Patient presents with   Hypertension    Subjective:Darren Carlson is a 48 y.o. male present for chronic condition management  Body mass index is 47.06 kg/m. LBS: 328> 316> 331> 334> 328 BMI: 47.09> 45.43> 47> 47> 47 Tolerating mounjaro  taper, now on 10 mg weekly.  Not noticing any negative SE. Reports had some weight loss, but recent vacation he put weight back on.  Changing diet, exercising more again.   hypertension/hyperlipidemia/acute combined systolic and diastolic heart failure/cardiomyopathy Reports compliance with losartan  100 mg daily, Lasix  40 mg daily and Lipitor.  Patient denies chest pain, shortness of breath, dizziness or lower extremity edema.  Coreg  discontinued and metoprolol  XL discontinued (by pt) secondary to side effects. He has established with cardiology and CTS. patient does not have a thoracic aortic aneurysm.  Repeat imaging ruled out aneurysm- artifact caused misdiagnosis.  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 EXAM: CT ANGIOGRAPHY CHEST WITH CONTRAST CONTRAST:  100mL ISOVUE -370 IOPAMIDOL  (ISOVUE -370) INJECTION 76% COMPARISON:  03/22/2017 IMPRESSION: Previous aortic dimensions are felt to be overestimated due to motion artifact. The ascending thoracic aorta actually measures 4 cm in greatest diameter, which is at the upper  limits of normal.   CT anterior chest 03/12/2017: IMPRESSION: 1. Ascending thoracic aorta measures 4.7 x 4.4 cm.   2.  No demonstrable pulmonary embolus. 3. There is prominence of the main pulmonary outflow tract with a measured transverse diameter of 3.9 cm. This finding is concerning for underlying pulmonary arterial hypertension. 4.  No lung edema or consolidation. 5.  No evident thoracic adenopathy.  Echocardiogram 04/26/2017: LV EF: 55-65 % Study Conclusions - Left ventricle: The cavity size was normal. There was moderate   concentric hypertrophy. Systolic function was normal. The   estimated ejection fraction was in the range of 55% to 65%. Wall   motion was normal; there were no regional wall motion   abnormalities. The study is not technically sufficient to allow   evaluation of LV diastolic function. - Aortic valve: Valve area (VTI): 3.44 cm^2. Valve area (Vmax):   3.05 cm^2. Valve area (Vmean): 2.95 cm^2. Impressions: - Normal LVEF.   Moderate LVH.   Trace MR.      07/05/2023   11:21 AM 07/18/2022    9:31 AM 02/02/2021   10:27 AM 09/27/2020    9:40 AM 12/23/2019   10:52 AM  Depression screen PHQ 2/9  Decreased Interest 0 0 0 0 0  Down, Depressed, Hopeless 0 0 0 0 0  PHQ - 2 Score 0 0 0 0 0    Past Medical History:  Diagnosis Date   Asthma    Environmental allergies    GERD (gastroesophageal reflux disease)    Hemorrhoids    Obesity    Allergies  Allergen Reactions   Amoxicillin Rash   Penicillins Rash   Past Surgical History:  Procedure Laterality Date   WISDOM TOOTH EXTRACTION  Family History  Problem Relation Age of Onset   Colon cancer Maternal Grandmother 46       early death   Diabetes Paternal Grandmother    Kidney disease Paternal Grandmother    Diabetes Paternal Grandfather    Kidney disease Paternal Grandfather    Colon cancer Maternal Aunt 35   Colon cancer Cousin    Stomach cancer Neg Hx    Pancreatic cancer Neg Hx     Esophageal cancer Neg Hx    Social History   Socioeconomic History   Marital status: Married    Spouse name: [place]   Number of children: 2   Years of education: 16   Highest education level: Bachelor's degree (e.g., BA, AB, BS)  Occupational History   Occupation: Sports administrator  Tobacco Use   Smoking status: Never   Smokeless tobacco: Never  Vaping Use   Vaping status: Never Used  Substance and Sexual Activity   Alcohol use: Yes    Comment: occasional   Drug use: No   Sexual activity: Yes    Partners: Female    Birth control/protection: None  Other Topics Concern   Not on file  Social History Narrative   Married to [place], 2 children.    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 Drinks caffeine.    Smoke alarm in the home. Wears seatbelt.    Feels safe in his relationships.    Social Drivers of Corporate investment banker Strain: Low Risk  (07/17/2022)   Overall Financial Resource Strain (CARDIA)    Difficulty of Paying Living Expenses: Not very hard  Food Insecurity: No Food Insecurity (07/17/2022)   Hunger Vital Sign    Worried About Running Out of Food in the Last Year: Never true    Ran Out of Food in the Last Year: Never true  Transportation Needs: No Transportation Needs (07/17/2022)   PRAPARE - Administrator, Civil Service (Medical): No    Lack of Transportation (Non-Medical): No  Physical Activity: Sufficiently Active (07/17/2022)   Exercise Vital Sign    Days of Exercise per Week: 7 days    Minutes of Exercise per Session: 60 min  Stress: No Stress Concern Present (07/17/2022)   [employer] of Occupational Health - Occupational Stress Questionnaire    Feeling of Stress : Not at all  Social Connections: Socially Integrated (07/17/2022)   Social Connection and Isolation Panel    Frequency of Communication with Friends and Family: More than three times a week    Frequency of Social Gatherings with Friends and  Family: Once a week    Attends Religious Services: More than 4 times per year    Active Member of [employer] or Organizations: Yes    Attends Banker Meetings: More than 4 times per year    Marital Status: Married  Catering manager Violence: Unknown (07/11/2021)   Received from [facility]   HITS    Physically Hurt: Not on file    Insult or Talk Down To: Not on file    Threaten Physical Harm: Not on file    Scream or Curse: Not on file   Allergies as of 09/30/2023       Reactions   Amoxicillin Rash   Penicillins Rash        Medication List        Accurate as of September 30, 2023 10:23 AM. If you have any questions, ask your nurse or  doctor.          atorvastatin  40 MG tablet Commonly known as: LIPITOR Take 1 tablet (40 mg total) by mouth daily.   furosemide  20 MG tablet Commonly known as: LASIX  Take 2 tablets (40 mg total) by mouth daily.   losartan  100 MG tablet Commonly known as: COZAAR  Take 1 tablet (100 mg total) by mouth daily.   Zepbound  15 MG/0.5ML Pen Generic drug: tirzepatide  Inject 15 mg into the skin once a week. What changed: Another medication with the same name was removed. Continue taking this medication, and follow the directions you see here. Changed by: Charlies Bellini        Patient was never admitted.   ROS: 14 pt review of systems performed and negative (unless mentioned in an HPI)  Objective: BP 132/82   Pulse 80   Temp 98.1 F (36.7 C)   Wt (!) 328 lb (148.8 kg)   SpO2 97%   BMI 47.06 kg/m   Physical Exam Vitals and nursing note reviewed.  Constitutional:      General: He is not in acute distress.    Appearance: Normal appearance. He is obese. He is not ill-appearing, toxic-appearing or diaphoretic.  HENT:     Head: Normocephalic and atraumatic.   Eyes:     General: No scleral icterus.       Right eye: No discharge.        Left eye: No discharge.     Extraocular Movements: Extraocular movements intact.     Pupils:  Pupils are equal, round, and reactive to light.    Cardiovascular:     Rate and Rhythm: Normal rate and regular rhythm.     Heart sounds: No murmur heard. Pulmonary:     Effort: Pulmonary effort is normal.   Musculoskeletal:     Right lower leg: No edema.     Left lower leg: No edema.   Skin:    General: Skin is warm and dry.     Coloration: Skin is not jaundiced or pale.     Findings: No rash.   Neurological:     Mental Status: He is alert and oriented to person, place, and time. Mental status is at baseline.   Psychiatric:        Mood and Affect: Mood normal.        Behavior: Behavior normal.        Thought Content: Thought content normal.        Judgment: Judgment normal.    No results found for this or any previous visit (from the past 24 hours).   Assessment/plan: Imraan Wendell is a 48 y.o. male present for  morbid obesity/hypertension/diastolic heart failure (HCC)/hyperlipidemia goal less than 70 LDL Continue losartan  100 mg daily (extra benefit w/ gout) Continue Lasix  40 Continue daily baby aspirin. Continue atorvastatin  40 mg daily (he has refused 80 mg ) - Ejection fraction improved, return to normal. LVH, trace MR. - NEGATIVE Thoracic aortic aneurysm --> repeat imaging felt he did not have an  aneurysm and original was artifact.  - prior med: amlodipine , aldactone , coreg  (SE), metoprolol  (SE). - DIScontinue Aldactone  50 mg daily.   - Continue folllw-up per routine schedule with cardiology  Labs: UTD 05/2023 Continue  Zepbound  15 mg weekly injection.    Return in about 24 weeks (around 03/16/2024) for Routine chronic condition follow-up.   No orders of the defined types were placed in this encounter.  Meds ordered this encounter  Medications   losartan  (  COZAAR ) 100 MG tablet    Sig: Take 1 tablet (100 mg total) by mouth daily.    Dispense:  90 tablet    Refill:  1   furosemide  (LASIX ) 20 MG tablet    Sig: Take 2 tablets (40 mg total) by mouth daily.     Dispense:  180 tablet    Refill:  1   tirzepatide  (ZEPBOUND ) 15 MG/0.5ML Pen    Sig: Inject 15 mg into the skin once a week.    Dispense:  2 mL    Refill:  5    Note is dictated utilizing voice recognition software. Although note has been proof read prior to signing, occasional typographical errors still can be missed. If any questions arise, please do not hesitate to call for verification.   Electronically signed by: Charlies Bellini, DO [facility] [place]

## 2023-10-01 ENCOUNTER — Telehealth: Payer: Self-pay

## 2023-10-01 ENCOUNTER — Other Ambulatory Visit (HOSPITAL_COMMUNITY): Payer: Self-pay

## 2023-10-01 NOTE — Telephone Encounter (Signed)
 Pharmacy Patient Advocate Encounter   Received notification from CoverMyMeds that prior authorization for (ZEPBOUND ) 15 MG/0.5ML Pen is required/requested.   Insurance verification completed.   The patient is insured through [facility] .   Per test claim: PA required; PA submitted to above mentioned insurance via CoverMyMeds Key/confirmation #/EOC [id number] Status is pending

## 2023-10-02 NOTE — Telephone Encounter (Signed)
 Pt Zepbound  has been denied by insurance

## 2023-10-02 NOTE — Telephone Encounter (Signed)
 No further action needed.

## 2023-10-02 NOTE — Telephone Encounter (Signed)
 Pharmacy Patient Advocate Encounter  Received notification from [facility] that Prior Authorization for Zepbound  15MG /0.5ML pen-injectors  has been DENIED.  See denial reason below. No denial letter attached in CMM. Will attach denial letter to Media tab once received. Denied. This health benefit plan does not cover the following services, supplies, drugs or charges: Any treatment or regimen, medical or surgical, for the purpose of reducing or controlling the weight of the member, or for the treatment of obesity, except for surgical treatment of morbid obesity, or as specifically covered by this health benefit plan.

## 2023-10-02 NOTE — Telephone Encounter (Signed)
Patient pays out of pocket for medication.

## 2023-10-15 ENCOUNTER — Encounter: Payer: Self-pay | Admitting: Family Medicine

## 2023-10-15 ENCOUNTER — Other Ambulatory Visit (HOSPITAL_BASED_OUTPATIENT_CLINIC_OR_DEPARTMENT_OTHER): Payer: Self-pay

## 2023-10-15 ENCOUNTER — Other Ambulatory Visit: Payer: Self-pay | Admitting: Family Medicine

## 2023-10-16 ENCOUNTER — Other Ambulatory Visit (HOSPITAL_BASED_OUTPATIENT_CLINIC_OR_DEPARTMENT_OTHER): Payer: Self-pay

## 2023-10-16 MED ORDER — ZEPBOUND 15 MG/0.5ML ~~LOC~~ SOAJ
15.0000 mg | SUBCUTANEOUS | 5 refills | Status: DC
  Filled 2023-10-16: qty 2, 28d supply, fill #0
  Filled 2023-11-06: qty 2, 28d supply, fill #1
  Filled 2023-12-16: qty 2, 28d supply, fill #2
  Filled 2024-01-20 – 2024-01-21 (×2): qty 2, 28d supply, fill #3

## 2023-10-17 DIAGNOSIS — E669 Obesity, unspecified: Secondary | ICD-10-CM | POA: Insufficient documentation

## 2023-10-17 DIAGNOSIS — J45909 Unspecified asthma, uncomplicated: Secondary | ICD-10-CM | POA: Insufficient documentation

## 2023-10-17 DIAGNOSIS — K219 Gastro-esophageal reflux disease without esophagitis: Secondary | ICD-10-CM | POA: Insufficient documentation

## 2023-10-17 DIAGNOSIS — K649 Unspecified hemorrhoids: Secondary | ICD-10-CM | POA: Insufficient documentation

## 2023-10-17 DIAGNOSIS — Z9109 Other allergy status, other than to drugs and biological substances: Secondary | ICD-10-CM | POA: Insufficient documentation

## 2023-10-17 NOTE — Progress Notes (Signed)
 Cardiology Office Note   Date:  10/18/2023  ID:  Burech Mcfarland, DOB 08-Aug-1975, MRN [id number] PCP: Catherine Charlies LABOR, DO  [facility] Providers Cardiologist:  Lamar Fitch, MD Cardiology APP:  Carlin Delon BROCKS, NP     History of Present Illness Darren Carlson is a 48 y.o. male with a past medical history of hypertension, cardiomyopathy, obesity.  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 Fitch in 2018 for the evaluation of shortness of breath and cardiomyopathy at the request of his PCP.  He had also been evaluated by [facility] after an enlargement was noted on his aorta however is felt to be overreading and he had a CT of his chest revealing no thoracic aortic aneurysm.  Most recently was evaluated by Dr. Fitch on 02/15/2022, he was doing stable from a cardiac perspective, working on lifestyle modifications and had lost weight, repeat echocardiogram revealed an EF of 60-65%, grade 2 DD, no changes were made to his medications or plan of care and he was advised he can follow-up in 1 year.  He presents today for follow up.  He has been doing relatively well since he was last evaluated on our office.  Over the last few years he has lost approximately 100 pounds however has been at a plateau for the last year or so.  He is currently on tirzepatide  managed by his PCP for this but has not managed to lose any additional weight.  He stays very physically active, hiking 3-4 times a week.  He is also a Copywriter, advertising several businesses.  His blood pressure is very elevated in the office today however he is completely asymptomatic, states he did have an argument with a very  close friend in the previous day and also ate [place] food the night before.  Has not been routinely checking his blood pressure anymore.  We reviewed his blood pressure grafts over the last few years and it appears that his blood pressure typically tends to be well-controlled. He denies chest pain, palpitations, dyspnea, pnd, orthopnea, n, v, dizziness, syncope, edema, weight gain, or early satiety.   ROS: Review of Systems  All other systems reviewed and are negative.    Studies Reviewed      Cardiac Studies & Procedures   ______________________________________________________________________________________________     ECHOCARDIOGRAM  ECHOCARDIOGRAM COMPLETE 03/16/2022  Narrative ECHOCARDIOGRAM REPORT    Patient Name:   Darren Carlson Date of Exam: 03/16/2022 Medical Rec #:  [id number]   Height:       70.0 in Accession #:    [id number]  Weight:       284.8 lb Date of Birth:  1976-02-10   BSA:          2.426 m Patient Age:    46 years    BP:           130/75 mmHg Patient Gender: M           HR:           64 bpm. 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 CHF and Cardiomyopathy; Risk Factors:Hypertension, Dyslipidemia and Non-Smoker. Patient denies chest pain, SOB and leg edema.  Sonographer:    Annabella Cater RVT, RDCS (AE), RDMS Referring Phys: [phone] LAMAR JINNY FITCH   Sonographer Comments: Suboptimal apical window and patient is obese. IMPRESSIONS   1. Left ventricular ejection fraction, by estimation, is 60 to 65%. The left ventricle has normal function. The left ventricle has no regional wall motion abnormalities. There is mild left ventricular hypertrophy. Left ventricular diastolic parameters are consistent with Grade II diastolic dysfunction  (pseudonormalization). 2. Right ventricular systolic function is normal. The right ventricular size is normal. 3. Left atrial size was mild to moderately dilated. 4. The mitral valve is normal in structure. No evidence of mitral valve regurgitation. No evidence of mitral stenosis. 5. The aortic valve is normal in structure. Aortic valve regurgitation is not visualized. No aortic stenosis is present. 6. There is mild dilatation of the ascending aorta, measuring 38 mm. 7. The inferior vena cava is normal in size with greater than 50% respiratory variability, suggesting right atrial pressure of 3 mmHg.  Comparison(s): EF 55%, moderate LVH, asc aor 41mm.  FINDINGS Left Ventricle: Left ventricular ejection fraction, by estimation, is 60 to 65%. The left ventricle has normal function. The left ventricle has no regional wall motion abnormalities. Definity  contrast agent was given IV to delineate the left ventricular endocardial borders. The left ventricular internal cavity size was normal in size. There is mild left ventricular hypertrophy. Left ventricular diastolic parameters are consistent with Grade II diastolic dysfunction (pseudonormalization).  Right Ventricle: The right ventricular size is normal. No increase in right ventricular wall thickness. Right ventricular systolic function is normal.  Left Atrium: Left atrial size was mild to moderately dilated.  Right Atrium: Right atrial size was normal in size.  Pericardium: There is no evidence of pericardial effusion.  Mitral Valve: The mitral valve is normal in structure. No evidence of mitral valve regurgitation. No evidence of mitral valve stenosis.  Tricuspid Valve: The tricuspid valve is normal in structure. Tricuspid valve regurgitation is not demonstrated. No evidence of tricuspid stenosis.  Aortic Valve: The aortic valve is normal in structure. Aortic valve regurgitation is not visualized. No aortic stenosis is present. Aortic valve  mean gradient measures 2.5 mmHg. Aortic valve peak gradient measures 5.4 mmHg. Aortic valve area, by VTI measures 3.64 cm.  Pulmonic Valve: The pulmonic valve was normal in structure. Pulmonic valve regurgitation is not visualized. No evidence of pulmonic stenosis.  Aorta: The aortic root is normal in size and structure. There is mild dilatation of the ascending aorta, measuring 38 mm.  Venous: The inferior vena cava is normal in size with greater than 50% respiratory variability, suggesting right atrial pressure of 3 mmHg.  IAS/Shunts: No atrial level shunt detected by color flow Doppler.   LEFT VENTRICLE PLAX 2D LVIDd:         5.20 cm   Diastology LVIDs:         3.40 cm   LV e' medial:    5.18 cm/s LV PW:         1.40 cm   LV E/e' medial:  17.1 LV IVS:        1.40 cm   LV e' lateral:   6.25 cm/s LVOT diam:     2.30 cm   LV E/e' lateral: 14.2 LV SV:  92 LV SV Index:   38 LVOT Area:     4.15 cm   RIGHT VENTRICLE RV S prime:     14.80 cm/s TAPSE (M-mode): 2.6 cm  LEFT ATRIUM              Index        RIGHT ATRIUM           Index LA diam:        5.10 cm  2.10 cm/m   RA Area:     23.30 cm LA Vol (A2C):   102.0 ml 42.05 ml/m  RA Volume:   69.80 ml  28.78 ml/m LA Vol (A4C):   78.3 ml  32.28 ml/m LA Biplane Vol: 95.2 ml  39.25 ml/m AORTIC VALVE                    PULMONIC VALVE AV Area (Vmax):    3.46 cm     PV Vmax:       0.92 m/s AV Area (Vmean):   3.48 cm     PV Peak grad:  3.4 mmHg AV Area (VTI):     3.64 cm AV Vmax:           116.50 cm/s AV Vmean:          75.850 cm/s AV VTI:            0.252 m AV Peak Grad:      5.4 mmHg AV Mean Grad:      2.5 mmHg LVOT Vmax:         97.00 cm/s LVOT Vmean:        63.600 cm/s LVOT VTI:          0.221 m LVOT/AV VTI ratio: 0.88  AORTA Ao Root diam: 3.90 cm Ao Arch diam: 3.3 cm  MITRAL VALVE               TRICUSPID VALVE MV Area (PHT): 2.97 cm    TR Peak grad:   8.5 mmHg MV Decel Time: 255 msec    TR Vmax:         146.00 cm/s MV E velocity: 88.80 cm/s MV A velocity: 93.00 cm/s  SHUNTS MV E/A ratio:  0.95        Systemic VTI:  0.22 m Systemic Diam: 2.30 cm  Lamar Fitch MD Electronically signed by Lamar Fitch MD Signature Date/Time: 03/17/2022/12:29:37 PM    Final          ______________________________________________________________________________________________      Risk Assessment/Calculations   HYPERTENSION CONTROL Vitals:   10/18/23 1008 10/18/23 1131  BP: (!) 192/108 (!) 180/90    The patient's blood pressure is elevated above target today.  In order to address the patient's elevated BP: Blood pressure will be monitored at home to determine if medication changes need to be made.; A current anti-hypertensive medication was adjusted today.          Physical Exam VS:  BP (!) 180/90   Pulse 62   Ht 5' 10 (1.778 m)   Wt (!) 325 lb (147.4 kg)   SpO2 98%   BMI 46.63 kg/m        Wt Readings from Last 3 Encounters:  10/18/23 (!) 325 lb (147.4 kg)  09/30/23 (!) 328 lb (148.8 kg)  07/05/23 (!) 334 lb (151.5 kg)    GEN: Well nourished, well developed in no acute distress NECK: No JVD; No carotid bruits CARDIAC: RRR, no murmurs, rubs, gallops  RESPIRATORY:  Clear to auscultation without rales, wheezing or rhonchi  ABDOMEN: Soft, non-tender, non-distended EXTREMITIES:  No edema; No deformity   ASSESSMENT AND PLAN Hypertension-his blood pressure is very elevated in the office today however he is asymptomatic, denies headache, blurred vision, weakness, dizziness.  His blood pressure appears to be well-controlled reviewing his blood pressure graph in epic, this may be an isolated spike.  I am going to arrange for him to pick up as needed dose of clonidine  today to take.  He did just take his losartan  approximately 30 minutes ago so this is likely not fully in his system at this time.  I will have him start checking his blood pressure at home again and provide him with a  blood pressure log for 2 weeks.  If his blood pressure remains elevated, could likely stop his losartan  and start him on telmisartan/HCTZ.  Cardiomyopathy-NYHA class I, euvolemic, for now continue losartan  100 mg daily, Lasix  40 mg daily.  Obesity-BMI is 46, he is already lost approximately 100 pounds through caloric restriction, recently been started on tirzapetide by his PCP . Heart healthy diet and regular cardiovascular exercise encouraged.    Dyslipidemia-currently on Lipitor 40 mg daily, this monitored by his PCP.       Dispo: Start clonidine  0.1 mg as needed for systolic blood pressure greater than 180; keep a blood pressure log for 2 weeks; follow-up in 1 year.  Signed, Delon JAYSON Hoover, NP

## 2023-10-18 ENCOUNTER — Encounter: Payer: Self-pay | Admitting: Cardiology

## 2023-10-18 ENCOUNTER — Ambulatory Visit: Attending: Cardiology | Admitting: Cardiology

## 2023-10-18 ENCOUNTER — Other Ambulatory Visit: Payer: Self-pay

## 2023-10-18 VITALS — BP 180/90 | HR 62 | Ht 70.0 in | Wt 325.0 lb

## 2023-10-18 DIAGNOSIS — I5032 Chronic diastolic (congestive) heart failure: Secondary | ICD-10-CM | POA: Diagnosis not present

## 2023-10-18 DIAGNOSIS — Z6841 Body Mass Index (BMI) 40.0 and over, adult: Secondary | ICD-10-CM

## 2023-10-18 DIAGNOSIS — E782 Mixed hyperlipidemia: Secondary | ICD-10-CM

## 2023-10-18 DIAGNOSIS — I10 Essential (primary) hypertension: Secondary | ICD-10-CM | POA: Diagnosis not present

## 2023-10-18 MED ORDER — CLONIDINE HCL 0.1 MG PO TABS: 0.1000 mg | ORAL_TABLET | ORAL | 3 refills | Status: DC | PRN

## 2023-10-18 NOTE — Patient Instructions (Addendum)
 Medication Instructions:  Your physician has recommended you make the following change in your medication:   START: Clonidine  0.1 mg as needed for systolic blood pressure greater than 180.  *If you need a refill on your cardiac medications before your next appointment, please call your pharmacy*  Lab Work: None If you have labs (blood work) drawn today and your tests are completely normal, you will receive your results only by: MyChart Message (if you have MyChart) OR A paper copy in the mail If you have any lab test that is abnormal or we need to change your treatment, we will call you to review the results.  Testing/Procedures: None  Follow-Up: At [facility], you and your health needs are our priority.  As part of our continuing mission to provide you with exceptional heart care, our providers are all part of one team.  This team includes your primary Cardiologist (physician) and Advanced Practice Providers or APPs (Physician Assistants and Nurse Practitioners) who all work together to provide you with the care you need, when you need it.  Your next appointment:   1 year(s)  Provider:   Lamar Fitch, MD    We recommend signing up for the patient portal called MyChart.  Sign up information is provided on this After Visit Summary.  MyChart is used to connect with patients for Virtual Visits (Telemedicine).  Patients are able to view lab/test results, encounter notes, upcoming appointments, etc.  Non-urgent messages can be sent to your provider as well.   To learn more about what you can do with MyChart, go to [email or web address].   Other Instructions Please keep a BP log for 2 weeks and send by MyChart or mail.                          Name and DOB__________________________ [name], NP [address] [place], [place] [address]  Blood Pressure Record Sheet To take your blood pressure, you will need a blood pressure machine. You can buy a blood pressure  machine (blood pressure monitor) at your clinic, drug store, or online. When choosing one, consider: An automatic monitor that has an arm cuff. A cuff that wraps snugly around your upper arm. You should be able to fit only one finger between your arm and the cuff. A device that stores blood pressure reading results. Do not choose a monitor that measures your blood pressure from your wrist or finger. Follow your health care provider's instructions for how to take your blood pressure. To use this form: Get one reading in the morning (a.m.) 1-2 hours after you take any medicines. Get one reading in the evening (p.m.) before supper.   Blood pressure log Date: _______________________  a.m. _____________________(1st reading) HR___________            p.m. _____________________(2nd reading) HR__________  Date: _______________________  a.m. _____________________(1st reading) HR___________            p.m. _____________________(2nd reading) HR__________  Date: _______________________  a.m. _____________________(1st reading) HR___________            p.m. _____________________(2nd reading) HR__________  Date: _______________________  a.m. _____________________(1st reading) HR___________            p.m. _____________________(2nd reading) HR__________  Date: _______________________  a.m. _____________________(1st reading) HR___________            p.m. _____________________(2nd reading) HR__________  Date: _______________________  a.m. _____________________(1st reading) HR___________  p.m. _____________________(2nd reading) HR__________  Date: _______________________  a.m. _____________________(1st reading) HR___________            p.m. _____________________(2nd reading) HR__________   This information is not intended to replace advice given to you by your health care provider. Make sure you discuss any questions you have with your health care provider. Document  Revised: 07/15/2019 Document Reviewed: 07/15/2019 Elsevier Patient Education  2021 [employer].

## 2023-10-25 ENCOUNTER — Telehealth: Payer: Self-pay

## 2023-10-25 DIAGNOSIS — I10 Essential (primary) hypertension: Secondary | ICD-10-CM

## 2023-10-25 MED ORDER — AMLODIPINE BESYLATE 5 MG PO TABS: 5.0000 mg | ORAL_TABLET | Freq: Every day | ORAL | 0 refills | Status: DC

## 2023-10-25 NOTE — Telephone Encounter (Signed)
 Amlodipine  5mg  1 tablet daily #90 ref x0 [facility]

## 2023-10-25 NOTE — Telephone Encounter (Signed)
 BMP before starting BP med, per [name], NP

## 2023-11-06 ENCOUNTER — Other Ambulatory Visit: Payer: Self-pay

## 2023-11-07 NOTE — Progress Notes (Deleted)
 Patient ID: Darren Carlson, male  DOB: [birth year], 48 y.o.   MRN: [id number] Patient Care Team    Relationship Specialty Notifications Start End  Catherine Charlies LABOR, DO PCP - General Family Medicine  01/23/17   Bernie Lamar PARAS, MD PCP - Cardiology Cardiology  10/17/23   Lucas Dorise POUR, MD Consulting Physician Cardiothoracic Surgery  05/03/17   Bernie Lamar PARAS, MD Consulting Physician Cardiology  05/03/17   Leonce Katz, DO Consulting Physician Sports Medicine  12/21/22   Carlin Delon BROCKS, NP Nurse Practitioner Cardiology  10/17/23     No chief complaint on file.   Subjective:Darren Carlson is a 48 y.o. male present for elevated blood pressure.  He was recently evaluated by cardiology who gave him clonidine  to take if blood pressure remains elevated.***  There is no height or weight on file to calculate BMI. LBS: 328> 316> 331> 334> 328 BMI: 47.09> 45.43> 47> 47> 47 Tolerating mounjaro  taper, now on 10 mg weekly.  Not noticing any negative SE. Reports had some weight loss, but recent vacation he put weight back on.  Changing diet, exercising more again.   hypertension/hyperlipidemia/acute combined systolic and diastolic heart failure/cardiomyopathy Reports compliance with losartan  100 mg daily, Lasix  40 mg daily and Lipitor.  Patient denies chest pain, shortness of breath, dizziness or lower extremity edema.  Coreg  discontinued and metoprolol  XL discontinued (by pt) secondary to side effects. He has established with cardiology and CTS. patient does not have a thoracic aortic aneurysm.  Repeat imaging ruled out aneurysm- artifact caused misdiagnosis.  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 EXAM: CT ANGIOGRAPHY CHEST WITH CONTRAST CONTRAST:  100mL ISOVUE -370 IOPAMIDOL  (ISOVUE -370)  INJECTION 76% COMPARISON:  03/22/2017 IMPRESSION: Previous aortic dimensions are felt to be overestimated due to motion artifact. The ascending thoracic aorta actually measures 4 cm in greatest diameter, which is at the upper limits of normal.   CT anterior chest 03/12/2017: IMPRESSION: 1. Ascending thoracic aorta measures 4.7 x 4.4 cm.   2.  No demonstrable pulmonary embolus. 3. There is prominence of the main pulmonary outflow tract with a measured transverse diameter of 3.9 cm. This finding is concerning for underlying pulmonary arterial hypertension. 4.  No lung edema or consolidation. 5.  No evident thoracic adenopathy.  Echocardiogram 04/26/2017: LV EF: 55-65 % Study Conclusions - Left ventricle: The cavity size was normal. There was moderate   concentric hypertrophy. Systolic function was normal. The   estimated ejection fraction was in the range of 55% to 65%. Wall   motion was normal; there were no regional wall motion   abnormalities. The study is not technically sufficient to allow   evaluation of LV diastolic function. - Aortic valve: Valve area (VTI): 3.44 cm^2. Valve area (Vmax):   3.05 cm^2. Valve area (Vmean): 2.95 cm^2. Impressions: - Normal LVEF.   Moderate LVH.   Trace MR.      07/05/2023   11:21 AM 07/18/2022    9:31 AM 02/02/2021   10:27 AM 09/27/2020    9:40 AM 12/23/2019   10:52 AM  Depression screen PHQ 2/9  Decreased Interest 0 0 0 0 0  Down, Depressed, Hopeless 0 0 0 0 0  PHQ - 2 Score 0 0 0 0 0    Past Medical History:  Diagnosis Date   Asthma    Cardiomyopathy (HCC) 01/30/2017  Normalization of left ventricular ejection fraction in February 2019     Chronic diastolic heart failure (HCC) 01/29/2017   Environmental allergies    Family history of colon cancer 01/23/2017   GERD (gastroesophageal reflux disease)    Hemorrhoids    Hyperlipidemia LDL goal <100 06/25/2018   Hypertension 01/23/2017   Morbid obesity with BMI of 45.0-49.9,  adult (HCC) 01/23/2017   Obesity    Allergies  Allergen Reactions   Amoxicillin Rash   Penicillins Rash   Past Surgical History:  Procedure Laterality Date   WISDOM TOOTH EXTRACTION     Family History  Problem Relation Age of Onset   Colon cancer Maternal Grandmother 24       early death   Diabetes Paternal Grandmother    Kidney disease Paternal Grandmother    Diabetes Paternal Grandfather    Kidney disease Paternal Grandfather    Colon cancer Maternal Aunt 35   Colon cancer Cousin    Stomach cancer Neg Hx    Pancreatic cancer Neg Hx    Esophageal cancer Neg Hx    Social History   Socioeconomic History   Marital status: Married    Spouse name: Davie   Number of children: 2   Years of education: 16   Highest education level: Bachelor's degree (e.g., BA, AB, BS)  Occupational History   Occupation: Sports administrator  Tobacco Use   Smoking status: Never   Smokeless tobacco: Never  Vaping Use   Vaping status: Never Used  Substance and Sexual Activity   Alcohol use: Yes    Comment: occasional   Drug use: No   Sexual activity: Yes    Partners: Female    Birth control/protection: None  Other Topics Concern   Not on file  Social History Narrative   Married to [place], 2 children.    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 Drinks caffeine.    Smoke alarm in the home. Wears seatbelt.    Feels safe in his relationships.    Social Drivers of Corporate investment banker Strain: Low Risk  (07/17/2022)   Overall Financial Resource Strain (CARDIA)    Difficulty of Paying Living Expenses: Not very hard  Food Insecurity: No Food Insecurity (07/17/2022)   Hunger Vital Sign    Worried About Running Out of Food in the Last Year: Never true    Ran Out of Food in the Last Year: Never true  Transportation Needs: No Transportation Needs (07/17/2022)   PRAPARE - Administrator, Civil Service (Medical): No    Lack of Transportation  (Non-Medical): No  Physical Activity: Sufficiently Active (07/17/2022)   Exercise Vital Sign    Days of Exercise per Week: 7 days    Minutes of Exercise per Session: 60 min  Stress: No Stress Concern Present (07/17/2022)   [employer] of Occupational Health - Occupational Stress Questionnaire    Feeling of Stress : Not at all  Social Connections: Socially Integrated (07/17/2022)   Social Connection and Isolation Panel    Frequency of Communication with Friends and Family: More than three times a week    Frequency of Social Gatherings with Friends and Family: Once a week    Attends Religious Services: More than 4 times per year    Active Member of [employer] or Organizations: Yes    Attends Engineer, structural: More than 4 times per year    Marital Status: Married  Catering manager Violence: Unknown (  07/11/2021)   Received from [facility]   HITS    Physically Hurt: Not on file    Insult or Talk Down To: Not on file    Threaten Physical Harm: Not on file    Scream or Curse: Not on file   Allergies as of 11/08/2023       Reactions   Amoxicillin Rash   Penicillins Rash        Medication List        Accurate as of November 07, 2023 12:53 PM. If you have any questions, ask your nurse or doctor.          amLODipine  5 MG tablet Commonly known as: NORVASC  Take 1 tablet (5 mg total) by mouth daily.   atorvastatin  40 MG tablet Commonly known as: LIPITOR Take 1 tablet (40 mg total) by mouth daily.   cloNIDine  0.1 MG tablet Commonly known as: CATAPRES  Take 1 tablet (0.1 mg total) by mouth as needed (for systolic blood pressure greater than 180.).   furosemide  20 MG tablet Commonly known as: LASIX  Take 2 tablets (40 mg total) by mouth daily.   losartan  100 MG tablet Commonly known as: COZAAR  Take 1 tablet (100 mg total) by mouth daily.   Zepbound  15 MG/0.5ML Pen Generic drug: tirzepatide  Inject 15 mg into the skin once a week.   Zepbound  15 MG/0.5ML Pen Generic  drug: tirzepatide  Inject 15 mg into the skin once a week.        Patient was never admitted.   ROS: 14 pt review of systems performed and negative (unless mentioned in an HPI)  Objective: There were no vitals taken for this visit.  Physical Exam Vitals and nursing note reviewed.  Constitutional:      General: He is not in acute distress.    Appearance: Normal appearance. He is obese. He is not ill-appearing, toxic-appearing or diaphoretic.  HENT:     Head: Normocephalic and atraumatic.  Eyes:     General: No scleral icterus.       Right eye: No discharge.        Left eye: No discharge.     Extraocular Movements: Extraocular movements intact.     Pupils: Pupils are equal, round, and reactive to light.  Cardiovascular:     Rate and Rhythm: Normal rate and regular rhythm.     Heart sounds: No murmur heard. Pulmonary:     Effort: Pulmonary effort is normal.  Musculoskeletal:     Right lower leg: No edema.     Left lower leg: No edema.  Skin:    General: Skin is warm and dry.     Coloration: Skin is not jaundiced or pale.     Findings: No rash.  Neurological:     Mental Status: He is alert and oriented to person, place, and time. Mental status is at baseline.  Psychiatric:        Mood and Affect: Mood normal.        Behavior: Behavior normal.        Thought Content: Thought content normal.        Judgment: Judgment normal.    No results found for this or any previous visit (from the past 24 hours).   Assessment/plan: Darren Carlson is a 48 y.o. male present for  morbid obesity/hypertension/diastolic heart failure (HCC)/hyperlipidemia goal less than 70 LDL Continue losartan  100 mg daily (extra benefit w/ gout) Continue Lasix  40 Continue daily baby aspirin. Continue atorvastatin  40 mg daily (he  has refused 80 mg ) - Ejection fraction improved, return to normal. LVH, trace MR. - NEGATIVE Thoracic aortic aneurysm --> repeat imaging felt he did not have an  aneurysm and  original was artifact.  - prior med: amlodipine , aldactone , coreg  (SE), metoprolol  (SE). - DIScontinue Aldactone  50 mg daily.   - Continue folllw-up per routine schedule with cardiology  Labs: UTD 05/2023 Continue  Zepbound  15 mg weekly injection.    No follow-ups on file.   No orders of the defined types were placed in this encounter.  No orders of the defined types were placed in this encounter.   Note is dictated utilizing voice recognition software. Although note has been proof read prior to signing, occasional typographical errors still can be missed. If any questions arise, please do not hesitate to call for verification.   Electronically signed by: Charlies Bellini, DO [facility] [place]

## 2023-11-08 ENCOUNTER — Ambulatory Visit: Admitting: Family Medicine

## 2023-11-09 ENCOUNTER — Encounter: Payer: Self-pay | Admitting: Cardiology

## 2023-11-13 ENCOUNTER — Encounter: Payer: Self-pay | Admitting: Family Medicine

## 2023-11-21 ENCOUNTER — Encounter: Payer: Self-pay | Admitting: Family Medicine

## 2023-11-21 ENCOUNTER — Ambulatory Visit: Admitting: Family Medicine

## 2023-11-21 VITALS — BP 140/88 | HR 73 | Temp 98.2°F | Wt 330.8 lb

## 2023-11-21 DIAGNOSIS — I5032 Chronic diastolic (congestive) heart failure: Secondary | ICD-10-CM | POA: Diagnosis not present

## 2023-11-21 DIAGNOSIS — E785 Hyperlipidemia, unspecified: Secondary | ICD-10-CM | POA: Diagnosis not present

## 2023-11-21 DIAGNOSIS — I10 Essential (primary) hypertension: Secondary | ICD-10-CM

## 2023-11-21 LAB — CBC
HCT: 47.2 % (ref 39.0–52.0)
Hemoglobin: 16 g/dL (ref 13.0–17.0)
MCHC: 33.9 g/dL (ref 30.0–36.0)
MCV: 83.8 fl (ref 78.0–100.0)
Platelets: 169 K/uL (ref 150.0–400.0)
RBC: 5.63 Mil/uL (ref 4.22–5.81)
RDW: 13.6 % (ref 11.5–15.5)
WBC: 7.2 K/uL (ref 4.0–10.5)

## 2023-11-21 LAB — BASIC METABOLIC PANEL WITH GFR
BUN: 14 mg/dL (ref 6–23)
CO2: 32 meq/L (ref 19–32)
Calcium: 9.1 mg/dL (ref 8.4–10.5)
Chloride: 100 meq/L (ref 96–112)
Creatinine, Ser: 1.01 mg/dL (ref 0.40–1.50)
GFR: 88.89 mL/min (ref >60.00)
Glucose, Bld: 116 mg/dL — ABNORMAL HIGH (ref 70–99)
Potassium: 4.1 meq/L (ref 3.5–5.1)
Sodium: 139 meq/L (ref 135–145)

## 2023-11-21 LAB — TSH: TSH: 1.69 u[IU]/mL (ref 0.35–5.50)

## 2023-11-21 MED ORDER — AMLODIPINE BESYLATE 5 MG PO TABS
5.0000 mg | ORAL_TABLET | Freq: Two times a day (BID) | ORAL | 1 refills | Status: DC
Start: 2023-11-21 — End: 2024-02-25

## 2023-11-21 MED ORDER — FUROSEMIDE 20 MG PO TABS: 40.0000 mg | ORAL_TABLET | Freq: Every day | ORAL | 1 refills | Status: DC

## 2023-11-21 MED ORDER — VALSARTAN 320 MG PO TABS: 320.0000 mg | ORAL_TABLET | Freq: Every day | ORAL | 3 refills | Status: DC

## 2023-11-21 MED ORDER — ATORVASTATIN CALCIUM 40 MG PO TABS: 40.0000 mg | ORAL_TABLET | Freq: Every day | ORAL | 3 refills | Status: DC

## 2023-11-21 NOTE — Progress Notes (Signed)
 Patient ID: Darren Carlson, male  DOB: 1975/09/14, 48 y.o.   MRN: [id number] Patient Care Team    Relationship Specialty Notifications Start End  Catherine Charlies LABOR, DO PCP - General Family Medicine  01/23/17   Bernie Lamar PARAS, MD PCP - Cardiology Cardiology  10/17/23   Lucas Dorise POUR, MD Consulting Physician Cardiothoracic Surgery  05/03/17   Bernie Lamar PARAS, MD Consulting Physician Cardiology  05/03/17   Leonce Katz, DO Consulting Physician Sports Medicine  12/21/22   Carlin Delon BROCKS, NP Nurse Practitioner Cardiology  10/17/23     Chief Complaint  Patient presents with   Hypertension    Subjective:Darren Carlson is a 48 y.o. male present for chronic condition management  Body mass index is 47.46 kg/m. LBS: 328> 316> 331> 334> 328>330 BMI: 47.09> 45.43> 47> 47> 47>47 Tolerating mounjaro  taper, now on 10 mg weekly.  Not noticing any negative SE. Reports had some weight loss, but recent vacation he put weight back on.  Changing diet, exercising more again.   hypertension/hyperlipidemia/acute combined systolic and diastolic heart failure/cardiomyopathy Reports compliance with losartan  100 mg daily, amlodipine  5 mg (takes at night), Lasix  40 mg daily and Lipitor.  Patient denies chest pain, shortness of breath, dizziness or lower extremity edema.   Patient reports his blood pressures have been elevated at his cardiology appointment a few weeks ago and at home he has been noticing elevated blood pressures.  He was restarted on amlodipine  at his cardiology office.  He reports he does not feel bad.  He is under stress but he is under stress routinely and does not feel any added stress currently.  Coreg  discontinued and metoprolol  XL discontinued (by pt) secondary to side effects. He has established with cardiology and CTS. patient does not have a thoracic aortic aneurysm.  Repeat imaging ruled out aneurysm- artifact caused misdiagnosis.  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 EXAM: CT ANGIOGRAPHY CHEST WITH CONTRAST CONTRAST:  100mL ISOVUE -370 IOPAMIDOL  (ISOVUE -370) INJECTION 76% COMPARISON:  03/22/2017 IMPRESSION: Previous aortic dimensions are felt to be overestimated due to motion artifact. The ascending thoracic aorta actually measures 4 cm in greatest diameter, which is at the upper limits of normal.   CT anterior chest 03/12/2017: IMPRESSION: 1. Ascending thoracic aorta measures 4.7 x 4.4 cm.   2.  No demonstrable pulmonary embolus. 3. There is prominence of the main pulmonary outflow tract with a measured transverse diameter of 3.9 cm. This finding is concerning for underlying pulmonary arterial hypertension. 4.  No lung edema or consolidation. 5.  No evident thoracic adenopathy.  Echocardiogram 04/26/2017: LV EF: 55-65 % Study Conclusions - Left ventricle: The cavity size was normal. There was moderate   concentric hypertrophy. Systolic function was normal. The   estimated ejection fraction was in the range of 55% to 65%. Wall   motion was normal; there were no regional wall motion   abnormalities. The study is not technically sufficient to allow   evaluation of LV diastolic function. - Aortic valve: Valve area (VTI): 3.44 cm^2. Valve area (Vmax):   3.05 cm^2. Valve area (Vmean): 2.95 cm^2. Impressions: - Normal LVEF.   Moderate LVH.   Trace MR.      07/05/2023   11:21 AM 07/18/2022    9:31 AM 02/02/2021   10:27 AM 09/27/2020    9:40 AM 12/23/2019   10:52  AM  Depression screen PHQ 2/9  Decreased Interest 0 0 0 0 0  Down, Depressed, Hopeless 0 0 0 0 0  PHQ - 2 Score 0 0 0 0 0    Past Medical History:  Diagnosis Date   Asthma    Cardiomyopathy (HCC) 01/30/2017   Normalization of left ventricular ejection fraction in February 2019      Chronic diastolic heart failure (HCC) 01/29/2017   Environmental allergies    Family history of colon cancer 01/23/2017   GERD (gastroesophageal reflux disease)    Hemorrhoids    Hyperlipidemia LDL goal <100 06/25/2018   Hypertension 01/23/2017   Morbid obesity with BMI of 45.0-49.9, adult (HCC) 01/23/2017   Obesity    Allergies  Allergen Reactions   Amoxicillin Rash   Penicillins Rash   Past Surgical History:  Procedure Laterality Date   WISDOM TOOTH EXTRACTION     Family History  Problem Relation Age of Onset   Colon cancer Maternal Grandmother 74       early death   Diabetes Paternal Grandmother    Kidney disease Paternal Grandmother    Diabetes Paternal Grandfather    Kidney disease Paternal Grandfather    Colon cancer Maternal Aunt 35   Colon cancer Cousin    Stomach cancer Neg Hx    Pancreatic cancer Neg Hx    Esophageal cancer Neg Hx    Social History   Socioeconomic History   Marital status: Married    Spouse name: Darren Carlson   Number of children: 2   Years of education: 16   Highest education level: Bachelor's degree (e.g., BA, AB, BS)  Occupational History   Occupation: Sports administrator  Tobacco Use   Smoking status: Never   Smokeless tobacco: Never  Vaping Use   Vaping status: Never Used  Substance and Sexual Activity   Alcohol use: Yes    Comment: occasional   Drug use: No   Sexual activity: Yes    Partners: Female    Birth control/protection: None  Other Topics Concern   Not on file  Social History Narrative   Married to Darren Carlson, 2 children.    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 Drinks caffeine.    Smoke alarm in the home. Wears seatbelt.    Feels safe in his relationships.    Social Drivers of Corporate investment banker Strain: Low Risk  (07/17/2022)   Overall Financial Resource Strain (CARDIA)    Difficulty of Paying Living Expenses: Not very hard  Food Insecurity: No Food Insecurity (07/17/2022)    Hunger Vital Sign    Worried About Running Out of Food in the Last Year: Never true    Ran Out of Food in the Last Year: Never true  Transportation Needs: No Transportation Needs (07/17/2022)   PRAPARE - Administrator, Civil Service (Medical): No    Lack of Transportation (Non-Medical): No  Physical Activity: Sufficiently Active (07/17/2022)   Exercise Vital Sign    Days of Exercise per Week: 7 days    Minutes of Exercise per Session: 60 min  Stress: No Stress Concern Present (07/17/2022)   [employer] of Occupational Health - Occupational Stress Questionnaire    Feeling of Stress : Not at all  Social Connections: Socially Integrated (07/17/2022)   Social Connection and Isolation Panel    Frequency of Communication with Friends and Family: More than three times a week    Frequency of Social Gatherings with  Friends and Family: Once a week    Attends Religious Services: More than 4 times per year    Active Member of [employer] or Organizations: Yes    Attends Banker Meetings: More than 4 times per year    Marital Status: Married  Catering manager Violence: Unknown (07/11/2021)   Received from [facility]   HITS    Physically Hurt: Not on file    Insult or Talk Down To: Not on file    Threaten Physical Harm: Not on file    Scream or Curse: Not on file   Allergies as of 11/21/2023       Reactions   Amoxicillin Rash   Penicillins Rash        Medication List        Accurate as of November 21, 2023  1:29 PM. If you have any questions, ask your nurse or doctor.          STOP taking these medications    cloNIDine  0.1 MG tablet Commonly known as: CATAPRES  Stopped by: Charlies Bellini   losartan  100 MG tablet Commonly known as: COZAAR  Stopped by: Charlies Bellini       TAKE these medications    amLODipine  5 MG tablet Commonly known as: NORVASC  Take 1 tablet (5 mg total) by mouth in the morning and at bedtime. What changed: when to take this Changed  by: Charlies Bellini   atorvastatin  40 MG tablet Commonly known as: LIPITOR Take 1 tablet (40 mg total) by mouth daily.   furosemide  20 MG tablet Commonly known as: LASIX  Take 2 tablets (40 mg total) by mouth daily.   valsartan  320 MG tablet Commonly known as: DIOVAN  Take 1 tablet (320 mg total) by mouth daily. Started by: Trevia Nop   Zepbound  15 MG/0.5ML Pen Generic drug: tirzepatide  Inject 15 mg into the skin once a week.   Zepbound  15 MG/0.5ML Pen Generic drug: tirzepatide  Inject 15 mg into the skin once a week.        Patient was never admitted.   ROS: 14 pt review of systems performed and negative (unless mentioned in an HPI)  Objective: BP (!) 140/88   Pulse 73   Temp 98.2 F (36.8 C)   Wt (!) 330 lb 12.8 oz (150 kg)   SpO2 98%   BMI 47.46 kg/m   Physical Exam Vitals and nursing note reviewed.  Constitutional:      General: He is not in acute distress.    Appearance: Normal appearance. He is obese. He is not ill-appearing, toxic-appearing or diaphoretic.  HENT:     Head: Normocephalic and atraumatic.  Eyes:     General: No scleral icterus.       Right eye: No discharge.        Left eye: No discharge.     Extraocular Movements: Extraocular movements intact.     Pupils: Pupils are equal, round, and reactive to light.  Cardiovascular:     Rate and Rhythm: Normal rate and regular rhythm.     Heart sounds: No murmur heard. Pulmonary:     Effort: Pulmonary effort is normal. No respiratory distress.     Breath sounds: Normal breath sounds. No wheezing, rhonchi or rales.  Musculoskeletal:     Cervical back: Neck supple.     Right lower leg: No edema.     Left lower leg: No edema.  Skin:    General: Skin is warm and dry.     Coloration: Skin is  not jaundiced or pale.     Findings: No rash.  Neurological:     Mental Status: He is alert and oriented to person, place, and time. Mental status is at baseline.  Psychiatric:        Mood and Affect: Mood  normal.        Behavior: Behavior normal.        Thought Content: Thought content normal.        Judgment: Judgment normal.    No results found for this or any previous visit (from the past 24 hours).   Assessment/plan: Saman Umstead is a 48 y.o. male present for  morbid obesity/hypertension/diastolic heart failure (HCC)/hyperlipidemia goal less than 70 LDL Switch from losartan  100 to valsartan  320 mg daily Continue Lasix  40 Increase amlodipine  to 5 mg twice daily Continue daily baby aspirin. Continue atorvastatin  40 mg daily (he has refused 80 mg ) - Ejection fraction improved, return to normal. LVH, trace MR. - NEGATIVE Thoracic aortic aneurysm --> repeat imaging felt he did not have an  aneurysm and original was artifact.  - prior med: amlodipine , aldactone , coreg  (SE), metoprolol  (SE). - DIScontinue Aldactone  50 mg daily.   - Continue folllw-up per routine schedule with cardiology  Labs: UTD 05/2023 Continue  Zepbound  15 mg weekly injection.   CBC, CMP and TSH collected today Follow-up in 4 weeks  Return in about 4 weeks (around 12/19/2023) for htn follow up.   Orders Placed This Encounter  Procedures   CBC   TSH   Basic Metabolic Panel (BMET)   Meds ordered this encounter  Medications   amLODipine  (NORVASC ) 5 MG tablet    Sig: Take 1 tablet (5 mg total) by mouth in the morning and at bedtime.    Dispense:  180 tablet    Refill:  1   valsartan  (DIOVAN ) 320 MG tablet    Sig: Take 1 tablet (320 mg total) by mouth daily.    Dispense:  90 tablet    Refill:  3   furosemide  (LASIX ) 20 MG tablet    Sig: Take 2 tablets (40 mg total) by mouth daily.    Dispense:  180 tablet    Refill:  1   atorvastatin  (LIPITOR) 40 MG tablet    Sig: Take 1 tablet (40 mg total) by mouth daily.    Dispense:  90 tablet    Refill:  3    Note is dictated utilizing voice recognition software. Although note has been proof read prior to signing, occasional typographical errors still can be  missed. If any questions arise, please do not hesitate to call for verification.   Electronically signed by: Charlies Bellini, DO [facility] [place]

## 2023-11-21 NOTE — Patient Instructions (Signed)
 Return in about 4 weeks (around 12/19/2023) for htn follow up.        Great to see you today.  I have refilled the medication(s) we provide.   If labs were collected or images ordered, we will inform you of  results once we have received them and reviewed. We will contact you either by echart message, or telephone call.  Please give ample time to the testing facility, and our office to run,  receive and review results. Please do not call inquiring of results, even if you can see them in your chart. We will contact you as soon as we are able. If it has been over 1 week since the test was completed, and you have not yet heard from us , then please call us .    - echart message- for normal results that have been seen by the patient already.   - telephone call: abnormal results or if patient has not viewed results in their echart.  If a referral to a specialist was entered for you, please call us  in 2 weeks if you have not heard from the specialist office to schedule.

## 2023-11-22 ENCOUNTER — Ambulatory Visit: Payer: Self-pay | Admitting: Family Medicine

## 2023-12-16 ENCOUNTER — Other Ambulatory Visit (HOSPITAL_BASED_OUTPATIENT_CLINIC_OR_DEPARTMENT_OTHER): Payer: Self-pay

## 2023-12-30 ENCOUNTER — Ambulatory Visit: Admitting: Family Medicine

## 2023-12-30 ENCOUNTER — Encounter: Payer: Self-pay | Admitting: Family Medicine

## 2023-12-30 VITALS — BP 134/80 | HR 74 | Temp 98.3°F | Wt 343.6 lb

## 2023-12-30 DIAGNOSIS — I420 Dilated cardiomyopathy: Secondary | ICD-10-CM

## 2023-12-30 DIAGNOSIS — I10 Essential (primary) hypertension: Secondary | ICD-10-CM

## 2023-12-30 DIAGNOSIS — Z1211 Encounter for screening for malignant neoplasm of colon: Secondary | ICD-10-CM

## 2023-12-30 DIAGNOSIS — E785 Hyperlipidemia, unspecified: Secondary | ICD-10-CM

## 2023-12-30 DIAGNOSIS — Z6841 Body Mass Index (BMI) 40.0 and over, adult: Secondary | ICD-10-CM

## 2023-12-30 DIAGNOSIS — I5032 Chronic diastolic (congestive) heart failure: Secondary | ICD-10-CM | POA: Diagnosis not present

## 2023-12-30 MED ORDER — SPIRONOLACTONE 25 MG PO TABS: 25.0000 mg | ORAL_TABLET | Freq: Every day | ORAL | 1 refills | Status: DC

## 2023-12-30 NOTE — Progress Notes (Signed)
 Patient ID: Darren Carlson, male  DOB: April 02, 1976, 48 y.o.   MRN: [id number] Patient Care Team    Relationship Specialty Notifications Start End  Catherine Charlies LABOR, DO PCP - General Family Medicine  01/23/17   Bernie Lamar PARAS, MD PCP - Cardiology Cardiology  10/17/23   Lucas Dorise POUR, MD Consulting Physician Cardiothoracic Surgery  05/03/17   Bernie Lamar PARAS, MD Consulting Physician Cardiology  05/03/17   Leonce Katz, DO Consulting Physician Sports Medicine  12/21/22   Carlin Delon BROCKS, NP Nurse Practitioner Cardiology  10/17/23     Chief Complaint  Patient presents with   Hypertension   Hyperlipidemia    Obesity    Subjective:Darren Carlson is a 48 y.o. male present for chronic condition management  Body mass index is 49.3 kg/m. LBS: 328> 316> 331> 334> 328>330>343 BMI: 47.09> 45.43> 47> 47> 47>47>49.3 Tolerating mounjaro15 mg weekly.  Not noticing any negative SE. Reports had some weight loss, but recent vacation he put weight back on.  Changing diet, exercising more again.  Not following low glycemic diet, but is restricting calories.   hypertension/hyperlipidemia/acute combined systolic and diastolic heart failure/cardiomyopathy Reports compliance with valsartan  320 mg daily (changed from losartan  100 last visit), amlodipine  5 mg changed to twice daily last appointment , Lasix  40 mg daily and Lipitor.  Patient denies chest pain, shortness of breath, dizziness or lower extremity edema.    Patient reports his blood pressures have been elevated at his cardiology appointment a few weeks ago and at home he has been noticing elevated blood pressures.  He was restarted on amlodipine  at his cardiology office.  He reports he does not feel bad.  He is under stress but he is under stress routinely and does not feel any added stress currently.  Coreg  discontinued and metoprolol  XL discontinued (by pt) secondary to side effects. He has established with cardiology and CTS. patient  does not have a thoracic aortic aneurysm.  Repeat imaging ruled out aneurysm- artifact caused misdiagnosis.  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 EXAM: CT ANGIOGRAPHY CHEST WITH CONTRAST CONTRAST:  100mL ISOVUE -370 IOPAMIDOL  (ISOVUE -370) INJECTION 76% COMPARISON:  03/22/2017 IMPRESSION: Previous aortic dimensions are felt to be overestimated due to motion artifact. The ascending thoracic aorta actually measures 4 cm in greatest diameter, which is at the upper limits of normal.   CT anterior chest 03/12/2017: IMPRESSION: 1. Ascending thoracic aorta measures 4.7 x 4.4 cm.   2.  No demonstrable pulmonary embolus. 3. There is prominence of the main pulmonary outflow tract with a measured transverse diameter of 3.9 cm. This finding is concerning for underlying pulmonary arterial hypertension. 4.  No lung edema or consolidation. 5.  No evident thoracic adenopathy.  Echocardiogram 04/26/2017: LV EF: 55-65 % Study Conclusions - Left ventricle: The cavity size was normal. There was moderate   concentric hypertrophy. Systolic function was normal. The   estimated ejection fraction was in the range of 55% to 65%. Wall   motion was normal; there were no regional wall motion   abnormalities. The study is not technically sufficient to allow   evaluation of LV diastolic function. - Aortic valve: Valve area (VTI): 3.44 cm^2. Valve area (Vmax):   3.05 cm^2. Valve area (Vmean): 2.95 cm^2. Impressions: - Normal LVEF.   Moderate LVH.   Trace MR.      07/05/2023  11:21 AM 07/18/2022    9:31 AM 02/02/2021   10:27 AM 09/27/2020    9:40 AM 12/23/2019   10:52 AM  Depression screen PHQ 2/9  Decreased Interest 0 0 0 0 0  Down, Depressed, Hopeless 0 0 0 0 0  PHQ - 2 Score 0 0 0 0 0    Past Medical  History:  Diagnosis Date   Asthma    Cardiomyopathy (HCC) 01/30/2017   Normalization of left ventricular ejection fraction in February 2019     Chronic diastolic heart failure (HCC) 01/29/2017   Environmental allergies    Family history of colon cancer 01/23/2017   GERD (gastroesophageal reflux disease)    Hemorrhoids    Hyperlipidemia LDL goal <100 06/25/2018   Hypertension 01/23/2017   Morbid obesity with BMI of 45.0-49.9, adult (HCC) 01/23/2017   Obesity    Allergies  Allergen Reactions   Amoxicillin Rash   Penicillins Rash   Past Surgical History:  Procedure Laterality Date   WISDOM TOOTH EXTRACTION     Family History  Problem Relation Age of Onset   Colon cancer Maternal Grandmother 66       early death   Diabetes Paternal Grandmother    Kidney disease Paternal Grandmother    Diabetes Paternal Grandfather    Kidney disease Paternal Grandfather    Colon cancer Maternal Aunt 35   Colon cancer Cousin    Stomach cancer Neg Hx    Pancreatic cancer Neg Hx    Esophageal cancer Neg Hx    Social History   Socioeconomic History   Marital status: Married    Spouse name: Davie   Number of children: 2   Years of education: 16   Highest education level: Bachelor's degree (e.g., BA, AB, BS)  Occupational History   Occupation: Sports administrator  Tobacco Use   Smoking status: Never   Smokeless tobacco: Never  Vaping Use   Vaping status: Never Used  Substance and Sexual Activity   Alcohol use: Yes    Comment: occasional   Drug use: No   Sexual activity: Yes    Partners: Female    Birth control/protection: None  Other Topics Concern   Not on file  Social History Narrative   Married to [place]ada, 2 children.    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 Drinks caffeine.    Smoke alarm in the home. Wears seatbelt.    Feels safe in his relationships.    Social Drivers of Corporate investment banker Strain: Low Risk  (07/17/2022)    Overall Financial Resource Strain (CARDIA)    Difficulty of Paying Living Expenses: Not very hard  Food Insecurity: No Food Insecurity (07/17/2022)   Hunger Vital Sign    Worried About Running Out of Food in the Last Year: Never true    Ran Out of Food in the Last Year: Never true  Transportation Needs: No Transportation Needs (07/17/2022)   PRAPARE - Administrator, Civil Service (Medical): No    Lack of Transportation (Non-Medical): No  Physical Activity: Sufficiently Active (07/17/2022)   Exercise Vital Sign    Days of Exercise per Week: 7 days    Minutes of Exercise per Session: 60 min  Stress: No Stress Concern Present (07/17/2022)   [employer] of Occupational Health - Occupational Stress Questionnaire    Feeling of Stress : Not at all  Social Connections: Socially Integrated (07/17/2022)   Social Connection and Isolation Panel  Frequency of Communication with Friends and Family: More than three times a week    Frequency of Social Gatherings with Friends and Family: Once a week    Attends Religious Services: More than 4 times per year    Active Member of [employer] or Organizations: Yes    Attends Banker Meetings: More than 4 times per year    Marital Status: Married  Catering manager Violence: Unknown (07/11/2021)   Received from [facility]   HITS    Physically Hurt: Not on file    Insult or Talk Down To: Not on file    Threaten Physical Harm: Not on file    Scream or Curse: Not on file   Allergies as of 12/30/2023       Reactions   Amoxicillin Rash   Penicillins Rash        Medication List        Accurate as of December 30, 2023  9:00 AM. If you have any questions, ask your nurse or doctor.          amLODipine  5 MG tablet Commonly known as: NORVASC  Take 1 tablet (5 mg total) by mouth in the morning and at bedtime.   atorvastatin  40 MG tablet Commonly known as: LIPITOR Take 1 tablet (40 mg total) by mouth daily.   furosemide  20 MG  tablet Commonly known as: LASIX  Take 2 tablets (40 mg total) by mouth daily.   spironolactone  25 MG tablet Commonly known as: ALDACTONE  Take 1 tablet (25 mg total) by mouth daily. Started by: Charlies Bellini   valsartan  320 MG tablet Commonly known as: DIOVAN  Take 1 tablet (320 mg total) by mouth daily.   Zepbound  15 MG/0.5ML Pen Generic drug: tirzepatide  Inject 15 mg into the skin once a week. What changed: Another medication with the same name was removed. Continue taking this medication, and follow the directions you see here. Changed by: Charlies Bellini        Patient was never admitted.   ROS: 14 pt review of systems performed and negative (unless mentioned in an HPI)  Objective: BP 134/80   Pulse 74   Temp 98.3 F (36.8 C)   Wt (!) 343 lb 9.6 oz (155.9 kg)   SpO2 98%   BMI 49.30 kg/m   Physical Exam Vitals and nursing note reviewed.  Constitutional:      General: He is not in acute distress.    Appearance: Normal appearance. He is obese. He is not ill-appearing, toxic-appearing or diaphoretic.  HENT:     Head: Normocephalic and atraumatic.  Eyes:     General: No scleral icterus.       Right eye: No discharge.        Left eye: No discharge.     Extraocular Movements: Extraocular movements intact.     Pupils: Pupils are equal, round, and reactive to light.  Cardiovascular:     Rate and Rhythm: Normal rate and regular rhythm.     Heart sounds: No murmur heard. Pulmonary:     Effort: Pulmonary effort is normal. No respiratory distress.     Breath sounds: Normal breath sounds. No wheezing, rhonchi or rales.  Musculoskeletal:     Cervical back: Neck supple.     Right lower leg: No edema.     Left lower leg: No edema.  Skin:    General: Skin is warm and dry.     Coloration: Skin is not jaundiced or pale.     Findings:  No rash.  Neurological:     Mental Status: He is alert and oriented to person, place, and time. Mental status is at baseline.  Psychiatric:         Mood and Affect: Mood normal.        Behavior: Behavior normal.        Thought Content: Thought content normal.        Judgment: Judgment normal.    No results found for this or any previous visit (from the past 24 hours).   Assessment/plan: [facility] is a 48 y.o. male present for  morbid obesity/hypertension/diastolic heart failure (HCC)/hyperlipidemia goal less than 70 LDL Stable Continue Valsartan  320 mg daily Continue Lasix  40 Continue Amlodipine  to 5 mg twice daily Continue daily baby aspirin. Continue atorvastatin  40 mg daily (he has refused 80 mg ) - Ejection fraction improved, return to normal. LVH, trace MR. - NEGATIVE Thoracic aortic aneurysm --> repeat imaging felt he did not have an  aneurysm and original was artifact.  - prior med: amlodipine , aldactone , coreg  (SE), metoprolol  (SE). - Continue folllw-up per routine schedule with cardiology  Labs: UTD 05/2023 Continue Zepbound  15 mg weekly injection.   Labs UTD Low glycemic diet (strict) recommended.  Avoid all alcohol and drink water only   Colon cancer screen: Cologuard ordered today  Return in about 24 weeks (around 06/15/2024).   Orders Placed This Encounter  Procedures   Cologuard   Meds ordered this encounter  Medications   spironolactone  (ALDACTONE ) 25 MG tablet    Sig: Take 1 tablet (25 mg total) by mouth daily.    Dispense:  90 tablet    Refill:  1    Note is dictated utilizing voice recognition software. Although note has been proof read prior to signing, occasional typographical errors still can be missed. If any questions arise, please do not hesitate to call for verification.   Electronically signed by: Charlies Bellini, DO [facility] [place]

## 2023-12-30 NOTE — Patient Instructions (Addendum)

## 2024-01-20 ENCOUNTER — Other Ambulatory Visit: Payer: Self-pay | Admitting: Cardiology

## 2024-01-21 ENCOUNTER — Other Ambulatory Visit (HOSPITAL_BASED_OUTPATIENT_CLINIC_OR_DEPARTMENT_OTHER): Payer: Self-pay

## 2024-02-25 ENCOUNTER — Ambulatory Visit: Admitting: Family Medicine

## 2024-02-25 ENCOUNTER — Encounter: Payer: Self-pay | Admitting: Family Medicine

## 2024-02-25 VITALS — BP 178/110 | HR 81 | Temp 98.0°F | Wt 351.2 lb

## 2024-02-25 DIAGNOSIS — I10 Essential (primary) hypertension: Secondary | ICD-10-CM

## 2024-02-25 DIAGNOSIS — I5032 Chronic diastolic (congestive) heart failure: Secondary | ICD-10-CM | POA: Diagnosis not present

## 2024-02-25 DIAGNOSIS — I420 Dilated cardiomyopathy: Secondary | ICD-10-CM | POA: Diagnosis not present

## 2024-02-25 DIAGNOSIS — E785 Hyperlipidemia, unspecified: Secondary | ICD-10-CM

## 2024-02-25 DIAGNOSIS — Z6841 Body Mass Index (BMI) 40.0 and over, adult: Secondary | ICD-10-CM

## 2024-02-25 MED ORDER — SPIRONOLACTONE 50 MG PO TABS: 50.0000 mg | ORAL_TABLET | Freq: Every day | ORAL | 1 refills | Status: DC

## 2024-02-25 MED ORDER — FUROSEMIDE 20 MG PO TABS: 20.0000 mg | ORAL_TABLET | Freq: Every day | ORAL | 1 refills | Status: DC

## 2024-02-25 MED ORDER — SPIRONOLACTONE 50 MG PO TABS: 50.0000 mg | ORAL_TABLET | Freq: Every day | ORAL | 1 refills | Status: AC

## 2024-02-25 MED ORDER — VALSARTAN 320 MG PO TABS: 320.0000 mg | ORAL_TABLET | Freq: Every day | ORAL | 1 refills | Status: AC

## 2024-02-25 MED ORDER — FUROSEMIDE 20 MG PO TABS: 40.0000 mg | ORAL_TABLET | Freq: Every day | ORAL | 1 refills | Status: DC

## 2024-02-25 MED ORDER — VALSARTAN 320 MG PO TABS: 320.0000 mg | ORAL_TABLET | Freq: Every day | ORAL | 3 refills | Status: DC

## 2024-02-25 MED ORDER — ATORVASTATIN CALCIUM 40 MG PO TABS: 40.0000 mg | ORAL_TABLET | Freq: Every day | ORAL | 3 refills | Status: AC

## 2024-02-25 NOTE — Patient Instructions (Addendum)
 Return in about 2 weeks (around 03/10/2024) for nurse visit- BP recheck.        Great to see you today.  I have refilled the medication(s) we provide.   If labs were collected or images ordered, we will inform you of  results once we have received them and reviewed. We will contact you either by echart message, or telephone call.  Please give ample time to the testing facility, and our office to run,  receive and review results. Please do not call inquiring of results, even if you can see them in your chart. We will contact you as soon as we are able. If it has been over 1 week since the test was completed, and you have not yet heard from us , then please call us .    - echart message- for normal results that have been seen by the patient already.   - telephone call: abnormal results or if patient has not viewed results in their echart.  If a referral to a specialist was entered for you, please call us  in 2 weeks if you have not heard from the specialist office to schedule.

## 2024-02-25 NOTE — Progress Notes (Signed)
 Patient ID: Mickel Schreur, male  DOB: Mar 31, 1976, 48 y.o.   MRN: [id number] Patient Care Team    Relationship Specialty Notifications Start End  Catherine Charlies LABOR, DO PCP - General Family Medicine  01/23/17   Bernie Lamar PARAS, MD PCP - Cardiology Cardiology  10/17/23   Lucas Dorise POUR, MD Consulting Physician Cardiothoracic Surgery  05/03/17   Bernie Lamar PARAS, MD Consulting Physician Cardiology  05/03/17   Leonce Katz, DO Consulting Physician Sports Medicine  12/21/22   Carlin Delon BROCKS, NP Nurse Practitioner Cardiology  10/17/23     Chief Complaint  Patient presents with   Medical Management of Chronic Issues   Hypertension   Obesity    Subjective:Benney Battaglini is a 48 y.o. male present for chronic condition management Medication reconciliation completed today. All past medical history updated where appropriate.  Body mass index is 50.39 kg/m. LBS: 328> 316> 331> 334> 328>330>343>351 BMI: 47.09> 45.43> 47> 47> 47>47>49.3>50.39 Tolerating mounjaro15 mg weekly.  Not noticing any negative SE.  Changing diet, exercising more again.  Not following low glycemic diet, but is restricting calories.    hypertension/hyperlipidemia/acute combined systolic and diastolic heart failure/cardiomyopathy Reports compliance with valsartan  320 mg daily, and Lasix  40 mg daily and Lipitor.  He is not taking the amlodipine  which was prescribed twice a day.  He states he does not like the way the amlodipine  makes him feel. Patient denies chest pain, shortness of breath, dizziness or lower extremity edema.   Amlodipine , Coreg  and metoprolol  XL discontinued (by pt) secondary to side effects.  He has established with cardiology and CTS. patient does not have a thoracic aortic aneurysm.  Repeat imaging ruled out aneurysm- artifact caused misdiagnosis.  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 EXAM: CT ANGIOGRAPHY CHEST WITH CONTRAST CONTRAST:  100mL ISOVUE -370 IOPAMIDOL  (ISOVUE -370) INJECTION 76% COMPARISON:  03/22/2017 IMPRESSION: Previous aortic dimensions are felt to be overestimated due to motion artifact. The ascending thoracic aorta actually measures 4 cm in greatest diameter, which is at the upper limits of normal.   CT anterior chest 03/12/2017: IMPRESSION: 1. Ascending thoracic aorta measures 4.7 x 4.4 cm.   2.  No demonstrable pulmonary embolus. 3. There is prominence of the main pulmonary outflow tract with a measured transverse diameter of 3.9 cm. This finding is concerning for underlying pulmonary arterial hypertension. 4.  No lung edema or consolidation. 5.  No evident thoracic adenopathy.  Echocardiogram 04/26/2017: LV EF: 55-65 % Study Conclusions - Left ventricle: The cavity size was normal. There was moderate   concentric hypertrophy. Systolic function was normal. The   estimated ejection fraction was in the range of 55% to 65%. Wall   motion was normal; there were no regional wall motion   abnormalities. The study is not technically sufficient to allow   evaluation of LV diastolic function. - Aortic valve: Valve area (VTI): 3.44 cm^2. Valve area (Vmax):   3.05 cm^2. Valve area (Vmean): 2.95 cm^2. Impressions: - Normal LVEF.   Moderate LVH.   Trace MR.      07/05/2023   11:21 AM 07/18/2022    9:31 AM 02/02/2021   10:27 AM 09/27/2020    9:40 AM 12/23/2019   10:52 AM  Depression screen PHQ 2/9  Decreased Interest 0 0 0 0 0  Down, Depressed, Hopeless 0 0 0 0 0  PHQ - 2  Score 0 0 0 0 0    Past Medical History:  Diagnosis Date   Asthma    Cardiomyopathy (HCC) 01/30/2017   Normalization of left ventricular ejection fraction in February 2019     Chronic diastolic heart failure (HCC) 01/29/2017   Environmental allergies    Family history of colon  cancer 01/23/2017   GERD (gastroesophageal reflux disease)    Hemorrhoids    Hyperlipidemia LDL goal <100 06/25/2018   Hypertension 01/23/2017   Morbid obesity with BMI of 45.0-49.9, adult (HCC) 01/23/2017   Obesity    Allergies  Allergen Reactions   Amoxicillin Rash   Penicillins Rash   Past Surgical History:  Procedure Laterality Date   WISDOM TOOTH EXTRACTION     Family History  Problem Relation Age of Onset   Colon cancer Maternal Grandmother 7       early death   Diabetes Paternal Grandmother    Kidney disease Paternal Grandmother    Diabetes Paternal Grandfather    Kidney disease Paternal Grandfather    Colon cancer Maternal Aunt 35   Colon cancer Cousin    Stomach cancer Neg Hx    Pancreatic cancer Neg Hx    Esophageal cancer Neg Hx    Social History   Socioeconomic History   Marital status: Married    Spouse name: Davie   Number of children: 2   Years of education: 16   Highest education level: Bachelor's degree (e.g., BA, AB, BS)  Occupational History   Occupation: sports administrator  Tobacco Use   Smoking status: Never   Smokeless tobacco: Never  Vaping Use   Vaping status: Never Used  Substance and Sexual Activity   Alcohol use: Yes    Comment: occasional   Drug use: No   Sexual activity: Yes    Partners: Female    Birth control/protection: None  Other Topics Concern   Not on file  Social History Narrative   Married to [place], 2 children.    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 Drinks caffeine.    Smoke alarm in the home. Wears seatbelt.    Feels safe in his relationships.    Social Drivers of Corporate Investment Banker Strain: Low Risk  (07/17/2022)   Overall Financial Resource Strain (CARDIA)    Difficulty of Paying Living Expenses: Not very hard  Food Insecurity: No Food Insecurity (07/17/2022)   Hunger Vital Sign    Worried About Running Out of Food in the Last Year: Never true    Ran Out of Food  in the Last Year: Never true  Transportation Needs: No Transportation Needs (07/17/2022)   PRAPARE - Administrator, Civil Service (Medical): No    Lack of Transportation (Non-Medical): No  Physical Activity: Sufficiently Active (07/17/2022)   Exercise Vital Sign    Days of Exercise per Week: 7 days    Minutes of Exercise per Session: 60 min  Stress: No Stress Concern Present (07/17/2022)   [employer] of Occupational Health - Occupational Stress Questionnaire    Feeling of Stress : Not at all  Social Connections: Socially Integrated (07/17/2022)   Social Connection and Isolation Panel    Frequency of Communication with Friends and Family: More than three times a week    Frequency of Social Gatherings with Friends and Family: Once a week    Attends Religious Services: More than 4 times per year    Active Member of [employer] or Organizations:  Yes    Attends Club or Organization Meetings: More than 4 times per year    Marital Status: Married  Intimate Partner Violence: Not on file   Allergies as of 02/25/2024       Reactions   Amoxicillin Rash   Penicillins Rash        Medication List        Accurate as of February 25, 2024 12:07 PM. If you have any questions, ask your nurse or doctor.          STOP taking these medications    Zepbound  15 MG/0.5ML Pen Generic drug: tirzepatide  Stopped by: Charlies Bellini       TAKE these medications    amLODipine  5 MG tablet Commonly known as: NORVASC  Take 1 tablet (5 mg total) by mouth in the morning and at bedtime.   atorvastatin  40 MG tablet Commonly known as: LIPITOR Take 1 tablet (40 mg total) by mouth daily.   furosemide  20 MG tablet Commonly known as: LASIX  Take 1 tablet (20 mg total) by mouth daily. What changed: how much to take Changed by: Charlies Bellini   spironolactone  50 MG tablet Commonly known as: ALDACTONE  Take 1 tablet (50 mg total) by mouth daily. What changed:  medication strength how much to  take Changed by: Charlies Bellini   valsartan  320 MG tablet Commonly known as: DIOVAN  Take 1 tablet (320 mg total) by mouth daily.        Patient was never admitted.   ROS: 14 pt review of systems performed and negative (unless mentioned in an HPI)  Objective: BP (!) 178/110   Pulse 81   Temp 98 F (36.7 C)   Wt (!) 351 lb 3.2 oz (159.3 kg)   SpO2 98%   BMI 50.39 kg/m   Physical Exam Vitals and nursing note reviewed.  Constitutional:      General: He is not in acute distress.    Appearance: Normal appearance. He is obese. He is not ill-appearing, toxic-appearing or diaphoretic.  HENT:     Head: Normocephalic and atraumatic.  Eyes:     General: No scleral icterus.       Right eye: No discharge.        Left eye: No discharge.     Extraocular Movements: Extraocular movements intact.     Pupils: Pupils are equal, round, and reactive to light.  Cardiovascular:     Rate and Rhythm: Normal rate and regular rhythm.     Heart sounds: No murmur heard. Pulmonary:     Effort: Pulmonary effort is normal. No respiratory distress.     Breath sounds: Normal breath sounds. No wheezing, rhonchi or rales.  Musculoskeletal:     Cervical back: Neck supple.     Right lower leg: Edema present.     Left lower leg: Edema present.     Comments: Trace edema bilaterally  Skin:    General: Skin is warm and dry.     Coloration: Skin is not jaundiced or pale.     Findings: No rash.  Neurological:     Mental Status: He is alert and oriented to person, place, and time. Mental status is at baseline.  Psychiatric:        Mood and Affect: Mood normal.        Behavior: Behavior normal.        Thought Content: Thought content normal.        Judgment: Judgment normal.    No results found for this  or any previous visit (from the past 24 hours).   Assessment/plan: Isabella Ida is a 48 y.o. male present for chronic condition management morbid obesity/hypertension/diastolic heart failure  (HCC)/hyperlipidemia goal less than 70 LDL Patient has not taken his medication today. Continue valsartan  320 mg daily Continue Lasix  at 20-can take 40 mg a day if noticing edema RESTART Spironolactone  50 mg  Consider adding verapamil if needing additional coverage Continue daily baby aspirin. Continue atorvastatin  40 mg daily (he has refused 80 mg ) - Ejection fraction improved, return to normal. LVH, trace MR. - NEGATIVE Thoracic aortic aneurysm --> repeat imaging felt he did not have an  aneurysm and original was artifact.  - prior med: amlodipine  (SE), coreg  (SE), metoprolol  (SE). - Continue folllw-up per routine schedule with cardiology  Discontinue Zepbound  15 mg weekly injection.  Patient's decision Low glycemic diet (strict) recommended.  Avoid all alcohol and drink water only    Return in about 2 weeks (around 03/10/2024) for nurse visit- BP recheck.  And lab appointment for BMP.   Orders Placed This Encounter  Procedures   Basic Metabolic Panel (BMET)   Meds ordered this encounter  Medications   DISCONTD: furosemide  (LASIX ) 20 MG tablet    Sig: Take 2 tablets (40 mg total) by mouth daily.    Dispense:  180 tablet    Refill:  1   atorvastatin  (LIPITOR) 40 MG tablet    Sig: Take 1 tablet (40 mg total) by mouth daily.    Dispense:  90 tablet    Refill:  3   DISCONTD: valsartan  (DIOVAN ) 320 MG tablet    Sig: Take 1 tablet (320 mg total) by mouth daily.    Dispense:  90 tablet    Refill:  3   DISCONTD: spironolactone  (ALDACTONE ) 50 MG tablet    Sig: Take 1 tablet (50 mg total) by mouth daily.    Dispense:  90 tablet    Refill:  1   valsartan  (DIOVAN ) 320 MG tablet    Sig: Take 1 tablet (320 mg total) by mouth daily.    Dispense:  90 tablet    Refill:  1   DISCONTD: spironolactone  (ALDACTONE ) 50 MG tablet    Sig: Take 1 tablet (50 mg total) by mouth daily.    Dispense:  90 tablet    Refill:  1   furosemide  (LASIX ) 20 MG tablet    Sig: Take 1 tablet (20 mg total)  by mouth daily.    Dispense:  90 tablet    Refill:  1   DISCONTD: spironolactone  (ALDACTONE ) 50 MG tablet    Sig: Take 1 tablet (50 mg total) by mouth daily.    Dispense:  90 tablet    Refill:  1   spironolactone  (ALDACTONE ) 50 MG tablet    Sig: Take 1 tablet (50 mg total) by mouth daily.    Dispense:  90 tablet    Refill:  1    Note is dictated utilizing voice recognition software. Although note has been proof read prior to signing, occasional typographical errors still can be missed. If any questions arise, please do not hesitate to call for verification.   Electronically signed by: Charlies Bellini, DO [facility]

## 2024-03-10 ENCOUNTER — Ambulatory Visit

## 2024-03-10 ENCOUNTER — Other Ambulatory Visit

## 2024-03-10 DIAGNOSIS — I10 Essential (primary) hypertension: Secondary | ICD-10-CM

## 2024-03-10 LAB — BASIC METABOLIC PANEL WITH GFR
BUN: 16 mg/dL (ref 6–23)
CO2: 31 meq/L (ref 19–32)
Calcium: 9.5 mg/dL (ref 8.4–10.5)
Chloride: 97 meq/L (ref 96–112)
Creatinine, Ser: 1.14 mg/dL (ref 0.40–1.50)
GFR: 76.71 mL/min (ref >60.00)
Glucose, Bld: 141 mg/dL — ABNORMAL HIGH (ref 70–99)
Potassium: 4.1 meq/L (ref 3.5–5.1)
Sodium: 135 meq/L (ref 135–145)

## 2024-03-10 NOTE — Progress Notes (Unsigned)
 Darren Carlson is a 48 y.o. male presents to the office today for Blood pressure recheck   Blood pressure medication: valsartan  320 mg daily If on medication, Last dose was at least 1-2 hours prior to recheck: Yes Blood pressure was taken in the left arm after patient rested for 5 minutes between both readings.   1st: 150/96 2nd: 142/90   Darren Carlson

## 2024-03-11 ENCOUNTER — Ambulatory Visit: Payer: Self-pay | Admitting: Family Medicine

## 2024-03-11 MED ORDER — VERAPAMIL HCL ER 180 MG PO TBCR: 180.0000 mg | EXTENDED_RELEASE_TABLET | Freq: Every day | ORAL | 1 refills | Status: AC

## 2024-03-11 NOTE — Addendum Note (Signed)
 Addended by: Brently Voorhis A on: 03/11/2024 04:30 PM   Modules accepted: Orders, Level of Service

## 2024-03-11 NOTE — Progress Notes (Signed)
 LM for pt to return call to discuss.

## 2024-03-12 NOTE — Progress Notes (Signed)
 LVM to discuss. Sent MyChart.

## 2024-03-15 DIAGNOSIS — Z1211 Encounter for screening for malignant neoplasm of colon: Secondary | ICD-10-CM | POA: Diagnosis not present

## 2024-03-24 LAB — COLOGUARD: COLOGUARD: NEGATIVE

## 2024-03-25 ENCOUNTER — Ambulatory Visit: Payer: Self-pay | Admitting: Family Medicine

## 2024-03-26 ENCOUNTER — Encounter: Payer: Self-pay | Admitting: Family Medicine

## 2024-04-16 ENCOUNTER — Encounter: Payer: Self-pay | Admitting: Family Medicine

## 2024-04-27 ENCOUNTER — Other Ambulatory Visit: Payer: Self-pay

## 2024-04-27 MED ORDER — FUROSEMIDE 20 MG PO TABS: 20.0000 mg | ORAL_TABLET | Freq: Every day | ORAL | 0 refills | Status: AC
